# Patient Record
Sex: Male | Born: 1935 | Race: White | Hispanic: No | Marital: Married | Smoking: Former smoker
Health system: Southern US, Community
[De-identification: ages and names within clinical notes are randomized; demographics above are authoritative.]

## PROBLEM LIST (undated history)

## (undated) DIAGNOSIS — H269 Unspecified cataract: Secondary | ICD-10-CM

## (undated) DIAGNOSIS — R233 Spontaneous ecchymoses: Secondary | ICD-10-CM

## (undated) DIAGNOSIS — C801 Malignant (primary) neoplasm, unspecified: Secondary | ICD-10-CM

## (undated) DIAGNOSIS — J189 Pneumonia, unspecified organism: Secondary | ICD-10-CM

## (undated) DIAGNOSIS — N189 Chronic kidney disease, unspecified: Secondary | ICD-10-CM

## (undated) DIAGNOSIS — H9319 Tinnitus, unspecified ear: Secondary | ICD-10-CM

## (undated) DIAGNOSIS — T8859XA Other complications of anesthesia, initial encounter: Secondary | ICD-10-CM

## (undated) DIAGNOSIS — T4145XA Adverse effect of unspecified anesthetic, initial encounter: Secondary | ICD-10-CM

## (undated) DIAGNOSIS — N390 Urinary tract infection, site not specified: Secondary | ICD-10-CM

## (undated) DIAGNOSIS — Z860100 Personal history of colon polyps, unspecified: Secondary | ICD-10-CM

## (undated) DIAGNOSIS — K5900 Constipation, unspecified: Secondary | ICD-10-CM

## (undated) DIAGNOSIS — Z8739 Personal history of other diseases of the musculoskeletal system and connective tissue: Secondary | ICD-10-CM

## (undated) DIAGNOSIS — K5792 Diverticulitis of intestine, part unspecified, without perforation or abscess without bleeding: Secondary | ICD-10-CM

## (undated) DIAGNOSIS — R252 Cramp and spasm: Secondary | ICD-10-CM

## (undated) DIAGNOSIS — M1990 Unspecified osteoarthritis, unspecified site: Secondary | ICD-10-CM

## (undated) DIAGNOSIS — R238 Other skin changes: Secondary | ICD-10-CM

## (undated) DIAGNOSIS — H9190 Unspecified hearing loss, unspecified ear: Secondary | ICD-10-CM

## (undated) DIAGNOSIS — M2540 Effusion, unspecified joint: Secondary | ICD-10-CM

## (undated) DIAGNOSIS — R609 Edema, unspecified: Secondary | ICD-10-CM

## (undated) DIAGNOSIS — R600 Localized edema: Secondary | ICD-10-CM

## (undated) DIAGNOSIS — M2550 Pain in unspecified joint: Secondary | ICD-10-CM

## (undated) HISTORY — PX: PARTIAL COLECTOMY: SHX5273

## (undated) HISTORY — DX: Unspecified osteoarthritis, unspecified site: M19.90

## (undated) HISTORY — DX: Malignant (primary) neoplasm, unspecified: C80.1

## (undated) HISTORY — DX: Chronic kidney disease, unspecified: N18.9

## (undated) HISTORY — PX: HAND SURGERY: SHX662

## (undated) HISTORY — PX: COLONOSCOPY: SHX174

## (undated) HISTORY — PX: OTHER SURGICAL HISTORY: SHX169

---
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
## 1968-01-12 HISTORY — PX: HEMORRHOID SURGERY: SHX153

## 1998-12-16 ENCOUNTER — Encounter (INDEPENDENT_AMBULATORY_CARE_PROVIDER_SITE_OTHER): Payer: Self-pay | Admitting: *Deleted

## 1998-12-16 ENCOUNTER — Ambulatory Visit (HOSPITAL_COMMUNITY): Admission: RE | Admit: 1998-12-16 | Discharge: 1998-12-16 | Payer: Self-pay | Admitting: Gastroenterology

## 1999-04-29 ENCOUNTER — Encounter: Payer: Self-pay | Admitting: Orthopedic Surgery

## 1999-04-29 ENCOUNTER — Encounter: Admission: RE | Admit: 1999-04-29 | Discharge: 1999-04-29 | Payer: Self-pay | Admitting: Orthopedic Surgery

## 1999-07-24 ENCOUNTER — Encounter: Payer: Self-pay | Admitting: Gastroenterology

## 1999-07-24 ENCOUNTER — Ambulatory Visit (HOSPITAL_COMMUNITY): Admission: RE | Admit: 1999-07-24 | Discharge: 1999-07-24 | Payer: Self-pay | Admitting: Gastroenterology

## 1999-09-17 ENCOUNTER — Encounter: Admission: RE | Admit: 1999-09-17 | Discharge: 1999-09-17 | Payer: Self-pay | Admitting: Surgery

## 1999-09-17 ENCOUNTER — Encounter: Payer: Self-pay | Admitting: Surgery

## 1999-09-30 ENCOUNTER — Encounter (INDEPENDENT_AMBULATORY_CARE_PROVIDER_SITE_OTHER): Payer: Self-pay | Admitting: Specialist

## 1999-09-30 ENCOUNTER — Inpatient Hospital Stay (HOSPITAL_COMMUNITY): Admission: RE | Admit: 1999-09-30 | Discharge: 1999-10-10 | Payer: Self-pay | Admitting: Surgery

## 1999-10-06 ENCOUNTER — Encounter: Payer: Self-pay | Admitting: Surgery

## 2000-07-20 ENCOUNTER — Encounter: Admission: RE | Admit: 2000-07-20 | Discharge: 2000-07-20 | Payer: Self-pay | Admitting: Gastroenterology

## 2000-07-20 ENCOUNTER — Encounter: Payer: Self-pay | Admitting: Gastroenterology

## 2000-07-25 ENCOUNTER — Encounter: Admission: RE | Admit: 2000-07-25 | Discharge: 2000-07-25 | Payer: Self-pay | Admitting: Gastroenterology

## 2000-07-25 ENCOUNTER — Encounter: Payer: Self-pay | Admitting: Gastroenterology

## 2001-01-11 DIAGNOSIS — K5792 Diverticulitis of intestine, part unspecified, without perforation or abscess without bleeding: Secondary | ICD-10-CM

## 2001-01-11 HISTORY — DX: Diverticulitis of intestine, part unspecified, without perforation or abscess without bleeding: K57.92

## 2001-07-10 ENCOUNTER — Ambulatory Visit (HOSPITAL_COMMUNITY): Admission: RE | Admit: 2001-07-10 | Discharge: 2001-07-10 | Payer: Self-pay | Admitting: Gastroenterology

## 2001-07-10 ENCOUNTER — Encounter: Payer: Self-pay | Admitting: Gastroenterology

## 2001-08-04 ENCOUNTER — Emergency Department (HOSPITAL_COMMUNITY): Admission: EM | Admit: 2001-08-04 | Discharge: 2001-08-04 | Payer: Self-pay | Admitting: Emergency Medicine

## 2001-08-07 ENCOUNTER — Encounter: Admission: RE | Admit: 2001-08-07 | Discharge: 2001-08-07 | Payer: Self-pay | Admitting: Urology

## 2001-08-07 ENCOUNTER — Encounter: Payer: Self-pay | Admitting: Urology

## 2001-12-12 ENCOUNTER — Ambulatory Visit (HOSPITAL_COMMUNITY): Admission: RE | Admit: 2001-12-12 | Discharge: 2001-12-12 | Payer: Self-pay | Admitting: Gastroenterology

## 2001-12-12 ENCOUNTER — Encounter (INDEPENDENT_AMBULATORY_CARE_PROVIDER_SITE_OTHER): Payer: Self-pay | Admitting: *Deleted

## 2003-01-12 DIAGNOSIS — C801 Malignant (primary) neoplasm, unspecified: Secondary | ICD-10-CM

## 2003-01-12 HISTORY — DX: Malignant (primary) neoplasm, unspecified: C80.1

## 2003-09-25 ENCOUNTER — Ambulatory Visit (HOSPITAL_COMMUNITY): Admission: RE | Admit: 2003-09-25 | Discharge: 2003-09-25 | Payer: Self-pay | Admitting: Urology

## 2003-09-30 ENCOUNTER — Ambulatory Visit: Admission: RE | Admit: 2003-09-30 | Discharge: 2003-12-29 | Payer: Self-pay | Admitting: Radiation Oncology

## 2003-10-07 ENCOUNTER — Ambulatory Visit (HOSPITAL_COMMUNITY): Admission: RE | Admit: 2003-10-07 | Discharge: 2003-10-07 | Payer: Self-pay | Admitting: Urology

## 2003-12-27 ENCOUNTER — Encounter: Admission: RE | Admit: 2003-12-27 | Discharge: 2003-12-27 | Payer: Self-pay | Admitting: Urology

## 2003-12-31 ENCOUNTER — Ambulatory Visit (HOSPITAL_BASED_OUTPATIENT_CLINIC_OR_DEPARTMENT_OTHER): Admission: RE | Admit: 2003-12-31 | Discharge: 2003-12-31 | Payer: Self-pay | Admitting: Urology

## 2003-12-31 ENCOUNTER — Ambulatory Visit (HOSPITAL_COMMUNITY): Admission: RE | Admit: 2003-12-31 | Discharge: 2003-12-31 | Payer: Self-pay | Admitting: Urology

## 2004-01-08 ENCOUNTER — Ambulatory Visit: Admission: RE | Admit: 2004-01-08 | Discharge: 2004-01-09 | Payer: Self-pay | Admitting: Radiation Oncology

## 2007-08-11 ENCOUNTER — Encounter: Admission: RE | Admit: 2007-08-11 | Discharge: 2007-08-11 | Payer: Self-pay | Admitting: Gastroenterology

## 2008-03-20 ENCOUNTER — Encounter (INDEPENDENT_AMBULATORY_CARE_PROVIDER_SITE_OTHER): Payer: Self-pay | Admitting: Surgery

## 2008-03-20 ENCOUNTER — Inpatient Hospital Stay (HOSPITAL_COMMUNITY): Admission: RE | Admit: 2008-03-20 | Discharge: 2008-03-26 | Payer: Self-pay | Admitting: Surgery

## 2010-02-01 ENCOUNTER — Encounter: Payer: Self-pay | Admitting: Urology

## 2010-04-23 LAB — COMPREHENSIVE METABOLIC PANEL
ALT: 25 U/L (ref 0–53)
AST: 20 U/L (ref 0–37)
Albumin: 4 g/dL (ref 3.5–5.2)
Alkaline Phosphatase: 43 U/L (ref 39–117)
BUN: 19 mg/dL (ref 6–23)
CO2: 27 mEq/L (ref 19–32)
Calcium: 9.3 mg/dL (ref 8.4–10.5)
Chloride: 106 mEq/L (ref 96–112)
Creatinine, Ser: 1.44 mg/dL (ref 0.4–1.5)
GFR calc Af Amer: 58 mL/min — ABNORMAL LOW (ref >60)
GFR calc non Af Amer: 48 mL/min — ABNORMAL LOW (ref >60)
Glucose, Bld: 114 mg/dL — ABNORMAL HIGH (ref 70–99)
Potassium: 3.8 mEq/L (ref 3.5–5.1)
Sodium: 137 mEq/L (ref 135–145)
Total Bilirubin: 0.8 mg/dL (ref 0.3–1.2)
Total Protein: 6.9 g/dL (ref 6.0–8.3)

## 2010-04-23 LAB — CBC
HCT: 44.8 % (ref 39.0–52.0)
Hemoglobin: 15.1 g/dL (ref 13.0–17.0)
MCHC: 33.6 g/dL (ref 30.0–36.0)
MCV: 90.5 fL (ref 78.0–100.0)
Platelets: 252 10*3/uL (ref 150–400)
RBC: 4.95 MIL/uL (ref 4.22–5.81)
RDW: 13.2 % (ref 11.5–15.5)
WBC: 6.9 10*3/uL (ref 4.0–10.5)

## 2010-04-23 LAB — DIFFERENTIAL
Basophils Absolute: 0 10*3/uL (ref 0.0–0.1)
Basophils Relative: 0 % (ref 0–1)
Eosinophils Absolute: 0.1 10*3/uL (ref 0.0–0.7)
Eosinophils Relative: 2 % (ref 0–5)
Lymphocytes Relative: 40 % (ref 12–46)
Lymphs Abs: 2.7 10*3/uL (ref 0.7–4.0)
Monocytes Absolute: 0.6 10*3/uL (ref 0.1–1.0)
Monocytes Relative: 8 % (ref 3–12)
Neutro Abs: 3.5 10*3/uL (ref 1.7–7.7)
Neutrophils Relative %: 50 % (ref 43–77)

## 2010-04-23 LAB — URINALYSIS, ROUTINE W REFLEX MICROSCOPIC
Bilirubin Urine: NEGATIVE
Glucose, UA: NEGATIVE mg/dL
Hgb urine dipstick: NEGATIVE
Ketones, ur: NEGATIVE mg/dL
Nitrite: NEGATIVE
Protein, ur: NEGATIVE mg/dL
Specific Gravity, Urine: 1.015 (ref 1.005–1.030)
Urobilinogen, UA: 0.2 mg/dL (ref 0.0–1.0)
pH: 5 (ref 5.0–8.0)

## 2010-05-26 NOTE — Op Note (Signed)
NAMEJOHNPAUL, GILLENTINE                ACCOUNT NO.:  [id number]   MEDICAL RECORD NO.:  [id number]          PATIENT TYPE:  INP   LOCATION:  0009                         FACILITY:  [facility]   PHYSICIAN:  Currie Paris, M.D.DATE OF BIRTH:  1928-11-15   DATE OF PROCEDURE:  03/20/2008  DATE OF DISCHARGE:                               OPERATIVE REPORT   PREOPERATIVE DIAGNOSIS:  Polyp in ascending colon.   POSTOPERATIVE DIAGNOSIS:  Polyp in ascending colon.   OPERATION:  Resection of terminal ileum and ascending colon with primary  anastomosis.   SURGEON:  Currie Paris, M.D.   ASSISTANT:  Dr. Harriette Bouillon.   ANESTHESIA:  General endotracheal.   CLINICAL HISTORY:  Mr. Tosh is a 77 year old gentleman who on  colonoscopy have a large polyp of the cecum that was not able to be  removed colonoscopically.  Biopsy indicated it to be benign.  After  discussion with the patient and with Dr. Sherin Quarry, his physician, we  elected to proceed to a right colectomy.   DESCRIPTION OF PROCEDURE:  The patient was seen in the holding area and  he had no further questions.  We confirmed removal of the cecal area as  the planned procedure.   He was taken to the operating room and after satisfactory general  endotracheal anesthesia had been obtained, Foley catheter was placed and  the abdomen was clipped, prepped and draped.  The time-out was done.   The patient had an old prior midline incision that was basically from  xiphoid to pubis so I elected to make a right midabdomen transverse  incision.  The subcu tissue and muscle were divided with cautery and the  peritoneum picked up and entered.  There no significant adhesions and we  had free entry into the peritoneal cavity.   I extended the incision a little bit laterally to get a little bit more  exposure, since the patient is somewhat large.  I was then able to  identify the cecum and with some retraction, opened up the white line of  Toldt and mobilize the cecum up into the wound.  I continued up to  mobilize little bit of the hepatic flexure but my plan was just to do a  cecal resection in the end of the terminal ileum so that we could do a  primary anastomosis since this appeared to be benign and appeared to be  located near the ileocecal valve.   I used the LigaSure to divide some of the attachments to mobilize the  right colon and then mobilized the cecum and there some adhesions of the  terminal ileum which were divided.  With the colon retracted up into the  wound I had plenty of mobility to do an anastomosis.  I identified the  right ureter made sure it was well posterior to where we were working.   I selected a point to divide the small bowel just proximal to the  ileocecal valve and a point to develop the ascending colon about the mid  ascending colon.   Using the LigaSure, I divided the intervening  mesentery staying fairly  close to the colon and taking small bites.   Once that was done, I was able to line up the antimesenteric border of  the small bowel with the tinea on the colon.  I opened both and put the  GIA in and fired that.  There was no bleeding at the anastomosis.   The TA-60 was used to come across all layers to close the common defect.  The colon was cut off.   I used a few 3-0 Vicryls to invert the TA staple line.  I anchored the  end of the anastomosis crotch stitch with a suture.  I closed the  mesentery some with 3-0 Vicryl.   I opened the specimen off on the back table and confirmed the presence  of about 2.5 to 3 cm flat sessile polyp adjacent to the ileocecal valve  near the area where the appendiceal lumen would be.   Using new instruments, gloves etc., I went back and irrigated, made sure  everything was dry.  The bowel was carefully replaced in the abdominal  cavity.  Incision was closed in layers with #1 PDS running.  We  irrigated between layers.  The skin was closed with  staples and I used  some Telfa wicks as well.   The patient tolerated the procedure well.  Estimated blood loss was less  than 100 mL.  There no operative complications.  All counts were  correct.      Currie Paris, M.D.  Electronically Signed     CJS/MEDQ  D:  03/20/2008  T:  03/21/2008  Job:  [id number]   cc:   Tasia Catchings, M.D.  Fax: [phone]

## 2010-05-29 NOTE — Discharge Summary (Signed)
[facility]  Patient:    Christopher [name], Christopher [name]                         MRN: [id number] Adm. Date:  [id number] Disc. Date: [id number] Attending:  Charlton Haws CC:         Genene Churn. Sherin Quarry, M.D.   Discharge Summary  FINAL DIAGNOSES: 1. Chronic diverticulitis of the sigmoid colon. 2. Superficial thrombophlebitis of upper extremity. 3. Obesity.  CLINICAL HISTORY:  This patient is a 77 year old gentleman whose had multiple episodes of diverticulitis admitted electively for sigmoid colectomy.  HOSPITAL COURSE:  The patient was admitted and taken to the operating room on September 19 where he had resection of most of his descending and sigmoid colon with a primary anastomosis. He tolerated the procedure well. He remained stable except for the low-grade fever thought secondary to pulmonary but was minimal. He was a little slow opening up secondary to the significant amount of resection that was done I think and was begun on Reglan on the fifth postoperative day. His laboratory studies remained unremarkable and he finally began passing some liquid. He developed a superficial phlebitis at an IV site and his antecubital area and was begun on some warm soaks and Ancef but this never proved to be any significant problem. By the seventh postoperative day, he began having bowel movements. NG was out and he was started on liquids on the eighth postoperative day, advanced his diet and was discharged on September 29 feeling well, afebrile, wound healing. He was able to be discharged on Vicodin for pain to return to the office in 10-14 days with limited activities.  Pathology report showed diverticulitis with focal and mid chronic diverticulitis.  Laboratory studies included a preoperative hemoglobin of 14.8 and postoperative 13.4. Electrolytes were unremarkable. Albumin was 3.8 on admission. Liver functions were normal. Postoperative chest x-ray was unremarkable  and abdominal films showed no significant obstructive pattern done when he was a little slow opening up. DD:  10/26/99 TD:  10/26/99 Job: [id number]

## 2010-05-29 NOTE — Discharge Summary (Signed)
Christopher [name], Christopher [name]                ACCOUNT NO.:  [id number]   MEDICAL RECORD NO.:  [id number]          PATIENT TYPE:  INP   LOCATION:  1526                         FACILITY:  [facility]   PHYSICIAN:  Currie Paris, M.D.DATE OF BIRTH:  Aug 29, 1928   DATE OF ADMISSION:  03/20/2008  DATE OF DISCHARGE:  03/26/2008                               DISCHARGE SUMMARY   FINAL DIAGNOSES:  1. Tubovillous adenoma of cecum.  2. Morbid obesity.   CLINICAL HISTORY:  [name] is a  77 year old gentleman who recently  had a colonoscopy demonstrating a large polyp of the cecum thought to be  benign.  He has had a previous left colectomy for diverticular disease.  The polyp was too large to be removed colonoscopically.   HOSPITAL COURSE:  The patient was admitted on March 20, 2008, and taken  to the operating room where a resection of the terminal ileum and  ascending colon with a primary anastomosis was performed.  Postoperatively, he had a relatively benign course.  He began passing  gas and had had 2 bowel movements by the 4th postoperative day.  His  wound was clean and some of the wicks were removed that we had placed.  He continued to have a little bit of nausea later that day but by  postoperative day 6 was feeling better and able to be discharged.  He  was having some loose stools.  His abdomen was soft and nontender.  His  wounds looked okay.  Pathology report was noted and had been discussed  with the patient prior to discharge.   Postoperatively, he will be followed up in our office.  He will also  make long-term followups with Dr. Tasia Catchings.   OTHER LABORATORY STUDIES NOTED THIS ADMISSION:  Included an admission  hemoglobin of 15 with a white count of 6900.  Blood chemistries were  normal.  Nonfasting glucose was 114.  Urinalysis was clear.  Preoperative chest x-ray was likewise negative.      Currie Paris, M.D.  Electronically Signed     CJS/MEDQ  D:  04/10/2008   T:  04/10/2008  Job:  [id number]   cc:   Tasia Catchings, M.D.  Fax: [phone]

## 2010-05-29 NOTE — Op Note (Signed)
[name], [name]                ACCOUNT NO.:  [id number]   MEDICAL RECORD NO.:  [id number]          PATIENT TYPE:  AMB   LOCATION:  NESC                         FACILITY:  [facility]   PHYSICIAN:  Jamison Neighbor, M.D.  DATE OF BIRTH:  August 16, 1928   DATE OF PROCEDURE:  12/31/2003  DATE OF DISCHARGE:                                 OPERATIVE REPORT   SERVICE:  Urology.   PREOPERATIVE DIAGNOSES:  Carcinoma of the prostate.   POSTOPERATIVE DIAGNOSES:  Carcinoma of the prostate.   PROCEDURE:  Radioactive seed implantation.   SURGEON:  Jamison Neighbor, M.D.   RADIATION ONCOLOGIST:  Wynn Banker, M.D.   ANESTHESIA:  General.   COMPLICATIONS:  None.   DRAINS:  16 French Foley catheter.   BRIEF HISTORY:  This 77 year old male is known to have biopsy proven  carcinoma of the prostate. The patient has had a Gleason score of 7  involving the right hand side of the prostate, the left hand side was  normal.  Some focal __________ invasion was noted. This involved 60% of the  biopsy specimen. Because this was relatively extensive disease and a Gleason  score of 7, he was staged further with a CT scan and a bone scan. This  showed no evidence of any local disease or distant metastatic disease and it  was felt that the patient was a candidate for potentially curative therapy.  The patient was given the pros and cons of treatment and discussion included  observation, radiation therapy, radiation seed implantation, combination  therapy, surgery or cryosurgery. The patient had a chance to review all the  options and elected to undergo radiation therapy.  A decision was made to  proceed with combined external beam approach followed now by radiation seed  implantation.  The patient is scheduled to have a total of 64 seeds inserted  with a targeted dose of 8000 cGy.  I125 seeds have been selected. The  patient has been fully prepared for the procedure.  He gave full and  informed  consent.   DESCRIPTION OF PROCEDURE:  After successful induction of general anesthesia,  the patient was placed in the dorsal lithotomy position, prepped with  Betadine and draped in the usual sterile fashion.  The genitalia were taped  up and away from the perineum.  The transrectal ultrasound was inserted and  was attached to the step system. The previously obtained ultrasound images  were then utilized to ensure that the patient was appropriately positioned  into the exact same position.  The template was modified based on the  radiation concentrations obtained on the computerized system, minor  modifications were made by Dr. Dan Humphreys. The patient then underwent  insertion of a total of 16 needles with a total 64 seeds.  These were placed  using both ultrasound and fluoroscopic guidance. The patient subsequently  underwent a cystoscopic evaluation.  His urethra was visualized in its  entirety and found to be normal.  Beyond the verumontanum, there was  prostatic enlargement but no real obstruction of the prostatic urethra.  The  urethra and the  prostate itself were inspected with both 12 degree and 7  degree lenses.  The ureters were identified and were normal.  There was no  evidence of any injury to the bladder and certainly no signs of any seeds or  Vicryl strands anywhere within the bladder. The followup x-ray image showed  good placement of the seeds but no evidence of any seeds in the area of the  bladder. The ultrasound image also showed what appeared to be good placement  throughout the prostate with good sparing of the urethra. The patient's  Foley catheter was placed to straight drainage, he tolerated the procedure  well and was taken to the recovery room in good condition.      RJE/MEDQ  D:  12/31/2003  T:  12/31/2003  Job:  [id number]   cc:   Tasia Catchings, M.D.  [address]  [place]  [place] [address]  Fax: [phone]   Wynn Banker, M.D.  [address] [facility]  [place]  [place] [address]  Fax: [phone]

## 2010-05-29 NOTE — Op Note (Signed)
[facility]  Patient:    Christopher [name], Christopher [name]                         MRN: [id number] Proc. Date: 09/30/99 Adm. Date:  [id number] Attending:  Charlton Haws CC:         Genene Churn. Sherin Quarry, M.D.                           Operative Report  OFFICE MEDICAL NUMBER:  [id number]  PREOPERATIVE DIAGNOSIS:  Diverticular disease of descending and sigmoid colon with prior episodes of diverticulitis.  POSTOPERATIVE DIAGNOSIS:  Diverticular disease of descending and sigmoid colon with prior episodes of diverticulitis.  OPERATION:  Resection of descending and sigmoid colon with primary transverse colon to rectal anastomosis.  SURGEON:  Currie Paris, M.D.  ASSISTANT:  Gita Kudo, M.D.  ANESTHESIA:  General endotracheal.  CLINICAL HISTORY:  This patient has had several episodes of diverticular disease treated with antibiotics.  Barium enema has confirmed disease extending well up into the descending colon.  After discussion with the patient and also with Dr. Sherin Quarry, his gastroenterologist, he elected to proceed to sigmoid and descending colectomy.  DESCRIPTION OF PROCEDURE:  The patient was brought to the operating room and after satisfactory general endotracheal anesthesia had been obtained, the abdomen was prepped and draped.  A long midline incision was made from mid epigastrium to symphysis and the peritoneal cavity entered.  The sigmoid was markedly involved with diverticular disease, and this appeared to extend up into the descending colon confirming the barium enema results.  It was clear I was not going to be able to get this down without taking down the splenic flexure, and so I made plans to mobilize the splenic flexure.  The patient is quite large, and I elected to go ahead and open the incision further, extending it up close to the xiphoid.  A self-retaining Bookwalter retractor was used as well as a Environmental manager.  The descending colon was  mobilized, freeing up along the white line of Toldt and then freeing some of the perineal attachments coming around the splenic flexure.  I got about 2/3 of the way around the splenic flexure and was having difficulty with visualization, so I stopped, turned my attention back towards the left transverse colon.  The omentum was taken off the left transverse colon, and I began then to free the left transverse colon up, heading up towards the splenic flexure so that I could approach the splenic flexure from both the transverse colon and the descending colon.  Using clips, cautery, and ties, I eventually freed up the entire splenic flexure and got it mobilized well down so that it was in the wound easily.  I elected to divide the colon from the proximal point in the distal transverse where I had an area that appeared to be clean and free of diverticular disease and also which would go down towards the rectum easily and without tension.  I divided that initially with the TIA.  At this point, I then divided the colon mesentery, staying fairly close to the colon and using Kelly clamps and ties, working my way down to the proximal rectum.  I stayed well out of the retroperitoneum and did not dissect out the ureters, as I really did not get into that area whatsoever.  I came up on the colon and had a good area to do the  anastomosis here and thought it was fairly dilated, and I would have a relatively easy time doing an end-to-end stapled anastomosis.  I divided the colon here between bowel clamps and sent the specimen off.  I then stopped and made sure that everything appeared to be dry up in the left side of the abdomen and around the splenic flexure, irrigated out, and found a few points that were oozing slowly that were cauterized, but no significant bleeding identified.  I left a couple of packs in the left upper quadrant while I proceeded with the anastomosis.  The two ends of the bowel  overlapped by several inches so there would be no tension on the anastomosis.  I cleaned off about 2-3 more cm of the transverse colon to get a little better area freed up to do the anastomosis and then divided the colon again with a ______ bowel clamp.  The two ends came together nicely.  A stapled anastomosis was done.  I took the clamps off and used some 3-0 silks to tack the back walls together, keeping the mesentery to the mesentery for proper orientation.  The TA 60 stapler and 3.5 mm stapler was fired to complete the back row, and this appeared to be intact.  Two additional firings were used to complete the front rows, dividing the area into two segments.  This completed a nice, widely patent anastomosis that appeared to be dry and intact.  Gloves and instruments were changed here.  I then re-irrigated and checked the whole left side again for bleeders to make sure where we took the mesentery down and up by the splenic flexure, everything appeared to be dry, and there was no sign of any bleeding, and the packs were essentially dry, showing no evidence of any active bleeding during the period of time we were doing the anastomosis.  Packs were removed, and the colon laid along the lateral gutter, and the mesentery tacked down posteriorly so there would no place for an internal hernia to develop.  The NG was positioned in the stomach.  The small bowel had appeared to be normal, as it was run at the beginning of the case, and the liver and gallbladder appeared to be normal as did the spleen.  No other abnormalities being noted, the abdomen was then closed.  I used a #1 PDS to close the peritoneum below the umbilicus and then a running 0 Prolene double loop suture to close the fascia.  The wound was irrigated again and the skin closed with staples.  The patient tolerated the procedure well.  There were no complications.  Operative blood loss was approximately 500 cc. DD:   09/30/99 TD:  10/01/99 Job: [id number]

## 2010-08-10 ENCOUNTER — Other Ambulatory Visit: Payer: Self-pay | Admitting: Gastroenterology

## 2010-08-12 HISTORY — PX: TOTAL KNEE ARTHROPLASTY: SHX125

## 2010-08-13 ENCOUNTER — Ambulatory Visit (HOSPITAL_COMMUNITY)
Admission: RE | Admit: 2010-08-13 | Discharge: 2010-08-13 | Disposition: A | Discharge status: Home / Self Care | Payer: Medicare Other | Source: Ambulatory Visit | Attending: Orthopedic Surgery | Admitting: Orthopedic Surgery

## 2010-08-13 ENCOUNTER — Encounter (HOSPITAL_COMMUNITY): Payer: Medicare Other

## 2010-08-13 ENCOUNTER — Other Ambulatory Visit: Payer: Self-pay | Admitting: Orthopedic Surgery

## 2010-08-13 ENCOUNTER — Other Ambulatory Visit (HOSPITAL_COMMUNITY): Payer: Self-pay | Admitting: Orthopedic Surgery

## 2010-08-13 DIAGNOSIS — Z01818 Encounter for other preprocedural examination: Secondary | ICD-10-CM | POA: Insufficient documentation

## 2010-08-13 DIAGNOSIS — M1710 Unilateral primary osteoarthritis, unspecified knee: Secondary | ICD-10-CM | POA: Insufficient documentation

## 2010-08-13 DIAGNOSIS — Z01811 Encounter for preprocedural respiratory examination: Secondary | ICD-10-CM

## 2010-08-13 DIAGNOSIS — I700 Atherosclerosis of aorta: Secondary | ICD-10-CM | POA: Insufficient documentation

## 2010-08-13 DIAGNOSIS — M47814 Spondylosis without myelopathy or radiculopathy, thoracic region: Secondary | ICD-10-CM | POA: Insufficient documentation

## 2010-08-13 DIAGNOSIS — Z01810 Encounter for preprocedural cardiovascular examination: Secondary | ICD-10-CM | POA: Insufficient documentation

## 2010-08-13 DIAGNOSIS — Z01812 Encounter for preprocedural laboratory examination: Secondary | ICD-10-CM | POA: Insufficient documentation

## 2010-08-13 LAB — COMPREHENSIVE METABOLIC PANEL
ALT: 21 U/L (ref 0–53)
AST: 18 U/L (ref 0–37)
Albumin: 4 g/dL (ref 3.5–5.2)
BUN: 22 mg/dL (ref 6–23)
CO2: 31 mEq/L (ref 19–32)
Calcium: 10.2 mg/dL (ref 8.4–10.5)
Chloride: 102 mEq/L (ref 96–112)
GFR calc Af Amer: 53 mL/min — ABNORMAL LOW (ref >60)
GFR calc non Af Amer: 43 mL/min — ABNORMAL LOW (ref >60)
Glucose, Bld: 95 mg/dL (ref 70–99)
Potassium: 3.4 mEq/L — ABNORMAL LOW (ref 3.5–5.1)
Sodium: 139 mEq/L (ref 135–145)
Total Bilirubin: 0.4 mg/dL (ref 0.3–1.2)
Total Protein: 7.4 g/dL (ref 6.0–8.3)

## 2010-08-13 LAB — DIFFERENTIAL
Basophils Relative: 1 % (ref 0–1)
Eosinophils Absolute: 0.1 10*3/uL (ref 0.0–0.7)
Eosinophils Relative: 2 % (ref 0–5)
Lymphs Abs: 3 10*3/uL (ref 0.7–4.0)
Monocytes Absolute: 0.7 10*3/uL (ref 0.1–1.0)
Monocytes Relative: 9 % (ref 3–12)
Neutro Abs: 3.8 10*3/uL (ref 1.7–7.7)
Neutrophils Relative %: 50 % (ref 43–77)

## 2010-08-13 LAB — CBC
HCT: 43.6 % (ref 39.0–52.0)
Hemoglobin: 14.6 g/dL (ref 13.0–17.0)
MCH: 29.6 pg (ref 26.0–34.0)
MCHC: 33.5 g/dL (ref 30.0–36.0)
MCV: 88.4 fL (ref 78.0–100.0)
Platelets: 235 10*3/uL (ref 150–400)
RBC: 4.93 MIL/uL (ref 4.22–5.81)
RDW: 12.7 % (ref 11.5–15.5)

## 2010-08-13 LAB — PROTIME-INR
INR: 1.03 (ref 0.00–1.49)
Prothrombin Time: 13.7 seconds (ref 11.6–15.2)

## 2010-08-13 LAB — URINALYSIS, ROUTINE W REFLEX MICROSCOPIC
Bilirubin Urine: NEGATIVE
Glucose, UA: NEGATIVE mg/dL
Hgb urine dipstick: NEGATIVE
Ketones, ur: NEGATIVE mg/dL
Leukocytes, UA: NEGATIVE
Protein, ur: NEGATIVE mg/dL
Specific Gravity, Urine: 1.017 (ref 1.005–1.030)
pH: 5.5 (ref 5.0–8.0)

## 2010-08-13 LAB — SURGICAL PCR SCREEN
MRSA, PCR: NEGATIVE
Staphylococcus aureus: NEGATIVE

## 2010-08-13 LAB — ABO/RH: ABO/RH(D): A POS

## 2010-08-13 LAB — APTT: aPTT: 48 seconds — ABNORMAL HIGH (ref 24–37)

## 2010-08-20 ENCOUNTER — Inpatient Hospital Stay (HOSPITAL_COMMUNITY)
Admission: RE | Admit: 2010-08-20 | Discharge: 2010-08-28 | DRG: 470 | Disposition: A | Discharge status: Home Health | Payer: Medicare Other | Source: Ambulatory Visit | Attending: Orthopedic Surgery | Admitting: Orthopedic Surgery

## 2010-08-20 DIAGNOSIS — D62 Acute posthemorrhagic anemia: Secondary | ICD-10-CM | POA: Diagnosis not present

## 2010-08-20 DIAGNOSIS — Z880 Allergy status to penicillin: Secondary | ICD-10-CM

## 2010-08-20 DIAGNOSIS — Z860100 Personal history of colon polyps, unspecified: Secondary | ICD-10-CM

## 2010-08-20 DIAGNOSIS — Z6841 Body Mass Index (BMI) 40.0 and over, adult: Secondary | ICD-10-CM

## 2010-08-20 DIAGNOSIS — M1710 Unilateral primary osteoarthritis, unspecified knee: Principal | ICD-10-CM | POA: Diagnosis present

## 2010-08-20 DIAGNOSIS — Z87891 Personal history of nicotine dependence: Secondary | ICD-10-CM

## 2010-08-20 DIAGNOSIS — Z9049 Acquired absence of other specified parts of digestive tract: Secondary | ICD-10-CM

## 2010-08-20 DIAGNOSIS — Z79899 Other long term (current) drug therapy: Secondary | ICD-10-CM

## 2010-08-20 DIAGNOSIS — I129 Hypertensive chronic kidney disease with stage 1 through stage 4 chronic kidney disease, or unspecified chronic kidney disease: Secondary | ICD-10-CM | POA: Diagnosis present

## 2010-08-20 DIAGNOSIS — K5730 Diverticulosis of large intestine without perforation or abscess without bleeding: Secondary | ICD-10-CM | POA: Diagnosis present

## 2010-08-20 DIAGNOSIS — N183 Chronic kidney disease, stage 3 unspecified: Secondary | ICD-10-CM | POA: Diagnosis present

## 2010-08-20 DIAGNOSIS — Z01812 Encounter for preprocedural laboratory examination: Secondary | ICD-10-CM

## 2010-08-20 DIAGNOSIS — K560 Paralytic ileus: Secondary | ICD-10-CM | POA: Diagnosis not present

## 2010-08-20 DIAGNOSIS — R110 Nausea: Secondary | ICD-10-CM | POA: Diagnosis not present

## 2010-08-20 LAB — TYPE AND SCREEN
ABO/RH(D): A POS
Antibody Screen: NEGATIVE

## 2010-08-21 LAB — CBC
HCT: 38.5 % — ABNORMAL LOW (ref 39.0–52.0)
Hemoglobin: 13.2 g/dL (ref 13.0–17.0)
MCH: 30.3 pg (ref 26.0–34.0)
MCHC: 34.3 g/dL (ref 30.0–36.0)
MCV: 88.5 fL (ref 78.0–100.0)
Platelets: 209 10*3/uL (ref 150–400)
RBC: 4.35 MIL/uL (ref 4.22–5.81)
RDW: 12.8 % (ref 11.5–15.5)

## 2010-08-21 LAB — BASIC METABOLIC PANEL
BUN: 12 mg/dL (ref 6–23)
CO2: 30 mEq/L (ref 19–32)
Calcium: 8.9 mg/dL (ref 8.4–10.5)
Chloride: 102 mEq/L (ref 96–112)
Creatinine, Ser: 1.35 mg/dL (ref 0.50–1.35)
GFR calc Af Amer: >60 mL/min (ref >60)
GFR calc non Af Amer: 51 mL/min — ABNORMAL LOW (ref >60)
Glucose, Bld: 119 mg/dL — ABNORMAL HIGH (ref 70–99)
Potassium: 4 mEq/L (ref 3.5–5.1)
Sodium: 138 mEq/L (ref 135–145)

## 2010-08-22 ENCOUNTER — Inpatient Hospital Stay (HOSPITAL_COMMUNITY): Payer: Medicare Other

## 2010-08-22 LAB — BASIC METABOLIC PANEL
BUN: 11 mg/dL (ref 6–23)
Calcium: 8.8 mg/dL (ref 8.4–10.5)
Chloride: 101 mEq/L (ref 96–112)
Creatinine, Ser: 1.33 mg/dL (ref 0.50–1.35)
GFR calc Af Amer: >60 mL/min (ref >60)
GFR calc non Af Amer: 52 mL/min — ABNORMAL LOW (ref >60)
Glucose, Bld: 101 mg/dL — ABNORMAL HIGH (ref 70–99)
Potassium: 3.6 mEq/L (ref 3.5–5.1)
Sodium: 135 mEq/L (ref 135–145)

## 2010-08-22 LAB — CBC
HCT: 37.4 % — ABNORMAL LOW (ref 39.0–52.0)
Hemoglobin: 12.5 g/dL — ABNORMAL LOW (ref 13.0–17.0)
MCH: 29.8 pg (ref 26.0–34.0)
MCHC: 33.4 g/dL (ref 30.0–36.0)
MCV: 89.3 fL (ref 78.0–100.0)
RBC: 4.19 MIL/uL — ABNORMAL LOW (ref 4.22–5.81)
RDW: 12.7 % (ref 11.5–15.5)
WBC: 10.6 10*3/uL — ABNORMAL HIGH (ref 4.0–10.5)

## 2010-08-23 LAB — CBC
MCH: 29.7 pg (ref 26.0–34.0)
MCHC: 34.1 g/dL (ref 30.0–36.0)
MCV: 87 fL (ref 78.0–100.0)
Platelets: 246 10*3/uL (ref 150–400)
RDW: 12.6 % (ref 11.5–15.5)

## 2010-08-24 ENCOUNTER — Inpatient Hospital Stay (HOSPITAL_COMMUNITY): Payer: Medicare Other

## 2010-08-25 ENCOUNTER — Encounter (HOSPITAL_COMMUNITY): Payer: Self-pay | Admitting: Radiology

## 2010-08-25 ENCOUNTER — Inpatient Hospital Stay (HOSPITAL_COMMUNITY): Payer: Medicare Other

## 2010-08-25 DIAGNOSIS — K56609 Unspecified intestinal obstruction, unspecified as to partial versus complete obstruction: Secondary | ICD-10-CM

## 2010-08-25 LAB — CBC
MCHC: 34.4 g/dL (ref 30.0–36.0)
Platelets: 307 10*3/uL (ref 150–400)
RDW: 12.6 % (ref 11.5–15.5)
WBC: 8.1 10*3/uL (ref 4.0–10.5)

## 2010-08-25 LAB — COMPREHENSIVE METABOLIC PANEL
ALT: 17 U/L (ref 0–53)
AST: 18 U/L (ref 0–37)
Albumin: 3 g/dL — ABNORMAL LOW (ref 3.5–5.2)
Alkaline Phosphatase: 55 U/L (ref 39–117)
BUN: 30 mg/dL — ABNORMAL HIGH (ref 6–23)
Potassium: 3.7 mEq/L (ref 3.5–5.1)
Sodium: 137 mEq/L (ref 135–145)
Total Protein: 6.9 g/dL (ref 6.0–8.3)

## 2010-08-25 MED ORDER — IOHEXOL 300 MG/ML  SOLN
100.0000 mL | Freq: Once | INTRAMUSCULAR | Status: AC | PRN
  Administered 2010-08-25: 100 mL via INTRAVENOUS

## 2010-08-26 LAB — COMPREHENSIVE METABOLIC PANEL
Albumin: 2.6 g/dL — ABNORMAL LOW (ref 3.5–5.2)
BUN: 29 mg/dL — ABNORMAL HIGH (ref 6–23)
Calcium: 8.7 mg/dL (ref 8.4–10.5)
Chloride: 101 mEq/L (ref 96–112)
Creatinine, Ser: 1.36 mg/dL — ABNORMAL HIGH (ref 0.50–1.35)
GFR calc non Af Amer: 51 mL/min — ABNORMAL LOW (ref >60)
Total Bilirubin: 1 mg/dL (ref 0.3–1.2)

## 2010-08-26 LAB — CBC
HCT: 36.6 % — ABNORMAL LOW (ref 39.0–52.0)
MCH: 29.9 pg (ref 26.0–34.0)
MCV: 89.1 fL (ref 78.0–100.0)
RDW: 12.5 % (ref 11.5–15.5)
WBC: 6.3 10*3/uL (ref 4.0–10.5)

## 2010-08-27 NOTE — Op Note (Signed)
Christopher [name], Christopher [name]                ACCOUNT NO.:  [id number]  MEDICAL RECORD NO.:  [id number]  LOCATION:  1607                         FACILITY:  [facility]  PHYSICIAN:  Addisynn Vassell L. Rendall, M.D.  DATE OF BIRTH:  [birth year]  DATE OF PROCEDURE:  08/20/2010 DATE OF DISCHARGE:                              OPERATIVE REPORT   PREOPERATIVE DIAGNOSIS:  Osteoarthritis, right knee.  SURGICAL PROCEDURE:  Right LCS total knee arthroplasty with computer navigation assistance.  POSTOPERATIVE DIAGNOSIS:  Osteoarthritis, right knee.  SURGEON:  Tiyonna Sardinha L. Rendall, M.D.  ASSISTANT:  Legrand Pitts. Duffy, P.A.  ANESTHESIA:  General with femoral nerve block.  PATHOLOGY:  The patient is 57 and has a 10-year history of right knee pain.  He has taken antiinflammatories of and on for this.  He has reached the point where it hurts him every time.  He is active and he is getting on an age and wants to remain active, but other healthy issues are making him think that he does not get it fixed, he may end up living with miserable knee in his old age.  He wants to get it fixed and has x- ray evidence of complete bone against bone medial compartment.  He has never had any success with cortisone injections and the pills no longer work.  PROCEDURE:  Under general anesthesia with a femoral nerve block, the right leg was prepared with DuraPrep, draped as a sterile field in the leg holder.  He was given prophylactic antibiotics before the tourniquet goes up.  After applying a sterile tourniquet and wrapping out the leg with the Esmarch, the tourniquet was used at 350 mm.  Midline incision was made.  The deep incision was medial parapatellar.  The patella was everted.  The femur was sized to a large, a debridement was done in preparation for computer mapping.  Then two Schanz pins were placed in the superomedial tibia and the distal femur.  The rays were set up, the femoral head and the malleoli were identified.  The tibia  and femur were mapped.  All this was done within 0.3 mm of reproducible accuracy.  At this point, the patient was noted to be in 6 degrees of varus with a 4 mm flexion contracture.  At this point, releases were done and the first computer guide was used for proximal tibial resection.  This was done within 1 degree of anatomic alignment.  The tensioner was then used and the ligaments were then balanced to within 1-1/2 degrees of anatomic alignment.  The computer was used for helping guide further releases medially and posteriorly to attain better alignment.  At this point, the first femoral guide was used and the anterior and posterior flare of the distal femur were resected to within 1 degree of anatomic rotation.  The distal femoral cut was then made again within a degree to degree and a half of anatomic alignment.  The lamina spreaders inserted.  Remnants of menisci and cruciates were debrided.  The recessing guide was then used. At this point, the tibial surface was sized to a #4 because of his weight and size.  He is a solidly built big man in  the mid 200s, the decision was made to go with the revision tibial tray as it gives better purchase in the proximal tibia.  So, a size #4 MBT revision tray was used, a 10 bearing was used, and the size for femoral component revealed excellent fit, alignment and stability, alignment is approximately measured at 2-1/2 degrees varus.  This amount of varus was puzzling as we could not account for it after previous measurements.  Clinically, the leg looks straighter than this.  The patella was osteotomized and a patella trial was inserted.  At this point, all trial components were removed.  Bony surfaces prepared with pulse irrigation.  Permanent components were cemented in place.  Once the cement was hardened, the tourniquet was let down at approximately an hour and 10 minutes.  Small vessels were cauterized.  Medium Hemovac drain was inserted.  The  knee was then closed in layers with #1 Tycron, #1 Vicryl, 2-0 Vicryl, and skin clips.  Operative time approximately an hour and 25 minutes.  Blood loss less than 200 cc.  The patient tolerated the procedure well and returned to recovery in good condition.     Galilee Pierron L. Priscille Kluver, M.D.     Renato Gails  D:  08/20/2010  T:  08/20/2010  Job:  [id number]  Electronically Signed by Erasmo Leventhal M.D. on 08/27/2010 10:26:04 AM

## 2010-08-28 LAB — BASIC METABOLIC PANEL
CO2: 27 mEq/L (ref 19–32)
Chloride: 102 mEq/L (ref 96–112)
Sodium: 135 mEq/L (ref 135–145)

## 2010-08-28 LAB — CBC
MCV: 89.9 fL (ref 78.0–100.0)
Platelets: 296 10*3/uL (ref 150–400)
RBC: 4.16 MIL/uL — ABNORMAL LOW (ref 4.22–5.81)
WBC: 8.5 10*3/uL (ref 4.0–10.5)

## 2010-09-10 NOTE — Consult Note (Signed)
NAMEXYLON, CROOM                ACCOUNT NO.:  [id number]  MEDICAL RECORD NO.:  [id number]  LOCATION:  1607                         FACILITY:  [facility]  PHYSICIAN:  Ardeth Sportsman, MD     DATE OF BIRTH:  December 31, 1928  DATE OF CONSULTATION:  08/25/2010 DATE OF DISCHARGE:                                CONSULTATION   CHIEF COMPLAINT:  Postop ileus versus small bowel obstruction.  BRIEF HISTORY:  The patient is a 77 year old gentleman who was admitted on August 20, 2010, and underwent a total knee on the right.  He developed some abdominal distention on August 22, 2010, and had some nausea and vomiting on August 23, 2010.  He has subsequently been made n.p.o.  His pain medications have been withheld and he refused any more medicines.  X-rays from today are still pending.  He had a bowel movement yesterday with a Fleet Enema and another bowel movement this morning.  X-rays obtained on August 22, 2010, shows diffuse gaseous distention of a large and a small bowel with surgical clips on the left and radiation therapy seed from his prostate cancer identified.  It was compatible with an ileus.  Repeat study on August 24, 2010, showed persistent distention of small bowel.  There is air in the colon, has almost completely resolved and upright.  The finding was consistent with partial small bowel obstruction.  X-rays had been obtained today which shows ongoing small bowel distention.  The final report is not yet available.  PAST MEDICAL HISTORY:  Polyps with resection in March 2010 and prostate cancer with seed implants in December 2005.  PAST SURGICAL HISTORY: 1. Resection of the terminal ileum and ascending colon with primary     anastomosis, March 20, 2008. 2. Resection of sigmoid colon secondary to diverticulitis September     2001. 3. Appendectomy at age 7.  FAMILY HISTORY:  Father died in the 12s in [place], World War II. Mother died on hemodialysis.  Brothers deceased on  hemodialysis.  One sister in good health.  Tobacco, he smoked about 20 years, none since age 41.  Alcohol none.  Drugs none.  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  REVIEW OF SYSTEMS:  Fever:  None.  CEREBROVASCULAR:  No headaches, dizziness, or syncope.  No history of stroke or seizure.  PULMONARY:  No orthopnea, no PND, no DOE.  Weight, no change.  CARDIAC:  No history of chest pain or MI.  GI:  No nausea, vomiting, diarrhea, constipation, or blood prior to admission.  His bowels were regular prior to admission. EYES:  Never had problems with obstruction in the past.  GU:  No trouble voiding prior to admission or now.  EXTREMITIES:  Lower extremities, mild swelling in his lower extremities.  He was treated temporarily with what sounds like Maxzide and was discontinued.  MUSCULOSKELETAL: Positive for osteoarthritis.  His mobility is limited secondary to this. ENDOCRINE:  Negative.  PSYCH:  Negative.  CURRENT MEDICATIONS: 1. Arixtra 2.5 mg daily. 2. Reglan 10 mg b.i.d. 3. Multivitamin one daily. 4. Protonix 40 mg daily. 5. Fleet Enema. Preadmission medications:  None.  ALLERGIES: 1. PENICILLIN. 2. CHICKEN also  makes him nauseated.  PHYSICAL EXAMINATION:  GENERAL:  Well-nourished, obese white male in no acute distress.  His weight is 126 kg, height is 68 inches. VITAL SIGNS:  Temperature is 98.2, heart rate is 88, blood pressure is 160/97, respiratory rate is 18, and sats are 91% on room air. HEAD:  Normocephalic.  Eyes, ears, nose and throat within normal limits. NECK:  Trachea is in midline.  No bruits.  No JVD.  No thyromegaly. CHEST:  Clear to auscultation and percussion. CARDIAC:  No murmurs or rub.  Normal S1 and S2.  Pulses are +2 in the upper and lower extremities. ABDOMEN:  He is in chair.  He is slightly distended.  He has bowel sounds.  He is not really tender now.  He did complain of some left upper quadrant tenderness prior.  No hernia or masses.   Well-healed surgical scars. GU/RECTAL:  Deferred.  Lymphadenopathy none palpated. MUSCULOSKELETAL:  Status post total right knee with dressing over his knee. SKIN:  No changes. NEUROLOGIC:  Cranial nerves grossly intact. PSYCH:  Normal affect.  LABORATORY DATA:  Sodium is 137, potassium is 3.7, chloride 98, CO2 is 28, BUN is 30, creatinine is 1.48, and glucose is 115.  Total bilirubin 0.9, alk phos 55, SGOT 18, and SGPT 17.  White count is 8.1, hemoglobin is 14, hematocrit 41, and platelets are 307,000.  Creatinine on admission was 1.55.  X-RAYS:  As noted above.  IMPRESSION: 1. Ileus versus small bowel obstruction, currently no nausea.     Positive bowel movement this morning. 2. History of sigmoid colon resection in 2001, terminal ileum and     ascending colon in March 2010. 3. Severe osteoarthritis, status post total knee replacement on August 20, 2010. 4. History of cancer of prostate with seed implants in December 2005. 5. Mild renal insufficiency.  PLAN:  He has not had an NG.  He is to keep himself n.p.o. except for few ice chips.  He is on bowel rest and hydration.  We would agree with that.  If he has any nausea or vomiting, we would recommend inserting an NG.  We will continue to follow.    Eber Hong, P.A.   ______________________________ Ardeth Sportsman, MD   WDJ/MEDQ  D:  08/25/2010  T:  08/25/2010  Job:  [id number]  Electronically Signed by Sherrie George P.A. on 09/03/2010 09:14:51 PM Electronically Signed by Karie Soda MD on 09/10/2010 08:17:43 AM

## 2010-09-20 NOTE — Discharge Summary (Signed)
Christopher [name], Christopher [name]                ACCOUNT NO.:  [id number]  MEDICAL RECORD NO.:  [id number]  LOCATION:  1607                         FACILITY:  [facility]  PHYSICIAN:  Zykira Matlack L. Rendall, M.D.  DATE OF BIRTH:  07-07-1928  DATE OF ADMISSION:  08/20/2010 DATE OF DISCHARGE:  08/28/2010                              DISCHARGE SUMMARY   ADMISSION DIAGNOSES: 1. End-stage osteoarthritis, right knee. 2. BMI of 40.6/obesity. 3. Hypertension. 4. History of prostate cancer. 5. Diverticulosis. 6. History of colon polyps.  DISCHARGE DIAGNOSES: 1. End-stage osteoarthritis, right knee, status post right total knee     arthroplasty. 2. Postoperative ileus and partial small bowel obstruction, now     resolved. 3. Acute blood loss anemia secondary to surgery. 4. Obesity with BMI of 40.6. 5. Hypertension. 6. History of prostate cancer. 7. Diverticulosis. 8. History of colon polyps.  SURGICAL PROCEDURES:  On August 20, 2010, Christopher [name] underwent a right total knee arthroplasty with computer navigation by Dr. Jonny Ruiz L. Rendall, assisted by Arnoldo Morale, PA-C.  He had a DePuy primary femoral component cemented, size large, right, placed with a LCS complete metal back patella cemented size large.  An MBT revision tibial tray size 4 cemented with a LCS rotating platform 10 mm thickness polyethylene.  COMPLICATIONS:  None.  CONSULTS: 1. Physical therapy, case management, social work consult on August 21, 2010. 2. Occupational therapy consult on August 10th. 3. General surgery consult on August 25, 2010.  HISTORY OF PRESENT ILLNESS:  This 77 year old white male patient presented to Dr. Priscille [name] with 10 year history of gradual onset of progressive right knee pain without injury or surgery.  At this point, the right knee pain is intermittent, ache, diffuse about the knee without radiation.  Nothing makes it worse or better, he has not really taken any meds, but the knee gives way, he has not had  cortisone or viscosupplementation.  His knee bothersome quite a bit and x-rays show end-stage arthritic changes.  Because of this he is presenting for right knee replacement.  HOSPITAL COURSE:  Christopher [name] tolerated his surgical procedure well without immediate postoperative complications.  He was transferred to the orthopedic floor.  On postop day #1, he was afebrile, vitals were stable, hemoglobin was 13.2, hematocrit 38.5.  He was tolerating the CPM, weaned off his oxygen, and started on therapy per protocol.  Plans were made for probable [facility] discharge on Monday.  On postop day #2, he complained of some complaints of nausea, T-max was 99.2, vitals were still stable, his knee was doing well with therapy, and he was continued to be monitored.  On the 12th, he complained of constant nausea, he would not tolerate POs well.  He was having bowel movements and passing gas, but continued to have nausea.  Hemoglobin/hematocrit were stable at 14 and 41, white count was elevated at 12.4, he remained afebrile, knee looked good, and he was continued on therapy.  On August 13th, he had had multiple bowel movements, but still with constant complaints of nausea.  Low bowel sounds in his abdomen and a KUB that day showed a possible small bowel obstruction.  He was  placed on sips of clear liquids, Protonix, and Reglan and general surgery consult was obtained.  They recommended bowel rest and with any repetitive nausea placement of NG tube.  That did not have to be done, his vitals did remain stable, his white count went down to 8.1 on the 14th, he continued to make progress from an orthopedic standpoint, his maintenance fluids were restarted due to no n.p.o. intake and that was continued for the next several days.  He continued to make slow progress, gradually his bowels did continue to move, he was able to start taking POs on August 16th, he was started back on Xarelto at that time  because he had been switch while he was n.p.o. to Arixtra.  On August 17th, he was doing well with therapy, he was walking long distances, he was tolerating his diet, he was having BMs, and because of that he was discharged home.  DISCHARGE INSTRUCTIONS:  DIET:  He can resume his regular prehospitalization diet.  MEDICATIONS:  Please see the patient's home med rec sheet for complete documentation of his medications.  New meds, we placed him Robaxin, Percocet, Protonix, and Xarelto.  He was to hold off on his [place] bitters at this time.  ACTIVITY:  He can be out of bed, weightbearing as tolerated on the right leg with use of walker.  No lifting or driving for 6 weeks.  Home CPM 0- 90 degrees and home health PT per [facility].  Please see the white total joint discharge sheet for further activity instructions.  WOUND CARE:  Please see the white total joint discharge sheet for further wound care instructions.  FOLLOWUP:  He is to follow up with Dr. Priscille [name] in our office on Tuesday, August 21st, and he is to call [phone] for that appointment.  LABORATORY DATA:  Hemoglobin/hematocrit ranged from 13.2 and 38.5 on the 10th to 14.2 and 41.3 on the 14th to 12.4 and 37.4 on the 17th.  White count went from 9.5 on the 10th to 12.4 on the 12th to 8.5 on the 17th. Platelets were all within normal limits.  Potassium dropped to a low of 3.1 on the 15th, it went to 4.6 on the 17th.  BUN and creatinine were 30 and 1.48 on the 14th then 19 and 1.24 on the 17th.  Estimated GFR on the 10th was 51 went to a low of 46 on the 14th and back to 51 on the 15th and 56 on the 17th.  All other laboratory studies were within normal limits.     Christopher [name], P.A.   ______________________________ Christopher [name], M.D.    KED/MEDQ  D:  09/10/2010  T:  09/11/2010  Job:  [id number]  Electronically Signed by Otilio Jefferson. on 09/17/2010 09:58:44 AM Electronically Signed by Erasmo Leventhal M.D. on 09/20/2010 10:11:59 AM

## 2010-09-22 ENCOUNTER — Other Ambulatory Visit: Payer: Self-pay | Admitting: Dermatology

## 2010-10-08 ENCOUNTER — Other Ambulatory Visit: Payer: Self-pay | Admitting: Geriatric Medicine

## 2010-10-08 ENCOUNTER — Ambulatory Visit
Admission: RE | Admit: 2010-10-08 | Discharge: 2010-10-08 | Disposition: A | Discharge status: Home / Self Care | Payer: Medicare Other | Source: Ambulatory Visit | Attending: Geriatric Medicine | Admitting: Geriatric Medicine

## 2010-10-08 DIAGNOSIS — M7989 Other specified soft tissue disorders: Secondary | ICD-10-CM

## 2010-11-22 DIAGNOSIS — C61 Malignant neoplasm of prostate: Secondary | ICD-10-CM | POA: Insufficient documentation

## 2011-01-04 DIAGNOSIS — R972 Elevated prostate specific antigen [PSA]: Secondary | ICD-10-CM | POA: Insufficient documentation

## 2011-02-10 DIAGNOSIS — C61 Malignant neoplasm of prostate: Secondary | ICD-10-CM | POA: Diagnosis not present

## 2011-04-02 DIAGNOSIS — H2510 Age-related nuclear cataract, unspecified eye: Secondary | ICD-10-CM | POA: Diagnosis not present

## 2011-05-25 DIAGNOSIS — Z85828 Personal history of other malignant neoplasm of skin: Secondary | ICD-10-CM | POA: Diagnosis not present

## 2011-05-25 DIAGNOSIS — L821 Other seborrheic keratosis: Secondary | ICD-10-CM | POA: Diagnosis not present

## 2011-05-25 DIAGNOSIS — L570 Actinic keratosis: Secondary | ICD-10-CM | POA: Diagnosis not present

## 2011-06-02 DIAGNOSIS — I872 Venous insufficiency (chronic) (peripheral): Secondary | ICD-10-CM | POA: Diagnosis not present

## 2011-06-02 DIAGNOSIS — Z79899 Other long term (current) drug therapy: Secondary | ICD-10-CM | POA: Diagnosis not present

## 2011-06-02 DIAGNOSIS — R609 Edema, unspecified: Secondary | ICD-10-CM | POA: Diagnosis not present

## 2011-07-28 ENCOUNTER — Encounter: Payer: Self-pay | Admitting: Vascular Surgery

## 2011-07-29 ENCOUNTER — Ambulatory Visit (INDEPENDENT_AMBULATORY_CARE_PROVIDER_SITE_OTHER): Payer: Medicare Other | Admitting: *Deleted

## 2011-07-29 ENCOUNTER — Ambulatory Visit (INDEPENDENT_AMBULATORY_CARE_PROVIDER_SITE_OTHER): Payer: Medicare Other | Admitting: Vascular Surgery

## 2011-07-29 ENCOUNTER — Encounter: Payer: Self-pay | Admitting: Vascular Surgery

## 2011-07-29 VITALS — BP 149/92 | HR 82 | Resp 20 | Ht 68.0 in | Wt 270.0 lb

## 2011-07-29 DIAGNOSIS — I83893 Varicose veins of bilateral lower extremities with other complications: Secondary | ICD-10-CM

## 2011-07-29 DIAGNOSIS — R609 Edema, unspecified: Secondary | ICD-10-CM

## 2011-07-29 NOTE — Progress Notes (Signed)
The patient presents today for evaluation of lower extremity swelling. He does have a history of progressive bilateral lower extremity swelling. He noted his first began occurring in his left leg after prostate radioactive seed implants and external beam radiation in 2005. He had a right total knee replacement 2012 and has noted some swelling in both legs since that time. He does not have any severe pain associated with this but does have discomfort with chronic swelling particularly at the end of the day. He has worn a light grade support hose with some relief. He does require diuretic therapy for control of the swelling as well. He does have some scattered mild diffuse varicosities but no large varicosities. He does not have any history of DVT in does not have any history of bleeding from his varicosities.  Past Medical History  Diagnosis Date  . Varicose veins   . Chronic kidney disease   . Leg edema   . Hyperlipidemia   . Cancer 2005    prostate cancer treated with external beam radiation and radioactive seeds  . Chronic low back pain   . Diminished hearing     right ,  injury WW 2  . Diverticulosis   . DJD (degenerative joint disease)     thumbs and knees  . Obesity   . Presbycusis     History  Substance Use Topics  . Smoking status: Former Smoker -- 1.0 packs/day for 12 years    Types: Cigarettes    Quit date: 07/28/1976  . Smokeless tobacco: Never Used  . Alcohol Use: No    Family History  Problem Relation Age of Onset  . Kidney disease Mother     Allergies  Allergen Reactions  . Chicken Allergy   . Penicillins     Current outpatient prescriptions:docusate sodium (COLACE) 50 MG capsule, Take by mouth., Disp: , Rfl: ;  Calcium Magnesium 600-100-300 LIQD, Take by mouth daily., Disp: , Rfl: ;  clotrimazole-betamethasone (LOTRISONE) cream, Apply topically 2 (two) times daily., Disp: , Rfl: ;  naproxen sodium (ANAPROX) 220 MG tablet, Take 220 mg by mouth daily., Disp: , Rfl:   triamterene-hydrochlorothiazide (MAXZIDE) 75-50 MG per tablet, Take 1 tablet by mouth daily., Disp: , Rfl: ;  vitamin C (ASCORBIC ACID) 500 MG tablet, Take 500 mg by mouth daily., Disp: , Rfl:   BP 149/92  Pulse 82  Resp 20  Ht 5\' 8"  (1.727 m)  Wt 270 lb (122.471 kg)  BMI 41.05 kg/m2  Body mass index is 41.05 kg/(m^2).       Review of systems is positive for pain in his leg with walking status post knee replacement and also with the swelling and varicosities. Review systems otherwise negative except for past medical history  Physical exam: Well-developed well-nourished white male appearing stated age in no acute distress Pulse status 2+ radial and dorsalis pedis pulses bilaterally Respirations are nonlabored. Neurologically he is grossly intact Skin as noted to have hemosiderin deposits circumferentially at the level of his left ankle. Minimal and his right Extremities with no major deformities does have a prior scar from her right total knee replacement  Noninvasive vascular laboratories in our office today were reviewed with the patient. This does show reflux in her saphenous veins bilaterally. He has mild reflux in the deep veins bilaterally.  Impression and plan: Progressive swelling bilateral lower extremities. Had a long discussion with the patient regarding the potential etiologies of this. He certainly does have superficial venous reflux that could be  a major contributor to this. The swelling also could be related to the prior right total knee and potentially from his external beam radiation therapy for prostate cancer. He has not worn thigh high graduated compression garments and we fitted him for these today. We will see him again in 3 months to determine if he is able to tolerate this therapy. I did discuss the possibility of laser ablation of his great saphenous vein for treatment of his venous hypertension. I explained this is an outpatient procedure under local anesthetic.  We will discuss this further in 3 months. I did explain that his current swelling is not dangerous but certainly it is limiting his mobility and he is concerned regarding the chronic nature of this area he does have changes of venous hypertension with hemosiderin deposit in the left leg but does not have any evidence of the retina ulceration currently

## 2011-08-06 NOTE — Procedures (Unsigned)
LOWER EXTREMITY VENOUS REFLUX EXAM  INDICATION:  Varicose veins.  EXAM:  Using color-flow imaging and pulse Doppler spectral analysis, the bilateral common femoral, femoral, popliteal, posterior tibial, greater and lesser saphenous veins are evaluated.  There is evidence suggesting deep venous insufficiency in the bilateral lower extremity.  The bilateral saphenofemoral junction is not competent with Reflux of >561milliseconds. The bilateral GSV is not competent with Reflux of >579milliseconds, right greater than left.   The left proximal short saphenous vein demonstrates mild incompetency.  GSV Diameter (used if found to be incompetent only)                                                       Right      Left Proximal Greater Saphenous Vein                       0.73 cm    0.34 cm Proximal-to-mid-thigh                                 0.63 cm    0.31 cm Mid thigh                                             0.55 cm    0.37 cm Mid-distal thigh                                      0.63 cm    cm Distal thigh                                          0.57 cm    cm Knee                                                  0.46 cm    cm  IMPRESSION: 1. Bilaterally greater saphenous vein is not competent with reflux     >500 milliseconds. 2. The bilateral greater saphenous vein is not tortuous. 3. The deep venous system is mildly incompetent with Reflux of  >500     milliseconds bilaterally. 4. The left small saphenous vein is not competent with Reflux of  >500     milliseconds.) 5. Varicosities visualized on the left as well as perforators. 6. No evidence of DVT bilaterally.    ___________________________________________ Larina Earthly, M.D.  SS/MEDQ  D:  07/29/2011  T:  07/29/2011  Job:  [id number]

## 2011-08-25 DIAGNOSIS — C61 Malignant neoplasm of prostate: Secondary | ICD-10-CM | POA: Diagnosis not present

## 2011-09-07 ENCOUNTER — Encounter: Payer: Medicare Other | Admitting: Vascular Surgery

## 2011-11-01 ENCOUNTER — Encounter: Payer: Self-pay | Admitting: Vascular Surgery

## 2011-11-02 ENCOUNTER — Encounter: Payer: Self-pay | Admitting: Vascular Surgery

## 2011-11-02 ENCOUNTER — Ambulatory Visit (INDEPENDENT_AMBULATORY_CARE_PROVIDER_SITE_OTHER): Payer: Medicare Other | Admitting: Vascular Surgery

## 2011-11-02 VITALS — BP 108/76 | HR 80 | Ht 68.0 in | Wt 282.0 lb

## 2011-11-02 DIAGNOSIS — I83893 Varicose veins of bilateral lower extremities with other complications: Secondary | ICD-10-CM | POA: Diagnosis not present

## 2011-11-02 NOTE — Progress Notes (Signed)
Problems with Activities of Daily Living Secondary to Leg Pain  1. Christopher [name] has had to stop exercising due to leg pain.   2. Christopher [name] has great difficulty sleeping due to leg pain.  3. Christopher [name] states any activities that require prolonged standing (house hold chores, cooking, yard work) due to leg pain.   Failure of  Conservative Therapy:  1. Worn 20-30 mm Hg thigh high compression hose >3 months with no relief of symptoms.  2. Frequently elevates legs-no relief of symptoms  3. Taken Ibuprofen 600 Mg TID with no relief of symptoms.  The patient presents today for continued discussion regarding bilateral venous hypertension. He has not been able to tolerate the thigh-high compression with great difficulty in applying these and also pain with with wearing them as well. He continues to have swelling and changes of chronic venous hypertension with marked hemosiderin deposit bilaterally. He does have a venous hypertension related to saphenous incompetence. I recommended staged bilateral laser ablation for relief of this. He wishes to proceed with his left leg first Mrs. his most symptomatic and then assuming a good outcome would then do a staged right leg laser ablation at his convenience

## 2011-11-03 ENCOUNTER — Other Ambulatory Visit: Payer: Self-pay | Admitting: *Deleted

## 2011-11-03 DIAGNOSIS — I83893 Varicose veins of bilateral lower extremities with other complications: Secondary | ICD-10-CM

## 2011-11-22 DIAGNOSIS — I872 Venous insufficiency (chronic) (peripheral): Secondary | ICD-10-CM | POA: Diagnosis not present

## 2011-11-22 DIAGNOSIS — Z23 Encounter for immunization: Secondary | ICD-10-CM | POA: Diagnosis not present

## 2011-11-22 DIAGNOSIS — Z79899 Other long term (current) drug therapy: Secondary | ICD-10-CM | POA: Diagnosis not present

## 2011-11-22 DIAGNOSIS — Z1331 Encounter for screening for depression: Secondary | ICD-10-CM | POA: Diagnosis not present

## 2011-11-22 DIAGNOSIS — Z0000 Encounter for general adult medical examination without abnormal findings: Secondary | ICD-10-CM | POA: Diagnosis not present

## 2011-11-22 DIAGNOSIS — E7800 Pure hypercholesterolemia, unspecified: Secondary | ICD-10-CM | POA: Diagnosis not present

## 2011-11-29 DIAGNOSIS — L821 Other seborrheic keratosis: Secondary | ICD-10-CM | POA: Diagnosis not present

## 2011-11-29 DIAGNOSIS — L570 Actinic keratosis: Secondary | ICD-10-CM | POA: Diagnosis not present

## 2011-11-29 DIAGNOSIS — D2330 Other benign neoplasm of skin of unspecified part of face: Secondary | ICD-10-CM | POA: Diagnosis not present

## 2011-11-29 DIAGNOSIS — D485 Neoplasm of uncertain behavior of skin: Secondary | ICD-10-CM | POA: Diagnosis not present

## 2011-11-29 DIAGNOSIS — Z85828 Personal history of other malignant neoplasm of skin: Secondary | ICD-10-CM | POA: Diagnosis not present

## 2011-11-29 DIAGNOSIS — D1801 Hemangioma of skin and subcutaneous tissue: Secondary | ICD-10-CM | POA: Diagnosis not present

## 2011-11-30 ENCOUNTER — Encounter: Payer: Self-pay | Admitting: Vascular Surgery

## 2011-12-01 ENCOUNTER — Ambulatory Visit (INDEPENDENT_AMBULATORY_CARE_PROVIDER_SITE_OTHER): Payer: Medicare Other | Admitting: Vascular Surgery

## 2011-12-01 ENCOUNTER — Encounter: Payer: Self-pay | Admitting: Vascular Surgery

## 2011-12-01 VITALS — BP 145/79 | HR 79 | Resp 20 | Ht 68.0 in | Wt 274.0 lb

## 2011-12-01 DIAGNOSIS — I83893 Varicose veins of bilateral lower extremities with other complications: Secondary | ICD-10-CM | POA: Diagnosis not present

## 2011-12-01 HISTORY — PX: ENDOVENOUS ABLATION SAPHENOUS VEIN W/ LASER: SUR449

## 2011-12-01 NOTE — Progress Notes (Signed)
Laser Ablation Procedure      Date: 12/01/2011    Christopher [name] DOB:01-Dec-1928  Consent signed: Yes  Surgeon:T.F. Layla Kesling  Procedure: Laser Ablation: left Greater Saphenous Vein  BP 145/79  Pulse 79  Resp 20  Ht 5\' 8"  (1.727 m)  Wt 274 lb (124.286 kg)  BMI 41.66 kg/m2  Start time: 11:15AM   End time: 12:05PM  Tumescent Anesthesia: 400 cc 0.9% NaCl with 50 cc Lidocaine HCL with 1% Epi and 15 cc 8.4% NaHCO3  Local Anesthesia: 3 cc Lidocaine HCL and NaHCO3 (ratio 2:1)  Continuous Mode: 15 Watts Total Energy 2377 Joules Total Time2:38       Patient tolerated procedure well: Yes    Description of Procedure:  After marking the course of the saphenous vein and the secondary varicosities in the standing position, the patient was placed on the operating table in the supine position, and the left leg was prepped and draped in sterile fashion. Local anesthetic was administered, and under ultrasound guidance the saphenous vein was accessed with a micro needle and guide wire; then the micro puncture sheath was placed. A guide wire was inserted to the saphenofemoral junction, followed by a 5 french sheath.  The position of the sheath and then the laser fiber below the junction was confirmed using the ultrasound and visualization of the aiming beam.  Tumescent anesthesia was administered along the course of the saphenous vein using ultrasound guidance. Protective laser glasses were placed on the patient, and the laser was fired at at 15 watt continuous mode.  For a total of 2377 joules.  A steri strip was applied to the puncture site.       ABD pads and thigh high compression stockings were applied.  Ace wrap bandages were applied at the top of the saphenofemoral junction.  Blood loss was less than 15 cc.  The patient ambulated out of the operating room having tolerated the procedure well.  Patient had an uneventful laser ablation from the area of his left knee to the saphenofemoral  junction. He will be seen again in one week

## 2011-12-02 ENCOUNTER — Encounter: Payer: Self-pay | Admitting: Vascular Surgery

## 2011-12-02 ENCOUNTER — Telehealth: Payer: Self-pay | Admitting: *Deleted

## 2011-12-02 NOTE — Telephone Encounter (Signed)
12/02/2011  Time: 12:57 PM   Patient Name: Christopher [name]  Patient of: T.F. Early  Procedure:Laser Ablation left  12-01-2011  Reached patient at home and checked  His status  Yes    Comments/Actions Taken: Mr. Mcelwain states he is not experiencing pain or swelling.  Reviewed post procedural instructions with Mr. Kaufhold and reminded him of post laser ablation duplex and follow up appointment with Dr. Arbie Cookey on 12-07-2011.       @SIGNATURE @

## 2011-12-06 ENCOUNTER — Encounter: Payer: Self-pay | Admitting: Vascular Surgery

## 2011-12-07 ENCOUNTER — Ambulatory Visit (INDEPENDENT_AMBULATORY_CARE_PROVIDER_SITE_OTHER): Payer: Medicare Other | Admitting: Vascular Surgery

## 2011-12-07 ENCOUNTER — Encounter (INDEPENDENT_AMBULATORY_CARE_PROVIDER_SITE_OTHER): Payer: Medicare Other | Admitting: *Deleted

## 2011-12-07 ENCOUNTER — Encounter: Payer: Self-pay | Admitting: Vascular Surgery

## 2011-12-07 VITALS — BP 109/79 | HR 97 | Resp 20 | Ht 68.0 in | Wt 268.0 lb

## 2011-12-07 DIAGNOSIS — I83893 Varicose veins of bilateral lower extremities with other complications: Secondary | ICD-10-CM

## 2011-12-07 NOTE — Progress Notes (Signed)
The patient presents today one week status post laser ablation of left great saphenous vein. He was recovered well throughout the procedure report she's had minimal discomfort following this. He does have some difficulty in applying compression and therefore is been wrapping his leg with an Ace wrap due to his inability to replace the stocking.  On physical exam he does have some mild bruising and does have thickening of the saphenous vein ablation site.  Venous duplex of his left leg reveals closure of the branch of the saphenous vein that was treated in the distal thigh and complete occlusion of the saphenous vein to just below the saphenofemoral junction with no evidence of DVT  Impression and plan: Successful ablation of left great saphenous vein. The patient will continue his compression for one additional week. He is scheduled for similar treatment to his right leg on December 11

## 2011-12-21 ENCOUNTER — Encounter: Payer: Self-pay | Admitting: Vascular Surgery

## 2011-12-22 ENCOUNTER — Ambulatory Visit (INDEPENDENT_AMBULATORY_CARE_PROVIDER_SITE_OTHER): Payer: Medicare Other | Admitting: Vascular Surgery

## 2011-12-22 ENCOUNTER — Encounter: Payer: Self-pay | Admitting: Vascular Surgery

## 2011-12-22 VITALS — BP 133/80 | HR 91 | Resp 18 | Ht 68.0 in | Wt 268.0 lb

## 2011-12-22 DIAGNOSIS — I83893 Varicose veins of bilateral lower extremities with other complications: Secondary | ICD-10-CM | POA: Diagnosis not present

## 2011-12-22 HISTORY — PX: ENDOVENOUS ABLATION SAPHENOUS VEIN W/ LASER: SUR449

## 2011-12-22 NOTE — Progress Notes (Signed)
Laser Ablation Procedure      Date: 12/22/2011    Christopher [name] DOB:23-Oct-1928  Consent signed: Yes  Surgeon:T.F. Fredia Chittenden  Procedure: Laser Ablation: right Greater Saphenous Vein  BP 133/80  Pulse 91  Resp 18  Ht 5\' 8"  (1.727 m)  Wt 268 lb (121.564 kg)  BMI 40.75 kg/m2  Start time: 8:45am   End time: 9:40am  Tumescent Anesthesia: 425 cc 0.9% NaCl with 50 cc Lidocaine HCL with 1% Epi and 15 cc 8.4% NaHCO3  Local Anesthesia: 4.5 cc Lidocaine HCL and NaHCO3 (ratio 2:1)  Continuous Mode: 15 Watts Total Energy 2746 Joules Total Time3:03       Patient tolerated procedure well: Yes    Description of Procedure:  After marking the course of the saphenous vein and the secondary varicosities in the standing position, the patient was placed on the operating table in the supine position, and the right leg was prepped and draped in sterile fashion. Local anesthetic was administered, and under ultrasound guidance the saphenous vein was accessed with a micro needle and guide wire; then the micro puncture sheath was placed. A guide wire was inserted to the saphenofemoral junction, followed by a 5 french sheath.  The position of the sheath and then the laser fiber below the junction was confirmed using the ultrasound and visualization of the aiming beam.  Tumescent anesthesia was administered along the course of the saphenous vein using ultrasound guidance. Protective laser glasses were placed on the patient, and the laser was fired at at 15 watt continuous mode.  For a total of 2746 joules.  A steri strip was applied to the puncture site.      ABD pads and thigh high compression stockings were applied.  Ace wrap bandages were applied  at the top of the saphenofemoral junction.  Blood loss was less than 15 cc.  The patient ambulated out of the operating room having tolerated the procedure well.

## 2011-12-23 ENCOUNTER — Telehealth: Payer: Self-pay | Admitting: *Deleted

## 2011-12-23 NOTE — Telephone Encounter (Signed)
12/23/2011  Time: 12:43 PM   Patient Name: Christopher [name]  Patient of: T.F. Early  Procedure:Laser Ablation right greater saphenous vein 12-22-2011  Reached patient at home and checked  His status  Yes    Comments/Actions Taken: Mr. Arlester states that he has no problems with bleeding, swelling, or pain.  Reviewed post procedural instructions with him and reminded him of post laser ablation duplex and follow up with Dr. Arbie Cookey on 12-28-2011.    @SIGNATURE @

## 2011-12-27 ENCOUNTER — Encounter: Payer: Self-pay | Admitting: Vascular Surgery

## 2011-12-28 ENCOUNTER — Ambulatory Visit (INDEPENDENT_AMBULATORY_CARE_PROVIDER_SITE_OTHER): Payer: Medicare Other | Admitting: Vascular Surgery

## 2011-12-28 ENCOUNTER — Encounter: Payer: Self-pay | Admitting: Vascular Surgery

## 2011-12-28 ENCOUNTER — Encounter (INDEPENDENT_AMBULATORY_CARE_PROVIDER_SITE_OTHER): Payer: Medicare Other | Admitting: *Deleted

## 2011-12-28 VITALS — BP 130/80 | HR 80 | Resp 16 | Ht 68.0 in | Wt 285.0 lb

## 2011-12-28 DIAGNOSIS — I83893 Varicose veins of bilateral lower extremities with other complications: Secondary | ICD-10-CM

## 2011-12-28 NOTE — Progress Notes (Signed)
The patient presents today for followup of his staged bilateral great saphenous vein ablation. Left side was on 12/02/2011 and right side on 12/22/2011. He has the usual amount of soreness in his thighs from the ablation site but has had no other difficulties. He does have difficulty in placing his compression and has been coming to our office for fitting of this.  Physical exam reveals a typical thickening and erythema out erythema and bruising at the area of the medial thigh with no evidence of ulceration.  Venous duplex today shows closure of the right great saphenous vein with no evidence of DVT  Impression and plan: Successful staged bilateral great saphenous vein laser ablation. He will continue the compression for an additional week and then on an as-needed basis

## 2012-02-16 DIAGNOSIS — R972 Elevated prostate specific antigen [PSA]: Secondary | ICD-10-CM | POA: Diagnosis not present

## 2012-02-16 DIAGNOSIS — C61 Malignant neoplasm of prostate: Secondary | ICD-10-CM | POA: Diagnosis not present

## 2012-04-07 DIAGNOSIS — H2510 Age-related nuclear cataract, unspecified eye: Secondary | ICD-10-CM | POA: Diagnosis not present

## 2012-05-30 DIAGNOSIS — D1801 Hemangioma of skin and subcutaneous tissue: Secondary | ICD-10-CM | POA: Diagnosis not present

## 2012-05-30 DIAGNOSIS — L821 Other seborrheic keratosis: Secondary | ICD-10-CM | POA: Diagnosis not present

## 2012-05-30 DIAGNOSIS — Z85828 Personal history of other malignant neoplasm of skin: Secondary | ICD-10-CM | POA: Diagnosis not present

## 2012-05-30 DIAGNOSIS — D692 Other nonthrombocytopenic purpura: Secondary | ICD-10-CM | POA: Diagnosis not present

## 2012-05-30 DIAGNOSIS — L570 Actinic keratosis: Secondary | ICD-10-CM | POA: Diagnosis not present

## 2012-06-19 DIAGNOSIS — R0989 Other specified symptoms and signs involving the circulatory and respiratory systems: Secondary | ICD-10-CM | POA: Diagnosis not present

## 2012-06-19 DIAGNOSIS — R0789 Other chest pain: Secondary | ICD-10-CM | POA: Diagnosis not present

## 2012-06-19 DIAGNOSIS — R0609 Other forms of dyspnea: Secondary | ICD-10-CM | POA: Diagnosis not present

## 2012-06-19 DIAGNOSIS — I498 Other specified cardiac arrhythmias: Secondary | ICD-10-CM | POA: Diagnosis not present

## 2012-06-23 DIAGNOSIS — N390 Urinary tract infection, site not specified: Secondary | ICD-10-CM | POA: Diagnosis not present

## 2012-08-30 DIAGNOSIS — C61 Malignant neoplasm of prostate: Secondary | ICD-10-CM | POA: Diagnosis not present

## 2012-10-06 DIAGNOSIS — H2510 Age-related nuclear cataract, unspecified eye: Secondary | ICD-10-CM | POA: Diagnosis not present

## 2012-11-22 DIAGNOSIS — Z0000 Encounter for general adult medical examination without abnormal findings: Secondary | ICD-10-CM | POA: Diagnosis not present

## 2012-11-22 DIAGNOSIS — Z23 Encounter for immunization: Secondary | ICD-10-CM | POA: Diagnosis not present

## 2012-11-22 DIAGNOSIS — I872 Venous insufficiency (chronic) (peripheral): Secondary | ICD-10-CM | POA: Diagnosis not present

## 2012-11-22 DIAGNOSIS — Z79899 Other long term (current) drug therapy: Secondary | ICD-10-CM | POA: Diagnosis not present

## 2012-11-22 DIAGNOSIS — Z1331 Encounter for screening for depression: Secondary | ICD-10-CM | POA: Diagnosis not present

## 2012-11-22 DIAGNOSIS — M25569 Pain in unspecified knee: Secondary | ICD-10-CM | POA: Diagnosis not present

## 2012-11-30 DIAGNOSIS — L570 Actinic keratosis: Secondary | ICD-10-CM | POA: Diagnosis not present

## 2012-11-30 DIAGNOSIS — L821 Other seborrheic keratosis: Secondary | ICD-10-CM | POA: Diagnosis not present

## 2012-11-30 DIAGNOSIS — Z85828 Personal history of other malignant neoplasm of skin: Secondary | ICD-10-CM | POA: Diagnosis not present

## 2013-05-30 DIAGNOSIS — N183 Chronic kidney disease, stage 3 unspecified: Secondary | ICD-10-CM | POA: Diagnosis not present

## 2013-05-30 DIAGNOSIS — M25569 Pain in unspecified knee: Secondary | ICD-10-CM | POA: Diagnosis not present

## 2013-05-30 DIAGNOSIS — R233 Spontaneous ecchymoses: Secondary | ICD-10-CM | POA: Diagnosis not present

## 2013-05-30 DIAGNOSIS — I872 Venous insufficiency (chronic) (peripheral): Secondary | ICD-10-CM | POA: Diagnosis not present

## 2013-07-19 DIAGNOSIS — D239 Other benign neoplasm of skin, unspecified: Secondary | ICD-10-CM | POA: Diagnosis not present

## 2013-07-19 DIAGNOSIS — Z85828 Personal history of other malignant neoplasm of skin: Secondary | ICD-10-CM | POA: Diagnosis not present

## 2013-07-19 DIAGNOSIS — L570 Actinic keratosis: Secondary | ICD-10-CM | POA: Diagnosis not present

## 2013-08-29 DIAGNOSIS — Z8042 Family history of malignant neoplasm of prostate: Secondary | ICD-10-CM | POA: Diagnosis not present

## 2013-08-29 DIAGNOSIS — C61 Malignant neoplasm of prostate: Secondary | ICD-10-CM | POA: Diagnosis not present

## 2013-08-29 DIAGNOSIS — R972 Elevated prostate specific antigen [PSA]: Secondary | ICD-10-CM | POA: Diagnosis not present

## 2013-12-04 DIAGNOSIS — H2513 Age-related nuclear cataract, bilateral: Secondary | ICD-10-CM | POA: Diagnosis not present

## 2013-12-12 DIAGNOSIS — C61 Malignant neoplasm of prostate: Secondary | ICD-10-CM | POA: Diagnosis not present

## 2013-12-12 DIAGNOSIS — Z1389 Encounter for screening for other disorder: Secondary | ICD-10-CM | POA: Diagnosis not present

## 2013-12-12 DIAGNOSIS — E780 Pure hypercholesterolemia: Secondary | ICD-10-CM | POA: Diagnosis not present

## 2013-12-12 DIAGNOSIS — I872 Venous insufficiency (chronic) (peripheral): Secondary | ICD-10-CM | POA: Diagnosis not present

## 2013-12-12 DIAGNOSIS — M25562 Pain in left knee: Secondary | ICD-10-CM | POA: Diagnosis not present

## 2013-12-12 DIAGNOSIS — N183 Chronic kidney disease, stage 3 (moderate): Secondary | ICD-10-CM | POA: Diagnosis not present

## 2013-12-12 DIAGNOSIS — Z0000 Encounter for general adult medical examination without abnormal findings: Secondary | ICD-10-CM | POA: Diagnosis not present

## 2013-12-12 DIAGNOSIS — Z79899 Other long term (current) drug therapy: Secondary | ICD-10-CM | POA: Diagnosis not present

## 2013-12-12 DIAGNOSIS — Z23 Encounter for immunization: Secondary | ICD-10-CM | POA: Diagnosis not present

## 2014-01-07 DIAGNOSIS — L738 Other specified follicular disorders: Secondary | ICD-10-CM | POA: Diagnosis not present

## 2014-01-07 DIAGNOSIS — L821 Other seborrheic keratosis: Secondary | ICD-10-CM | POA: Diagnosis not present

## 2014-01-07 DIAGNOSIS — D692 Other nonthrombocytopenic purpura: Secondary | ICD-10-CM | POA: Diagnosis not present

## 2014-01-07 DIAGNOSIS — Z85828 Personal history of other malignant neoplasm of skin: Secondary | ICD-10-CM | POA: Diagnosis not present

## 2014-01-07 DIAGNOSIS — L570 Actinic keratosis: Secondary | ICD-10-CM | POA: Diagnosis not present

## 2014-01-07 DIAGNOSIS — D1801 Hemangioma of skin and subcutaneous tissue: Secondary | ICD-10-CM | POA: Diagnosis not present

## 2014-01-16 DIAGNOSIS — B029 Zoster without complications: Secondary | ICD-10-CM | POA: Diagnosis not present

## 2014-03-05 DIAGNOSIS — B0221 Postherpetic geniculate ganglionitis: Secondary | ICD-10-CM | POA: Diagnosis not present

## 2014-03-05 DIAGNOSIS — M1611 Unilateral primary osteoarthritis, right hip: Secondary | ICD-10-CM | POA: Diagnosis not present

## 2014-04-03 DIAGNOSIS — B0229 Other postherpetic nervous system involvement: Secondary | ICD-10-CM | POA: Diagnosis not present

## 2014-05-27 DIAGNOSIS — B0229 Other postherpetic nervous system involvement: Secondary | ICD-10-CM | POA: Diagnosis not present

## 2014-06-04 DIAGNOSIS — H532 Diplopia: Secondary | ICD-10-CM | POA: Diagnosis not present

## 2014-06-25 ENCOUNTER — Other Ambulatory Visit: Payer: Self-pay | Admitting: Geriatric Medicine

## 2014-06-25 DIAGNOSIS — Z6841 Body Mass Index (BMI) 40.0 and over, adult: Secondary | ICD-10-CM | POA: Diagnosis not present

## 2014-06-25 DIAGNOSIS — R21 Rash and other nonspecific skin eruption: Secondary | ICD-10-CM | POA: Diagnosis not present

## 2014-06-25 DIAGNOSIS — M5431 Sciatica, right side: Secondary | ICD-10-CM

## 2014-06-26 ENCOUNTER — Ambulatory Visit
Admission: RE | Admit: 2014-06-26 | Discharge: 2014-06-26 | Disposition: A | Discharge status: Home / Self Care | Payer: Medicare Other | Source: Ambulatory Visit | Attending: Geriatric Medicine | Admitting: Geriatric Medicine

## 2014-06-26 DIAGNOSIS — M5431 Sciatica, right side: Secondary | ICD-10-CM

## 2014-06-26 DIAGNOSIS — Z8546 Personal history of malignant neoplasm of prostate: Secondary | ICD-10-CM | POA: Diagnosis not present

## 2014-06-26 DIAGNOSIS — M47817 Spondylosis without myelopathy or radiculopathy, lumbosacral region: Secondary | ICD-10-CM | POA: Diagnosis not present

## 2014-06-26 DIAGNOSIS — M5126 Other intervertebral disc displacement, lumbar region: Secondary | ICD-10-CM | POA: Diagnosis not present

## 2014-07-08 DIAGNOSIS — D692 Other nonthrombocytopenic purpura: Secondary | ICD-10-CM | POA: Diagnosis not present

## 2014-07-08 DIAGNOSIS — Z85828 Personal history of other malignant neoplasm of skin: Secondary | ICD-10-CM | POA: Diagnosis not present

## 2014-07-08 DIAGNOSIS — L821 Other seborrheic keratosis: Secondary | ICD-10-CM | POA: Diagnosis not present

## 2014-07-08 DIAGNOSIS — L570 Actinic keratosis: Secondary | ICD-10-CM | POA: Diagnosis not present

## 2014-07-08 DIAGNOSIS — D1801 Hemangioma of skin and subcutaneous tissue: Secondary | ICD-10-CM | POA: Diagnosis not present

## 2014-08-08 DIAGNOSIS — M1611 Unilateral primary osteoarthritis, right hip: Secondary | ICD-10-CM | POA: Diagnosis not present

## 2014-08-08 DIAGNOSIS — M25551 Pain in right hip: Secondary | ICD-10-CM | POA: Diagnosis not present

## 2014-08-14 DIAGNOSIS — M1611 Unilateral primary osteoarthritis, right hip: Secondary | ICD-10-CM | POA: Diagnosis not present

## 2014-08-14 DIAGNOSIS — M25551 Pain in right hip: Secondary | ICD-10-CM | POA: Diagnosis not present

## 2014-08-21 DIAGNOSIS — M791 Myalgia: Secondary | ICD-10-CM | POA: Diagnosis not present

## 2014-09-06 DIAGNOSIS — M25551 Pain in right hip: Secondary | ICD-10-CM | POA: Diagnosis not present

## 2014-09-06 DIAGNOSIS — M1611 Unilateral primary osteoarthritis, right hip: Secondary | ICD-10-CM | POA: Diagnosis not present

## 2014-09-19 DIAGNOSIS — C61 Malignant neoplasm of prostate: Secondary | ICD-10-CM | POA: Diagnosis not present

## 2014-09-25 DIAGNOSIS — M79604 Pain in right leg: Secondary | ICD-10-CM | POA: Diagnosis not present

## 2014-09-25 DIAGNOSIS — M25551 Pain in right hip: Secondary | ICD-10-CM | POA: Diagnosis not present

## 2014-09-30 DIAGNOSIS — M1611 Unilateral primary osteoarthritis, right hip: Secondary | ICD-10-CM | POA: Diagnosis not present

## 2014-09-30 DIAGNOSIS — M25551 Pain in right hip: Secondary | ICD-10-CM | POA: Diagnosis not present

## 2014-09-30 DIAGNOSIS — Z6841 Body Mass Index (BMI) 40.0 and over, adult: Secondary | ICD-10-CM | POA: Diagnosis not present

## 2014-09-30 DIAGNOSIS — Z880 Allergy status to penicillin: Secondary | ICD-10-CM | POA: Diagnosis not present

## 2014-09-30 DIAGNOSIS — Z923 Personal history of irradiation: Secondary | ICD-10-CM | POA: Diagnosis not present

## 2014-09-30 DIAGNOSIS — Z8546 Personal history of malignant neoplasm of prostate: Secondary | ICD-10-CM | POA: Diagnosis not present

## 2014-10-10 DIAGNOSIS — M1611 Unilateral primary osteoarthritis, right hip: Secondary | ICD-10-CM | POA: Diagnosis not present

## 2014-10-30 DIAGNOSIS — N390 Urinary tract infection, site not specified: Secondary | ICD-10-CM | POA: Diagnosis not present

## 2014-10-31 DIAGNOSIS — N3000 Acute cystitis without hematuria: Secondary | ICD-10-CM | POA: Diagnosis not present

## 2014-10-31 DIAGNOSIS — C61 Malignant neoplasm of prostate: Secondary | ICD-10-CM | POA: Diagnosis not present

## 2014-11-03 DIAGNOSIS — N3000 Acute cystitis without hematuria: Secondary | ICD-10-CM | POA: Insufficient documentation

## 2014-11-13 DIAGNOSIS — M1611 Unilateral primary osteoarthritis, right hip: Secondary | ICD-10-CM | POA: Diagnosis not present

## 2014-11-14 NOTE — H&P (Addendum)
CHIEF COMPLAINT:  Painful right hip.   HISTORY OF PRESENT ILLNESS:  [name] is a very pleasant, 81 year old, white male who is seen today for evaluation of his right hip and groin pain.  He was initially seen back in February 2016, and his x-rays at that time revealed bone-on-bone osteoarthritis of the right hip; however, his medical doctor felt as if he developed shingles, and that was the reason he was having pain in his buttock.  He never really had a rash.  He was placed on medications for the shingles.  He was seen then on March 05, 2014, that of about 4 weeks from apparent shingles and was noted then to have the x-rays revealing the osteoarthritis.  He began to continue to have problems with his right hip and groin, and it was felt that a possible corticosteroid injection would be helpful, and that occurred on August 14, 2014.  He returned and states that the injection was helpful for a day or two, but then the pain returned.  He did have skin eruptions and blisters in the right paralumbar region, and those had all resolved.  He has continued to have pain and discomfort.  He is quite a risk, and finally though he got to the point that despite all conservative treatment, his pain was worsening.  He would like to proceed with a right total hip arthroplasty.  He states that he has pain with every step as well as night time pain.  He has difficulty with his activities of daily living.  For ambulation, he has difficulties.  He has had corticosteroid injections and rest but still continues to have significant pain and discomfort.  He is seen today for evaluation.   CURRENT MEDICATIONS:  1.  Triamterene/hydrochlorothiazide 75/50 mg 1 daily. 2.  Cipro 500 mg every 12 hours, but he has backed it down to once a day for a urinary tract infection previously noted. 3.  Tramadol HCL 50 mg as needed 3 times a day. 4.  Ketoconazole cream as needed. 5.  Swedish Bitters 1 daily in the morning, about 3  teaspoons.   ALLERGIES:  PENICILLIN OF WHICH HE HAD BROKEN OUT WITH A RASH AND HAD DIFFICULTY WITH BREATHING.  HE ALSO IS ALLERGIC TO CHICKEN, [place], GOOSE, DUCK, ETC.   PAST MEDICAL HISTORY:  His past medical history and general health is fair.   PAST SURGICAL HISTORY AND HOSPITALIZATIONS: 1.  In 1941, appendectomy. 2.  In 1970, hemorrhoidectomy. 3.  In 1990, colonoscopy. 4.  In 2003, diverticulitis and surgical repair. 5.  In 2006, colonoscopy. 6.  Prostate cancer and surgery with seeds.   SOCIAL HISTORY:  Reveals an 81 year old, white, married male who is a retired Research scientist (medical) for [employer]  He quit smoking cigarettes 51 years ago.  Prior to that, he smoked a 1/2 pack of cigarettes for 15 years.  He does not drink.   FAMILY HISTORY:  Positive for the mother who died at the age of 66.  She was on hemodialysis for 8 years.  She also had gout.  The father was murdered.  The brother at the age of 11 had died, and he was also on hemodialysis.  He did have prostate cancer.  He has a sister who is healthy at age 20.   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  He has had pneumonia x2.  He does have  a history of gout.  He has had a recent UTI, which he states that the symptoms have improved.   PHYSICAL EXAMINATION:  Reveals an 81 year old, white male who is well developed, well nourished, obese, alert, pleasant, cooperative, and in moderate distress secondary to right hip pain.  He is 67.5 inches.  His weight is 275 pounds.  BMI is 42.4.  Vital signs:  Reveal temperature 98.2, pulse 97, respirations 16, and blood pressure 148/88.  Eyes:  Pupils are equal, round, and reactive to light and accommodation with extraocular movements intact. Ears, nose, and throat:  Benign. Neck:  Supple, and no bruits were noted. Chest:  Good expansion. Lungs:  Essentially clear. Cardiac:  Regular  rhythm and rate.  Normal S1 and S1 and distant heart sounds.  Positive grade 1/6 systolic murmur. Pulses:  They are 1+ in the dorsalis pedis bilaterally. Abdomen:  Obese, soft, and nontender with no mass palpable.  Normal bowel sounds present. GU:  Not indicated for the orthopedic evaluation. Rectal:  Not indicated for the orthopedic evaluation. Breast:  Not indicated for the orthopedic evaluation. CNS:  He is oriented x3, and cranial nerves II-XII are grossly intact. Musculoskeletal:  Today, he has marked decrease of range of motion to about 5 degrees of internal and external rotation from a neutral position.  He does walk with an antalgic limp.  He appears to be a little bit shorter on the right in comparison to the left.  Calf is supple and nontender.  He is neurovascularly intact distally.   RADIOGRAPHS:  Radiographic studies reveal bone on bone joint space narrowing of the right hip.  He does have periarticular spurring both off of the superior acetabulum as well as the superior femoral head.  There are some cystic changes noted in the femoral head, and there is also marked sclerosing of the acetabulum and femoral head.  There are prosthetic seeds noted.   CLINICAL IMPRESSION:   1.  End stage osteoarthritis of the right hip. 2.  History of prostate cancer with seeds. 3.  History of diverticulitis. 4.  Recent UTI.   RECOMMENDATIONS:   1.  At this time, I have reviewed a note from Dr. Felipa Eth who felt that he would be a candidate for a total hip replacement from a medical and cardiac standpoint.  Therefore at this time, we feel that proceeding with a total hip replacement on the right would be indicated.   Spoke with Dr Felipa Eth about the BUN, Cret, and GFR.  He states that this is his baseline and can proceed with surgery Mike Craze. [place], [place] [phone]  11/14/2014 12:20 PM

## 2014-11-15 ENCOUNTER — Encounter (HOSPITAL_COMMUNITY): Payer: Self-pay

## 2014-11-15 ENCOUNTER — Encounter (HOSPITAL_COMMUNITY)
Admission: RE | Admit: 2014-11-15 | Discharge: 2014-11-15 | Disposition: A | Discharge status: Home / Self Care | Payer: Medicare Other | Source: Ambulatory Visit | Attending: Orthopedic Surgery | Admitting: Orthopedic Surgery

## 2014-11-15 ENCOUNTER — Encounter (HOSPITAL_COMMUNITY)
Admission: RE | Admit: 2014-11-15 | Discharge: 2014-11-15 | Disposition: A | Discharge status: Home / Self Care | Payer: Medicare Other | Source: Ambulatory Visit | Attending: Orthopaedic Surgery | Admitting: Orthopaedic Surgery

## 2014-11-15 DIAGNOSIS — M1611 Unilateral primary osteoarthritis, right hip: Secondary | ICD-10-CM | POA: Diagnosis not present

## 2014-11-15 DIAGNOSIS — Z01818 Encounter for other preprocedural examination: Secondary | ICD-10-CM | POA: Insufficient documentation

## 2014-11-15 DIAGNOSIS — Z0183 Encounter for blood typing: Secondary | ICD-10-CM | POA: Insufficient documentation

## 2014-11-15 DIAGNOSIS — Z01812 Encounter for preprocedural laboratory examination: Secondary | ICD-10-CM | POA: Insufficient documentation

## 2014-11-15 DIAGNOSIS — J40 Bronchitis, not specified as acute or chronic: Secondary | ICD-10-CM | POA: Diagnosis not present

## 2014-11-15 HISTORY — DX: Personal history of other diseases of the musculoskeletal system and connective tissue: Z87.39

## 2014-11-15 HISTORY — DX: Pneumonia, unspecified organism: J18.9

## 2014-11-15 HISTORY — DX: Localized edema: R60.0

## 2014-11-15 HISTORY — DX: Tinnitus, unspecified ear: H93.19

## 2014-11-15 HISTORY — DX: Unspecified cataract: H26.9

## 2014-11-15 HISTORY — DX: Effusion, unspecified joint: M25.40

## 2014-11-15 HISTORY — DX: Unspecified hearing loss, unspecified ear: H91.90

## 2014-11-15 HISTORY — DX: Cramp and spasm: R25.2

## 2014-11-15 HISTORY — DX: Spontaneous ecchymoses: R23.3

## 2014-11-15 HISTORY — DX: Urinary tract infection, site not specified: N39.0

## 2014-11-15 HISTORY — DX: Constipation, unspecified: K59.00

## 2014-11-15 HISTORY — DX: Personal history of colonic polyps: Z86.010

## 2014-11-15 HISTORY — DX: Edema, unspecified: R60.9

## 2014-11-15 HISTORY — DX: Pain in unspecified joint: M25.50

## 2014-11-15 HISTORY — DX: Personal history of colon polyps, unspecified: Z86.0100

## 2014-11-15 HISTORY — DX: Diverticulitis of intestine, part unspecified, without perforation or abscess without bleeding: K57.92

## 2014-11-15 HISTORY — DX: Other skin changes: R23.8

## 2014-11-15 LAB — URINALYSIS, ROUTINE W REFLEX MICROSCOPIC
Bilirubin Urine: NEGATIVE
Glucose, UA: NEGATIVE mg/dL
HGB URINE DIPSTICK: NEGATIVE
Ketones, ur: NEGATIVE mg/dL
LEUKOCYTES UA: NEGATIVE
Nitrite: NEGATIVE
PROTEIN: NEGATIVE mg/dL
Specific Gravity, Urine: 1.016 (ref 1.005–1.030)
UROBILINOGEN UA: 0.2 mg/dL (ref 0.0–1.0)
pH: 5 (ref 5.0–8.0)

## 2014-11-15 LAB — COMPREHENSIVE METABOLIC PANEL
ALBUMIN: 3.6 g/dL (ref 3.5–5.0)
ALK PHOS: 48 U/L (ref 38–126)
ALT: 17 U/L (ref 17–63)
AST: 22 U/L (ref 15–41)
Anion gap: 8 (ref 5–15)
BILIRUBIN TOTAL: 0.8 mg/dL (ref 0.3–1.2)
BUN: 16 mg/dL (ref 6–20)
CALCIUM: 9.8 mg/dL (ref 8.9–10.3)
CO2: 26 mmol/L (ref 22–32)
CREATININE: 1.61 mg/dL — AB (ref 0.61–1.24)
Chloride: 108 mmol/L (ref 101–111)
GFR calc Af Amer: 44 mL/min — ABNORMAL LOW (ref >60)
GFR calc non Af Amer: 38 mL/min — ABNORMAL LOW (ref >60)
GLUCOSE: 104 mg/dL — AB (ref 65–99)
Potassium: 3.8 mmol/L (ref 3.5–5.1)
Sodium: 142 mmol/L (ref 135–145)
TOTAL PROTEIN: 6.7 g/dL (ref 6.5–8.1)

## 2014-11-15 LAB — SURGICAL PCR SCREEN
MRSA, PCR: NEGATIVE
STAPHYLOCOCCUS AUREUS: NEGATIVE

## 2014-11-15 LAB — CBC WITH DIFFERENTIAL/PLATELET
BASOS ABS: 0 10*3/uL (ref 0.0–0.1)
BASOS PCT: 1 %
Eosinophils Absolute: 0.1 10*3/uL (ref 0.0–0.7)
Eosinophils Relative: 2 %
HEMATOCRIT: 43.5 % (ref 39.0–52.0)
HEMOGLOBIN: 14.4 g/dL (ref 13.0–17.0)
Lymphocytes Relative: 25 %
Lymphs Abs: 1.8 10*3/uL (ref 0.7–4.0)
MCH: 29.3 pg (ref 26.0–34.0)
MCHC: 33.1 g/dL (ref 30.0–36.0)
MCV: 88.6 fL (ref 78.0–100.0)
MONOS PCT: 9 %
Monocytes Absolute: 0.7 10*3/uL (ref 0.1–1.0)
NEUTROS ABS: 4.5 10*3/uL (ref 1.7–7.7)
NEUTROS PCT: 63 %
Platelets: 285 10*3/uL (ref 150–400)
RBC: 4.91 MIL/uL (ref 4.22–5.81)
RDW: 12.8 % (ref 11.5–15.5)
WBC: 7.1 10*3/uL (ref 4.0–10.5)

## 2014-11-15 LAB — ABO/RH: ABO/RH(D): A POS

## 2014-11-15 LAB — APTT: aPTT: 38 seconds — ABNORMAL HIGH (ref 24–37)

## 2014-11-15 LAB — TYPE AND SCREEN
ABO/RH(D): A POS
Antibody Screen: NEGATIVE

## 2014-11-15 LAB — PROTIME-INR
INR: 1.13 (ref 0.00–1.49)
Prothrombin Time: 14.7 seconds (ref 11.6–15.2)

## 2014-11-15 MED ORDER — CHLORHEXIDINE GLUCONATE 4 % EX LIQD: 60.0000 mL | Freq: Once | CUTANEOUS | Status: DC

## 2014-11-15 NOTE — Progress Notes (Addendum)
Medical Md is Dr.Hal Mocanaqua  Cardiologist  Denies having one  Echo > 10 yrs ago  Stress test > 10 yrs ago  Heart cath denies ever having one  EKG to be requested from [place]  CXR denies having one in the past yr

## 2014-11-15 NOTE — Pre-Procedure Instructions (Signed)
Christopher [name]  11/15/2014      [facility] [place], [place] - [address] [place] [place] [address] Phone: [phone] Fax: [phone]    Your procedure is scheduled on Tues, Nov 15 @ 7:15 AM  Report to [facility] Admitting at 5:30 AM  Call this number if you have problems the morning of surgery:  [phone]   Remember:  Do not eat food or drink liquids after midnight.  Take these medicines the morning of surgery with A SIP OF WATER Tramadol(Ultram-if needed)             Stop taking your Naproxen. No Goody's,BC's,Aspirin,Ibuprofen,Motrin,Advil,Fish Oil,or any Herbal Medications.    Do not wear jewelry.  Do not wear lotions, powders, or colognes.  You may wear deodorant.  Men may shave face and neck.  Do not bring valuables to the hospital.  [facility] is not responsible for any belongings or valuables.  Contacts, dentures or bridgework may not be worn into surgery.  Leave your suitcase in the car.  After surgery it may be brought to your room.  For patients admitted to the hospital, discharge time will be determined by your treatment team.  Patients discharged the day of surgery will not be allowed to drive home.    Special instructions:  [place] - Preparing for Surgery  Before surgery, you can play an important role.  Because skin is not sterile, your skin needs to be as free of germs as possible.  You can reduce the number of germs on you skin by washing with CHG (chlorahexidine gluconate) soap before surgery.  CHG is an antiseptic cleaner which kills germs and bonds with the skin to continue killing germs even after washing.  Please DO NOT use if you have an allergy to CHG or antibacterial soaps.  If your skin becomes reddened/irritated stop using the CHG and inform your nurse when you arrive at Short Stay.  Do not shave (including legs and underarms) for at least 48 hours prior to the first CHG shower.   You may shave your face.  Please follow these instructions carefully:   1.  Shower with CHG Soap the night before surgery and the                                morning of Surgery.  2.  If you choose to wash your hair, wash your hair first as usual with your       normal shampoo.  3.  After you shampoo, rinse your hair and body thoroughly to remove the                      Shampoo.  4.  Use CHG as you would any other liquid soap.  You can apply chg directly       to the skin and wash gently with scrungie or a clean washcloth.  5.  Apply the CHG Soap to your body ONLY FROM THE NECK DOWN.        Do not use on open wounds or open sores.  Avoid contact with your eyes,       ears, mouth and genitals (private parts).  Wash genitals (private parts)       with your normal soap.  6.  Wash thoroughly, paying special attention to the area where your surgery  will be performed.  7.  Thoroughly rinse your body with warm water from the neck down.  8.  DO NOT shower/wash with your normal soap after using and rinsing off       the CHG Soap.  9.  Pat yourself dry with a clean towel.            10.  Wear clean pajamas.            11.  Place clean sheets on your bed the night of your first shower and do not        sleep with pets.  Day of Surgery  Do not apply any lotions/deoderants the morning of surgery.  Please wear clean clothes to the hospital/surgery center.    Please read over the following fact sheets that you were given. Pain Booklet, Coughing and Deep Breathing, Blood Transfusion Information, MRSA Information and Surgical Site Infection Prevention

## 2014-11-15 NOTE — Progress Notes (Signed)
   11/15/14 0928  OBSTRUCTIVE SLEEP APNEA  Have you ever been diagnosed with sleep apnea through a sleep study? No  Do you snore loudly (loud enough to be heard through closed doors)?  0  Do you often feel tired, fatigued, or sleepy during the daytime (such as falling asleep during driving or talking to someone)? 0  Has anyone observed you stop breathing during your sleep? 0  Do you have, or are you being treated for high blood pressure? 0  BMI more than 35 kg/m2? 1  Age > 50 (1-yes) 1  Neck circumference greater than:Male 16 inches or larger, Male 17inches or larger? 1 (71)  Male Gender (Yes=1) 1  Obstructive Sleep Apnea Score 4  Score 5 or greater  Results sent to PCP

## 2014-11-16 LAB — URINE CULTURE: Culture: 2000

## 2014-11-20 DIAGNOSIS — H6120 Impacted cerumen, unspecified ear: Secondary | ICD-10-CM | POA: Diagnosis not present

## 2014-11-21 DIAGNOSIS — H9191 Unspecified hearing loss, right ear: Secondary | ICD-10-CM | POA: Diagnosis not present

## 2014-11-21 DIAGNOSIS — H6121 Impacted cerumen, right ear: Secondary | ICD-10-CM | POA: Diagnosis not present

## 2014-11-25 MED ORDER — VANCOMYCIN HCL 10 G IV SOLR
1500.0000 mg | INTRAVENOUS | Status: AC
  Administered 2014-11-26: 1500 mg via INTRAVENOUS
  Filled 2014-11-25: qty 1500

## 2014-11-26 ENCOUNTER — Inpatient Hospital Stay (HOSPITAL_COMMUNITY): Payer: Medicare Other | Admitting: Certified Registered Nurse Anesthetist

## 2014-11-26 ENCOUNTER — Inpatient Hospital Stay (HOSPITAL_COMMUNITY): Payer: Medicare Other

## 2014-11-26 ENCOUNTER — Inpatient Hospital Stay (HOSPITAL_COMMUNITY)
Admission: RE | Admit: 2014-11-26 | Discharge: 2014-11-29 | DRG: 470 | Disposition: A | Discharge status: Skilled Nursing Facility | Payer: Medicare Other | Source: Ambulatory Visit | Attending: Orthopaedic Surgery | Admitting: Orthopaedic Surgery

## 2014-11-26 ENCOUNTER — Encounter (HOSPITAL_COMMUNITY)
Admission: RE | Disposition: A | Discharge status: Skilled Nursing Facility | Payer: Self-pay | Source: Ambulatory Visit | Attending: Orthopaedic Surgery

## 2014-11-26 ENCOUNTER — Encounter (HOSPITAL_COMMUNITY): Payer: Self-pay | Admitting: Certified Registered Nurse Anesthetist

## 2014-11-26 DIAGNOSIS — M109 Gout, unspecified: Secondary | ICD-10-CM | POA: Diagnosis present

## 2014-11-26 DIAGNOSIS — N189 Chronic kidney disease, unspecified: Secondary | ICD-10-CM | POA: Diagnosis present

## 2014-11-26 DIAGNOSIS — R609 Edema, unspecified: Secondary | ICD-10-CM | POA: Diagnosis not present

## 2014-11-26 DIAGNOSIS — M79661 Pain in right lower leg: Secondary | ICD-10-CM

## 2014-11-26 DIAGNOSIS — K5900 Constipation, unspecified: Secondary | ICD-10-CM | POA: Diagnosis present

## 2014-11-26 DIAGNOSIS — M1611 Unilateral primary osteoarthritis, right hip: Principal | ICD-10-CM | POA: Diagnosis present

## 2014-11-26 DIAGNOSIS — Z880 Allergy status to penicillin: Secondary | ICD-10-CM | POA: Diagnosis not present

## 2014-11-26 DIAGNOSIS — Z923 Personal history of irradiation: Secondary | ICD-10-CM | POA: Diagnosis not present

## 2014-11-26 DIAGNOSIS — Z9889 Other specified postprocedural states: Secondary | ICD-10-CM

## 2014-11-26 DIAGNOSIS — Z87891 Personal history of nicotine dependence: Secondary | ICD-10-CM | POA: Diagnosis not present

## 2014-11-26 DIAGNOSIS — M25551 Pain in right hip: Secondary | ICD-10-CM | POA: Diagnosis not present

## 2014-11-26 DIAGNOSIS — Z96641 Presence of right artificial hip joint: Secondary | ICD-10-CM | POA: Diagnosis not present

## 2014-11-26 DIAGNOSIS — Z87440 Personal history of urinary (tract) infections: Secondary | ICD-10-CM | POA: Diagnosis not present

## 2014-11-26 DIAGNOSIS — Z96649 Presence of unspecified artificial hip joint: Secondary | ICD-10-CM

## 2014-11-26 DIAGNOSIS — Z8546 Personal history of malignant neoplasm of prostate: Secondary | ICD-10-CM

## 2014-11-26 DIAGNOSIS — I739 Peripheral vascular disease, unspecified: Secondary | ICD-10-CM | POA: Diagnosis present

## 2014-11-26 DIAGNOSIS — M659 Synovitis and tenosynovitis, unspecified: Secondary | ICD-10-CM | POA: Diagnosis present

## 2014-11-26 DIAGNOSIS — N390 Urinary tract infection, site not specified: Secondary | ICD-10-CM | POA: Diagnosis not present

## 2014-11-26 DIAGNOSIS — Z6841 Body Mass Index (BMI) 40.0 and over, adult: Secondary | ICD-10-CM | POA: Diagnosis not present

## 2014-11-26 DIAGNOSIS — M169 Osteoarthritis of hip, unspecified: Secondary | ICD-10-CM | POA: Diagnosis not present

## 2014-11-26 DIAGNOSIS — R2681 Unsteadiness on feet: Secondary | ICD-10-CM | POA: Diagnosis not present

## 2014-11-26 DIAGNOSIS — M6281 Muscle weakness (generalized): Secondary | ICD-10-CM | POA: Diagnosis not present

## 2014-11-26 DIAGNOSIS — Z91018 Allergy to other foods: Secondary | ICD-10-CM

## 2014-11-26 DIAGNOSIS — Z471 Aftercare following joint replacement surgery: Secondary | ICD-10-CM | POA: Diagnosis not present

## 2014-11-26 DIAGNOSIS — H268 Other specified cataract: Secondary | ICD-10-CM | POA: Diagnosis present

## 2014-11-26 HISTORY — PX: TOTAL HIP ARTHROPLASTY: SHX124

## 2014-11-26 SURGERY — ARTHROPLASTY, HIP, TOTAL,POSTERIOR APPROACH
Anesthesia: Monitor Anesthesia Care | Site: Hip | Laterality: Right

## 2014-11-26 MED ORDER — POLYETHYLENE GLYCOL 3350 17 G PO PACK: 17.0000 g | PACK | Freq: Every day | ORAL | Status: DC | PRN

## 2014-11-26 MED ORDER — ACETAMINOPHEN 10 MG/ML IV SOLN
1000.0000 mg | Freq: Once | INTRAVENOUS | Status: AC
  Administered 2014-11-26: 1000 mg via INTRAVENOUS

## 2014-11-26 MED ORDER — ROCURONIUM BROMIDE 50 MG/5ML IV SOLN
INTRAVENOUS | Status: AC
Start: 2014-11-26 — End: 2014-11-26
  Filled 2014-11-26: qty 1

## 2014-11-26 MED ORDER — MENTHOL 3 MG MT LOZG: 1.0000 | LOZENGE | OROMUCOSAL | Status: DC | PRN

## 2014-11-26 MED ORDER — RIVAROXABAN 10 MG PO TABS
10.0000 mg | ORAL_TABLET | Freq: Every day | ORAL | Status: DC
  Administered 2014-11-27 – 2014-11-29 (×3): 10 mg via ORAL
  Filled 2014-11-26 (×2): qty 1

## 2014-11-26 MED ORDER — METOCLOPRAMIDE HCL 5 MG/ML IJ SOLN: 5.0000 mg | Freq: Three times a day (TID) | INTRAMUSCULAR | Status: DC | PRN

## 2014-11-26 MED ORDER — PHENYLEPHRINE HCL 10 MG/ML IJ SOLN
INTRAMUSCULAR | Status: DC | PRN
  Administered 2014-11-26: 160 ug via INTRAVENOUS
  Administered 2014-11-26 (×2): 120 ug via INTRAVENOUS

## 2014-11-26 MED ORDER — FENTANYL CITRATE (PF) 100 MCG/2ML IJ SOLN
INTRAMUSCULAR | Status: DC | PRN
  Administered 2014-11-26: 100 ug via INTRAVENOUS
  Administered 2014-11-26: 50 ug via INTRAVENOUS

## 2014-11-26 MED ORDER — METHOCARBAMOL 500 MG PO TABS
ORAL_TABLET | ORAL | Status: AC
  Filled 2014-11-26: qty 1

## 2014-11-26 MED ORDER — HYDROCODONE-ACETAMINOPHEN 7.5-325 MG PO TABS: 1.0000 | ORAL_TABLET | Freq: Once | ORAL | Status: DC | PRN

## 2014-11-26 MED ORDER — HYDROMORPHONE HCL 1 MG/ML IJ SOLN: 0.2500 mg | INTRAMUSCULAR | Status: DC | PRN

## 2014-11-26 MED ORDER — PHENYLEPHRINE 40 MCG/ML (10ML) SYRINGE FOR IV PUSH (FOR BLOOD PRESSURE SUPPORT)
PREFILLED_SYRINGE | INTRAVENOUS | Status: AC
  Filled 2014-11-26: qty 10

## 2014-11-26 MED ORDER — PROPOFOL 500 MG/50ML IV EMUL
INTRAVENOUS | Status: DC | PRN
  Administered 2014-11-26: 60 ug/kg/min via INTRAVENOUS

## 2014-11-26 MED ORDER — METHOCARBAMOL 500 MG PO TABS
500.0000 mg | ORAL_TABLET | Freq: Four times a day (QID) | ORAL | Status: DC | PRN
  Administered 2014-11-26 – 2014-11-27 (×3): 500 mg via ORAL
  Filled 2014-11-26 (×2): qty 1

## 2014-11-26 MED ORDER — PROPOFOL 10 MG/ML IV BOLUS
INTRAVENOUS | Status: AC
  Filled 2014-11-26: qty 20

## 2014-11-26 MED ORDER — ONDANSETRON HCL 4 MG PO TABS: 4.0000 mg | ORAL_TABLET | Freq: Four times a day (QID) | ORAL | Status: DC | PRN

## 2014-11-26 MED ORDER — VANCOMYCIN HCL IN DEXTROSE 1-5 GM/200ML-% IV SOLN
1000.0000 mg | Freq: Two times a day (BID) | INTRAVENOUS | Status: AC
  Administered 2014-11-27: 1000 mg via INTRAVENOUS
  Filled 2014-11-26 (×2): qty 200

## 2014-11-26 MED ORDER — MIDAZOLAM HCL 2 MG/2ML IJ SOLN
INTRAMUSCULAR | Status: AC
Start: 2014-11-26 — End: 2014-11-26
  Filled 2014-11-26: qty 4

## 2014-11-26 MED ORDER — PHENOL 1.4 % MT LIQD: 1.0000 | OROMUCOSAL | Status: DC | PRN

## 2014-11-26 MED ORDER — 0.9 % SODIUM CHLORIDE (POUR BTL) OPTIME
TOPICAL | Status: DC | PRN
  Administered 2014-11-26: 1000 mL

## 2014-11-26 MED ORDER — FENTANYL CITRATE (PF) 250 MCG/5ML IJ SOLN
INTRAMUSCULAR | Status: AC
  Filled 2014-11-26: qty 5

## 2014-11-26 MED ORDER — SODIUM CHLORIDE 0.9 % IJ SOLN
INTRAMUSCULAR | Status: AC
  Filled 2014-11-26: qty 10

## 2014-11-26 MED ORDER — BUPIVACAINE HCL (PF) 0.5 % IJ SOLN
INTRAMUSCULAR | Status: DC | PRN
  Administered 2014-11-26: 3 mL via INTRATHECAL

## 2014-11-26 MED ORDER — PHENYLEPHRINE HCL 10 MG/ML IJ SOLN
10.0000 mg | INTRAVENOUS | Status: DC | PRN
  Administered 2014-11-26: 40 ug/min via INTRAVENOUS

## 2014-11-26 MED ORDER — BUPIVACAINE-EPINEPHRINE (PF) 0.25% -1:200000 IJ SOLN
INTRAMUSCULAR | Status: DC | PRN
  Administered 2014-11-26: 30 mL

## 2014-11-26 MED ORDER — BUPIVACAINE-EPINEPHRINE (PF) 0.25% -1:200000 IJ SOLN
INTRAMUSCULAR | Status: AC
  Filled 2014-11-26: qty 30

## 2014-11-26 MED ORDER — DIPHENHYDRAMINE HCL 12.5 MG/5ML PO ELIX: 12.5000 mg | ORAL_SOLUTION | ORAL | Status: DC | PRN

## 2014-11-26 MED ORDER — SUCCINYLCHOLINE CHLORIDE 20 MG/ML IJ SOLN
INTRAMUSCULAR | Status: AC
  Filled 2014-11-26: qty 1

## 2014-11-26 MED ORDER — MAGNESIUM CITRATE PO SOLN: 1.0000 | Freq: Once | ORAL | Status: DC | PRN

## 2014-11-26 MED ORDER — ALUM & MAG HYDROXIDE-SIMETH 200-200-20 MG/5ML PO SUSP: 30.0000 mL | ORAL | Status: DC | PRN

## 2014-11-26 MED ORDER — BISACODYL 10 MG RE SUPP: 10.0000 mg | Freq: Every day | RECTAL | Status: DC | PRN

## 2014-11-26 MED ORDER — TRANEXAMIC ACID 1000 MG/10ML IV SOLN
2000.0000 mg | INTRAVENOUS | Status: DC
  Filled 2014-11-26: qty 20

## 2014-11-26 MED ORDER — DOCUSATE SODIUM 100 MG PO CAPS
100.0000 mg | ORAL_CAPSULE | Freq: Two times a day (BID) | ORAL | Status: DC
  Administered 2014-11-26 – 2014-11-29 (×7): 100 mg via ORAL
  Filled 2014-11-26 (×7): qty 1

## 2014-11-26 MED ORDER — SODIUM CHLORIDE 0.9 % IV SOLN
75.0000 mL/h | INTRAVENOUS | Status: DC
  Administered 2014-11-26 – 2014-11-27 (×2): 75 mL/h via INTRAVENOUS

## 2014-11-26 MED ORDER — OXYCODONE HCL 5 MG PO TABS
5.0000 mg | ORAL_TABLET | ORAL | Status: DC | PRN
  Administered 2014-11-26 (×3): 5 mg via ORAL
  Administered 2014-11-26 – 2014-11-29 (×7): 10 mg via ORAL
  Filled 2014-11-26: qty 2
  Filled 2014-11-26: qty 1
  Filled 2014-11-26 (×7): qty 2

## 2014-11-26 MED ORDER — EPHEDRINE SULFATE 50 MG/ML IJ SOLN
INTRAMUSCULAR | Status: AC
Start: 2014-11-26 — End: 2014-11-26
  Filled 2014-11-26: qty 1

## 2014-11-26 MED ORDER — CLOTRIMAZOLE-BETAMETHASONE 1-0.05 % EX CREA
TOPICAL_CREAM | Freq: Two times a day (BID) | CUTANEOUS | Status: DC | PRN
  Filled 2014-11-26: qty 15

## 2014-11-26 MED ORDER — METHOCARBAMOL 1000 MG/10ML IJ SOLN
500.0000 mg | Freq: Four times a day (QID) | INTRAVENOUS | Status: DC | PRN
  Filled 2014-11-26: qty 5

## 2014-11-26 MED ORDER — SODIUM CHLORIDE 0.9 % IV SOLN: 75.0000 mL/h | INTRAVENOUS | Status: DC

## 2014-11-26 MED ORDER — ALBUMIN HUMAN 5 % IV SOLN
INTRAVENOUS | Status: DC | PRN
  Administered 2014-11-26: 09:00:00 via INTRAVENOUS

## 2014-11-26 MED ORDER — HYDROMORPHONE HCL 1 MG/ML IJ SOLN
0.5000 mg | INTRAMUSCULAR | Status: DC | PRN
  Administered 2014-11-29: 1 mg via INTRAVENOUS
  Filled 2014-11-26: qty 1

## 2014-11-26 MED ORDER — ONDANSETRON HCL 4 MG/2ML IJ SOLN: 4.0000 mg | Freq: Four times a day (QID) | INTRAMUSCULAR | Status: DC | PRN

## 2014-11-26 MED ORDER — ACETAMINOPHEN 10 MG/ML IV SOLN
1000.0000 mg | Freq: Four times a day (QID) | INTRAVENOUS | Status: AC
  Administered 2014-11-26 – 2014-11-27 (×4): 1000 mg via INTRAVENOUS
  Filled 2014-11-26 (×4): qty 100

## 2014-11-26 MED ORDER — TRANEXAMIC ACID 1000 MG/10ML IV SOLN
2000.0000 mg | INTRAVENOUS | Status: DC | PRN
  Administered 2014-11-26: 2000 mg via TOPICAL

## 2014-11-26 MED ORDER — LACTATED RINGERS IV SOLN
INTRAVENOUS | Status: DC | PRN
  Administered 2014-11-26 (×2): via INTRAVENOUS

## 2014-11-26 MED ORDER — KETOROLAC TROMETHAMINE 15 MG/ML IJ SOLN
7.5000 mg | Freq: Four times a day (QID) | INTRAMUSCULAR | Status: AC
  Administered 2014-11-26 – 2014-11-27 (×4): 7.5 mg via INTRAVENOUS
  Filled 2014-11-26 (×4): qty 1

## 2014-11-26 MED ORDER — TRIAMTERENE-HCTZ 75-50 MG PO TABS
1.0000 | ORAL_TABLET | Freq: Every day | ORAL | Status: DC
  Administered 2014-11-27 – 2014-11-29 (×3): 1 via ORAL
  Filled 2014-11-26 (×5): qty 1

## 2014-11-26 MED ORDER — OXYCODONE HCL 5 MG PO TABS
ORAL_TABLET | ORAL | Status: AC
  Filled 2014-11-26: qty 1

## 2014-11-26 MED ORDER — METOCLOPRAMIDE HCL 5 MG PO TABS: 5.0000 mg | ORAL_TABLET | Freq: Three times a day (TID) | ORAL | Status: DC | PRN

## 2014-11-26 MED ORDER — PROMETHAZINE HCL 25 MG/ML IJ SOLN: 6.2500 mg | INTRAMUSCULAR | Status: DC | PRN

## 2014-11-26 MED ORDER — ACETAMINOPHEN 10 MG/ML IV SOLN
1000.0000 mg | INTRAVENOUS | Status: DC
  Filled 2014-11-26: qty 100

## 2014-11-26 SURGICAL SUPPLY — 63 items
BLADE SAW SAG 73X25 THK (BLADE) ×2
BLADE SAW SGTL 73X25 THK (BLADE) ×1 IMPLANT
BRUSH FEMORAL CANAL (MISCELLANEOUS) IMPLANT
CAPT HIP TOTAL 2 ×3 IMPLANT
COVER SURGICAL LIGHT HANDLE (MISCELLANEOUS) ×3 IMPLANT
DRAPE INCISE IOBAN 66X45 STRL (DRAPES) IMPLANT
DRAPE ORTHO SPLIT 77X108 STRL (DRAPES) ×4
DRAPE PROXIMA HALF (DRAPES) ×3 IMPLANT
DRAPE SURG ORHT 6 SPLT 77X108 (DRAPES) ×2 IMPLANT
DRSG MEPILEX BORDER 4X12 (GAUZE/BANDAGES/DRESSINGS) ×3 IMPLANT
DURAPREP 26ML APPLICATOR (WOUND CARE) ×6 IMPLANT
ELECT BLADE 4.0 EZ CLEAN MEGAD (MISCELLANEOUS) ×3
ELECT BLADE 6.5 EXT (BLADE) IMPLANT
ELECT REM PT RETURN 9FT ADLT (ELECTROSURGICAL) ×3
ELECTRODE BLDE 4.0 EZ CLN MEGD (MISCELLANEOUS) ×1 IMPLANT
ELECTRODE REM PT RTRN 9FT ADLT (ELECTROSURGICAL) ×1 IMPLANT
EVACUATOR 1/8 PVC DRAIN (DRAIN) IMPLANT
FACESHIELD WRAPAROUND (MASK) ×9 IMPLANT
GLOVE BIO SURGEON STRL SZ 6.5 (GLOVE) ×2 IMPLANT
GLOVE BIO SURGEONS STRL SZ 6.5 (GLOVE) ×1
GLOVE BIOGEL PI IND STRL 7.0 (GLOVE) ×1 IMPLANT
GLOVE BIOGEL PI IND STRL 8 (GLOVE) ×2 IMPLANT
GLOVE BIOGEL PI IND STRL 8.5 (GLOVE) ×1 IMPLANT
GLOVE BIOGEL PI INDICATOR 7.0 (GLOVE) ×2
GLOVE BIOGEL PI INDICATOR 8 (GLOVE) ×4
GLOVE BIOGEL PI INDICATOR 8.5 (GLOVE) ×2
GLOVE ECLIPSE 8.0 STRL XLNG CF (GLOVE) ×6 IMPLANT
GLOVE SURG ORTHO 8.5 STRL (GLOVE) ×6 IMPLANT
GOWN STRL REUS W/ TWL LRG LVL3 (GOWN DISPOSABLE) ×2 IMPLANT
GOWN STRL REUS W/TWL 2XL LVL3 (GOWN DISPOSABLE) ×6 IMPLANT
GOWN STRL REUS W/TWL LRG LVL3 (GOWN DISPOSABLE) ×4
HANDPIECE INTERPULSE COAX TIP (DISPOSABLE)
IMMOBILIZER KNEE 20 (SOFTGOODS) IMPLANT
IMMOBILIZER KNEE 22 UNIV (SOFTGOODS) ×3 IMPLANT
KIT BASIN OR (CUSTOM PROCEDURE TRAY) ×3 IMPLANT
KIT ROOM TURNOVER OR (KITS) ×3 IMPLANT
MANIFOLD NEPTUNE II (INSTRUMENTS) ×3 IMPLANT
NEEDLE 22X1 1/2 (OR ONLY) (NEEDLE) ×3 IMPLANT
NS IRRIG 1000ML POUR BTL (IV SOLUTION) ×3 IMPLANT
PACK TOTAL JOINT (CUSTOM PROCEDURE TRAY) ×3 IMPLANT
PACK UNIVERSAL I (CUSTOM PROCEDURE TRAY) ×3 IMPLANT
PAD ARMBOARD 7.5X6 YLW CONV (MISCELLANEOUS) ×3 IMPLANT
PRESSURIZER FEMORAL UNIV (MISCELLANEOUS) IMPLANT
SET HNDPC FAN SPRY TIP SCT (DISPOSABLE) IMPLANT
STAPLER VISISTAT 35W (STAPLE) IMPLANT
SUCTION FRAZIER TIP 10 FR DISP (SUCTIONS) ×3 IMPLANT
SUT BONE WAX W31G (SUTURE) IMPLANT
SUT ETHIBOND NAB CT1 #1 30IN (SUTURE) ×9 IMPLANT
SUT MNCRL AB 3-0 PS2 18 (SUTURE) ×3 IMPLANT
SUT VIC AB 0 CT1 27 (SUTURE) ×4
SUT VIC AB 0 CT1 27XBRD ANBCTR (SUTURE) ×2 IMPLANT
SUT VIC AB 1 CT1 27 (SUTURE) ×4
SUT VIC AB 1 CT1 27XBRD ANBCTR (SUTURE) ×2 IMPLANT
SUT VIC AB 2-0 CT1 27 (SUTURE) ×2
SUT VIC AB 2-0 CT1 TAPERPNT 27 (SUTURE) ×1 IMPLANT
SYR CONTROL 10ML LL (SYRINGE) ×3 IMPLANT
TIP HIGH FLOW IRRIGATION COAX (MISCELLANEOUS) ×3 IMPLANT
TOWEL OR 17X24 6PK STRL BLUE (TOWEL DISPOSABLE) ×3 IMPLANT
TOWEL OR 17X26 10 PK STRL BLUE (TOWEL DISPOSABLE) ×3 IMPLANT
TOWER CARTRIDGE SMART MIX (DISPOSABLE) IMPLANT
TRAY FOLEY W/METER SILVER 14FR (SET/KITS/TRAYS/PACK) ×3 IMPLANT
WATER STERILE IRR 1000ML POUR (IV SOLUTION) ×12 IMPLANT
YANKAUER SUCT BULB TIP NO VENT (SUCTIONS) ×3 IMPLANT

## 2014-11-26 NOTE — Progress Notes (Signed)
Orthopedic Tech Progress Note Patient Details:  Arnette Lanoux Dec 17, 1928 [id number]  Ortho Devices Type of Ortho Device: Knee Immobilizer   [place] Pink 11/26/2014, 11:46 AM

## 2014-11-26 NOTE — Progress Notes (Signed)
Utilization review completed.  

## 2014-11-26 NOTE — Discharge Instructions (Signed)
Information on my medicine - XARELTO® (Rivaroxaban) ° °This medication education was reviewed with me or my healthcare representative as part of my discharge preparation.  The pharmacist that spoke with me during my hospital stay was:  Remy Dia A Stefana Lodico, RPH ° °Why was Xarelto® prescribed for you? °Xarelto® was prescribed for you to reduce the risk of blood clots forming after orthopedic surgery. The medical term for these abnormal blood clots is venous thromboembolism (VTE). ° °What do you need to know about xarelto® ? °Take your Xarelto® ONCE DAILY at the same time every day. °You may take it either with or without food. ° °If you have difficulty swallowing the tablet whole, you may crush it and mix in applesauce just prior to taking your dose. ° °Take Xarelto® exactly as prescribed by your doctor and DO NOT stop taking Xarelto® without talking to the doctor who prescribed the medication.  Stopping without other VTE prevention medication to take the place of Xarelto® may increase your risk of developing a clot. ° °After discharge, you should have regular check-up appointments with your healthcare provider that is prescribing your Xarelto®.   ° °What do you do if you miss a dose? °If you miss a dose, take it as soon as you remember on the same day then continue your regularly scheduled once daily regimen the next day. Do not take two doses of Xarelto® on the same day.  ° °Important Safety Information °A possible side effect of Xarelto® is bleeding. You should call your healthcare provider right away if you experience any of the following: °? Bleeding from an injury or your nose that does not stop. °? Unusual colored urine (red or dark brown) or unusual colored stools (red or black). °? Unusual bruising for unknown reasons. °? A serious fall or if you hit your head (even if there is no bleeding). ° °Some medicines may interact with Xarelto® and might increase your risk of bleeding while on Xarelto®. To help avoid  this, consult your healthcare provider or pharmacist prior to using any new prescription or non-prescription medications, including herbals, vitamins, non-steroidal anti-inflammatory drugs (NSAIDs) and supplements. ° °This website has more information on Xarelto®: www.xarelto.com. ° ° ° °

## 2014-11-26 NOTE — Op Note (Signed)
NAMEDESI, ROWE NO.:  [id number]  MEDICAL RECORD NO.:  [id number]  LOCATION:  [facility]                         FACILITY:  [facility]  PHYSICIAN:  Vonna Kotyk. Whitfield, M.D.DATE OF BIRTH:  26-Jul-1928  DATE OF PROCEDURE:  11/26/2014 DATE OF DISCHARGE:                              OPERATIVE REPORT   PREOPERATIVE DIAGNOSES: 1. Primary, end-stage osteoarthritis, right hip. 2. Morbid obesity.  POSTOPERATIVE DIAGNOSES: 1. Primary, end-stage osteoarthritis, right hip. 2. Morbid obesity.  PROCEDURE:  Right total hip replacement.  SURGEON:  Joni Fears, MD.  ASSISTANT:  Biagio Borg, PA-C.  ANESTHESIA:  Spinal with supplemental IV sedation.  COMPLICATIONS:  None.  COMPONENTS:  DePuy AML large stature 13.5 mm femoral stem, 36 mm outer diameter metallic hip ball with a 5 mm neck length, a 54 mm outer diameter Gription III acetabular component, apex hole eliminator, and a Marathon polyethylene liner, +4 with a 10-degree posterior lip.  Components were press-fit.  DESCRIPTION OF PROCEDURE:  Mr. Mcgeehan was met in the holding area with his family.  I identified the right hip as the appropriate operative site and marked it accordingly.  The patient was then transported to room #7.  Spinal anesthesia was performed by Anesthesia without difficulty.  The patient was then placed under IV sedation and placed in the lateral decubitus position with the right side up.  He was secured to the operating table with the Innomed hip system.  The right lower extremity was then prepped with chlorhexidine scrub from the iliac crest to the midcalf x1 and DuraPrep x2.  Sterile draping was performed.  Time- out was called.  A routine Southern incision was utilized and via sharp dissection, carried down to subcutaneous tissue.  There was abundant adipose tissue with the patient's BMI well over 42.  The incision was carried down to the iliotibial band.  Gross bleeders were Bovie  coagulated.  Self- retaining retractors were inserted.  The IT band was identified and incised along the length of the skin incision.  Retractors were placed more deeply.  There was abundant adipose tissue along the greater trochanter, even posterior to it.  This was carefully incised and retracted anteriorly and posteriorly with blunt retractors.  I could palpate the sciatic nerve and did so throughout the procedure and felt it was well out of harm's way.  The short external rotators were identified and incised near their attachment to the posterior aspect of the greater trochanter.  The capsule was identified, incised along the femoral neck and head. There was about a 5 mL clear yellow joint effusion with some synovitis.  The head was then easily dislocated posteriorly and then osteotomized at the femoral head and neck junction.  The head was removed.  There was about 80% of the head that was devoid of articular cartilage and eburnated.  There were subchondral cysts, and they were obviously misshapen.  The piriformis fossa was identified.  A starter hole was then made. Hand reaming was performed to be sure I was well in the canal, and I reamed to a 13.5 mm, i.e. line to line and then rasped sequentially from a 10.5 to a 12 and a 13.5 large  stature femoral component with a very nice fit on the calcar without any toggling.  I was approximately a fingerbreadth proximal to the lesser trochanter.  Retractors were then placed about the acetabulum.  There was abundant labrum and synovitis that was resected.  Reaming was performed sequentially from 46-53 to accept a 54 component.  I trialed a 52 component which would completely seat with a good rim fit, and the 54 that had good rim fit but would not completely seat.  At that point, I impacted the final Gription III acetabular component, snug into the acetabulum and would not move, and therefore I felt I did not need supplementary  fixation with screws.  I did use the external guide for appropriate angle placement.  The trial Marathon liner was then inserted, followed by the 13.5 mm large stature rasp.  We trialed several head and neck lengths.  We decided on a 36 mm outer diameter hip ball with a 5 mm neck length that re-established leg lengths.  We had perfect stability without any toggling.  The trial components were removed.  We copiously irrigated the joint with saline solution.  We inserted an apex hole eliminator, followed by the Marathon polyethylene liner, +4 with a 10-degree posterior lip.  The 13.5 mm large stature femoral component was impacted and flushed on the calcar.  Morse tapered neck was cleaned, and the 36 mm metallic hip ball was applied with a 5 mm neck length.  We checked the acetabulum to make sure it was clear, reduced the entire construct; and with perfect stability, we thought leg lengths were symmetrical.  The capsule was closed anatomically with a running #1 Ethibond.  Short external rotators were then sutured with the same material.  We placed topical tranexamic acid into the wound.  The IT band was then closed with a running #1 Vicryl.  Subcutaneous tissue was closed with 0 Vicryl, 2-0 Vicryl, and then 3-0 Monocryl. Skin closed with skin clips.  Sterile bulky dressing was applied.  The patient was then placed supine.  A knee immobilizer applied to the right lower extremity.  He was then carefully transported to the operating stretcher and then to the postanesthesia recovery room without complications.  We did insert a Foley catheter preoperatively.  The urine was clear.     Vonna Kotyk. Durward Fortes, M.D.     PWW/MEDQ  D:  11/26/2014  T:  11/26/2014  Job:  [id number]

## 2014-11-26 NOTE — Transfer of Care (Signed)
Immediate Anesthesia Transfer of Care Note  Patient: Christopher [name]  Procedure(s) Performed: Procedure(s): TOTAL HIP ARTHROPLASTY (Right)  Patient Location: PACU  Anesthesia Type:MAC and Spinal  Level of Consciousness: awake, alert , oriented and patient cooperative  Airway & Oxygen Therapy: Patient Spontanous Breathing  Post-op Assessment: Report given to RN and Post -op Vital signs reviewed and stable  Post vital signs: Reviewed and stable  Last Vitals:  Filed Vitals:   11/26/14 0613  BP: 150/80  Pulse: 73  Resp: 20    Complications: No apparent anesthesia complications

## 2014-11-26 NOTE — Anesthesia Postprocedure Evaluation (Signed)
  Anesthesia Post-op Note  Patient: Christopher [name]  Procedure(s) Performed: Procedure(s): TOTAL HIP ARTHROPLASTY (Right)  Patient Location: PACU  Anesthesia Type:MAC and Spinal  Level of Consciousness: awake and alert   Airway and Oxygen Therapy: Patient Spontanous Breathing  Post-op Pain: mild  Post-op Assessment: Post-op Vital signs reviewed LLE Motor Response: Purposeful movement, Responds to commands LLE Sensation: Full sensation RLE Motor Response: Purposeful movement, Responds to commands RLE Sensation: Full sensation L Sensory Level: L5-Outer lower leg, top of foot, great toe R Sensory Level: L4-Anterior knee, lower leg  Post-op Vital Signs: Reviewed  Last Vitals:  Filed Vitals:   11/26/14 1100  BP:   Pulse: 61  Temp: 36.4 C  Resp: 16    Complications: No apparent anesthesia complications

## 2014-11-26 NOTE — Anesthesia Procedure Notes (Addendum)
Procedure Name: MAC Date/Time: 11/26/2014 7:13 AM Performed by: Salli Quarry WEAVER Pre-anesthesia Checklist: Patient identified, Emergency Drugs available, Suction available and Patient being monitored Patient Re-evaluated:Patient Re-evaluated prior to inductionOxygen Delivery Method: Simple face mask Preoxygenation: Pre-oxygenation with 100% oxygen   Spinal Patient location during procedure: OR Staffing Anesthesiologist: Suzette Battiest Performed by: anesthesiologist  Preanesthetic Checklist Completed: patient identified, site marked, surgical consent, pre-op evaluation, timeout performed, IV checked, risks and benefits discussed and monitors and equipment checked Spinal Block Patient position: sitting Prep: site prepped and draped and DuraPrep Patient monitoring: heart rate, continuous pulse ox and blood pressure Approach: midline Location: L4-5 Injection technique: single-shot Needle Needle type: Pencan  Needle gauge: 24 G Needle length: 9 cm Additional Notes Expiration date of kit checked and confirmed. Patient tolerated procedure well, without complications.

## 2014-11-26 NOTE — Evaluation (Signed)
Physical Therapy Evaluation Patient Details Name: Christopher [name] MRN: [id number] DOB: 23-Oct-1928 Today's Date: 11/26/2014   History of Present Illness  Pt admitted for R posterior THA with PMHx of prostate CA  Clinical Impression  Pt illustrated good affect and performed therapeutic activity well for POD#0. Pt evaluation revealed generalized weakness, decreased ROM, and decreased capacity to perform functional mobility. Pt would benefit from skilled PT services to improve gait, R LE extremity strength and endurance, and independence to decrease burden of care. Pt anticipating D/C to [facility].    Follow Up Recommendations SNF    Equipment Recommendations  None recommended by PT    Recommendations for Other Services OT consult     Precautions / Restrictions Precautions Precautions: Posterior Hip;Fall Restrictions RLE Weight Bearing: Weight bearing as tolerated      Mobility  Bed Mobility Overal bed mobility: Needs Assistance Bed Mobility: Supine to Sit     Supine to sit: Mod assist;HOB elevated     General bed mobility comments: HOB 30, assist with pivoting and blocking of R LE, assisted with trunk elevation, cues for sequence and precautions  Transfers Overall transfer level: Needs assistance   Transfers: Sit to/from Stand Sit to Stand: Min assist         General transfer comment: assisting with anterior translation and rising to stand, cues for maintain precautions, blocking R LE, hand placement and safety  Ambulation/Gait Ambulation/Gait assistance: Min guard Ambulation Distance (Feet): 100 Feet Assistive device: Rolling walker (2 wheeled) Gait Pattern/deviations: Step-through pattern;Decreased stride length   Gait velocity interpretation: Below normal speed for age/gender General Gait Details: cues for maintaining posture, looking up while walking, and to avoid IR while turning  Stairs            Wheelchair Mobility    Modified Rankin (Stroke  Patients Only)       Balance Overall balance assessment: Needs assistance   Sitting balance-Leahy Scale: Good       Standing balance-Leahy Scale: Poor                               Pertinent Vitals/Pain Pain Assessment: No/denies pain    Home Living Family/patient expects to be discharged to:: Private residence Living Arrangements: Spouse/significant other Available Help at Discharge: Family;Available 24 hours/day Type of Home: House Home Access: Stairs to enter   [employer] of Steps: 2 Home Layout: Two level;Able to live on main level with bedroom/bathroom Home Equipment: Gilford Rile - 2 wheels;Cane - single point;Grab bars - toilet;Grab bars - tub/shower;Shower seat Additional Comments: one step down to the living room on the main level    Prior Function Level of Independence: Independent               Hand Dominance        Extremity/Trunk Assessment   Upper Extremity Assessment: Overall WFL for tasks assessed           Lower Extremity Assessment: RLE deficits/detail;Generalized weakness RLE Deficits / Details: As expected weakness and decreased ROM following post-op    Cervical / Trunk Assessment: Kyphotic  Communication   Communication: HOH  Cognition Arousal/Alertness: Awake/alert Behavior During Therapy: WFL for tasks assessed/performed Overall Cognitive Status: Within Functional Limits for tasks assessed                      General Comments      Exercises Total Joint Exercises Quad Sets: Strengthening;Right;10 reps;Supine;AROM  Gluteal Sets: Strengthening;Both;10 reps;AROM;Supine Heel Slides: AROM;Right;10 reps;Supine      Assessment/Plan    PT Assessment Patient needs continued PT services  PT Diagnosis Difficulty walking;Generalized weakness   PT Problem List Decreased strength;Decreased range of motion;Decreased activity tolerance;Decreased mobility;Decreased knowledge of use of DME;Decreased  knowledge of precautions  PT Treatment Interventions DME instruction;Gait training;Therapeutic activities;Therapeutic exercise;Patient/family education;Functional mobility training   PT Goals (Current goals can be found in the Care Plan section) Acute Rehab PT Goals Patient Stated Goal: Pt wishes to get back home to wife PT Goal Formulation: With patient Time For Goal Achievement: 12/03/14 Potential to Achieve Goals: Good    Frequency 7X/week   Barriers to discharge Decreased caregiver support      Co-evaluation               End of Session Equipment Utilized During Treatment: Gait belt;Right knee immobilizer (KI reapplied end of session) Activity Tolerance: Patient tolerated treatment well Patient left: in chair;with call bell/phone within reach;with chair alarm set Nurse Communication: Mobility status;Precautions;Weight bearing status         Time: [id number] PT Time Calculation (min) (ACUTE ONLY): 41 min   Charges:   PT Evaluation $Initial PT Evaluation Tier I: 1 Procedure PT Treatments $Gait Training: 8-22 mins   PT G CodesHaynes Bast 2014/12/17, 4:25 PM Haynes Bast, SPT 12-17-2014 4:25 PM

## 2014-11-26 NOTE — Progress Notes (Signed)
Report given to maryann shaver rn as caregiver 

## 2014-11-26 NOTE — H&P (Signed)
  The recent History & Physical has been reviewed. I have personally examined the patient today. There is no interval change to the documented History & Physical. The patient would like to proceed with the procedure.  Carolene Gitto, [place] 11/26/2014,  7:05 AM

## 2014-11-26 NOTE — Anesthesia Preprocedure Evaluation (Addendum)
Anesthesia Evaluation  Patient identified by MRN, date of birth, ID band Patient awake    Reviewed: Allergy & Precautions, NPO status , Patient's Chart, lab work & pertinent test results  Airway Mallampati: II  TM Distance: >3 FB Neck ROM: Full    Dental  (+) Dental Advisory Given   Pulmonary pneumonia (hx of it 15+ yrs ago), resolved, former smoker,    breath sounds clear to auscultation       Cardiovascular + Peripheral Vascular Disease   Rhythm:Regular Rate:Normal     Neuro/Psych negative neurological ROS     GI/Hepatic negative GI ROS, Neg liver ROS,   Endo/Other  Morbid obesity  Renal/GU Renal InsufficiencyRenal disease     Musculoskeletal  (+) Arthritis ,   Abdominal   Peds  Hematology negative hematology ROS (+)   Anesthesia Other Findings   Reproductive/Obstetrics                            Lab Results  Component Value Date   WBC 7.1 11/15/2014   HGB 14.4 11/15/2014   HCT 43.5 11/15/2014   MCV 88.6 11/15/2014   PLT 285 11/15/2014   Lab Results  Component Value Date   CREATININE 1.61* 11/15/2014   BUN 16 11/15/2014   NA 142 11/15/2014   K 3.8 11/15/2014   CL 108 11/15/2014   CO2 26 11/15/2014   Lab Results  Component Value Date   INR 1.13 11/15/2014   INR 1.03 08/13/2010    Anesthesia Physical Anesthesia Plan  ASA: II  Anesthesia Plan: MAC and Spinal   Post-op Pain Management:    Induction: Intravenous  Airway Management Planned: Natural Airway and Simple Face Mask  Additional Equipment:   Intra-op Plan:   Post-operative Plan:   Informed Consent: I have reviewed the patients History and Physical, chart, labs and discussed the procedure including the risks, benefits and alternatives for the proposed anesthesia with the patient or authorized representative who has indicated his/her understanding and acceptance.     Plan Discussed with:  CRNA  Anesthesia Plan Comments:         Anesthesia Quick Evaluation

## 2014-11-26 NOTE — Op Note (Signed)
PATIENT ID:      Christopher [name]  MRN:     [id number] DOB/AGE:    [birth year] / 81 y.o.       OPERATIVE REPORT    DATE OF PROCEDURE:  11/26/2014       PREOPERATIVE DIAGNOSIS:   RIGHT HIP OSTEOARTHRITIS-PRIMARY, END STAGE                                                       Estimated body mass index is 40.83 kg/(m^2) as calculated from the following:   Height as of 11/15/14: 5\' 8"  (1.727 m).   Weight as of this encounter: 121.791 kg (268 lb 8 oz).     POSTOPERATIVE DIAGNOSIS:   RIGHT HIP OSTEOARTHRITIS -SAME                                                                    Estimated body mass index is 40.83 kg/(m^2) as calculated from the following:   Height as of 11/15/14: 5\' 8"  (1.727 m).   Weight as of this encounter: 121.791 kg (268 lb 8 oz).     PROCEDURE:  Procedure(s):RIGHT TOTAL HIP ARTHROPLASTY     SURGEON:  Joni Fears, MD    ASSISTANT:   Biagio Borg, PA-C   (Present and scrubbed throughout the case, critical for assistance with exposure, retraction, instrumentation, and closure.)          ANESTHESIA: spinal and IV sedation     DRAINS: none :      TOURNIQUET TIME: * No tourniquets in log *    COMPLICATIONS:  None   CONDITION:  stable  PROCEDURE IN [id number]   Christopher [name] 11/26/2014, 9:40 AM

## 2014-11-27 ENCOUNTER — Encounter (HOSPITAL_COMMUNITY): Payer: Self-pay | Admitting: Orthopaedic Surgery

## 2014-11-27 LAB — CBC
HCT: 34.3 % — ABNORMAL LOW (ref 39.0–52.0)
HEMOGLOBIN: 11.2 g/dL — AB (ref 13.0–17.0)
MCH: 29.3 pg (ref 26.0–34.0)
MCHC: 32.7 g/dL (ref 30.0–36.0)
MCV: 89.8 fL (ref 78.0–100.0)
PLATELETS: 182 10*3/uL (ref 150–400)
RBC: 3.82 MIL/uL — ABNORMAL LOW (ref 4.22–5.81)
RDW: 12.9 % (ref 11.5–15.5)
WBC: 7.6 10*3/uL (ref 4.0–10.5)

## 2014-11-27 LAB — BASIC METABOLIC PANEL
Anion gap: 6 (ref 5–15)
BUN: 10 mg/dL (ref 6–20)
CALCIUM: 8.6 mg/dL — AB (ref 8.9–10.3)
CHLORIDE: 106 mmol/L (ref 101–111)
CO2: 27 mmol/L (ref 22–32)
CREATININE: 1.39 mg/dL — AB (ref 0.61–1.24)
GFR, EST AFRICAN AMERICAN: 52 mL/min — AB (ref >60)
GFR, EST NON AFRICAN AMERICAN: 45 mL/min — AB (ref >60)
Glucose, Bld: 127 mg/dL — ABNORMAL HIGH (ref 65–99)
Potassium: 3.6 mmol/L (ref 3.5–5.1)
SODIUM: 139 mmol/L (ref 135–145)

## 2014-11-27 NOTE — Clinical Social Work Placement (Signed)
   CLINICAL SOCIAL WORK PLACEMENT  NOTE  Date:  11/27/2014  Patient Details  Name: Christopher [name] MRN: [id number] Date of Birth: 1928/05/18  Clinical Social Work is seeking post-discharge placement for this patient at the [place] level of care (*CSW will initial, date and re-position this form in  chart as items are completed):  Yes   Patient/family provided with [facility]'s list of facilities offering this level of care within the geographic area requested by the patient (or if unable, by the patient's family).  Yes   Patient/family informed of their freedom to choose among providers that offer the needed level of care, that participate in Medicare, Medicaid or managed care program needed by the patient, have an available bed and are willing to accept the patient.  Yes   Patient/family informed of [facility]'s ownership interest in [facility] and [facility], as well as of the fact that they are under no obligation to receive care at these facilities.  PASRR submitted to EDS on       PASRR number received on       Existing PASRR number confirmed on 11/27/14     FL2 transmitted to all facilities in geographic area requested by pt/family on 11/27/14     FL2 transmitted to all facilities within larger geographic area on       Patient informed that his/her managed care company has contracts with or will negotiate with certain facilities, including the following:            Patient/family informed of bed offers received.  Patient chooses bed at       Physician recommends and patient chooses bed at      Patient to be transferred to   on  .  Patient to be transferred to facility by       Patient family notified on   of transfer.  Name of family member notified:        PHYSICIAN Please sign FL2     Additional Comment:    _______________________________________________ Caroline Sauger, LCSW 11/27/2014, 12:46  PM

## 2014-11-27 NOTE — Progress Notes (Signed)
Physical Therapy Treatment Patient Details Name: Christopher [name] MRN: [id number] DOB: August 03, 1928 Today's Date: 11/27/2014    History of Present Illness Pt admitted for R posterior THA with PMHx of prostate CA    PT Comments    Patient demonstrated ability to ascend/descend one step with good safety awareness min guard. Limited by increased pain level this pm. Continue to progress patient as tolerated next tx with anticipation of d/c to SNF.  Follow Up Recommendations  SNF     Equipment Recommendations  None recommended by PT    Recommendations for Other Services       Precautions / Restrictions Precautions Precautions: Posterior Hip;Fall Restrictions Weight Bearing Restrictions: Yes RLE Weight Bearing: Weight bearing as tolerated    Mobility  Bed Mobility Overal bed mobility: Needs Assistance Bed Mobility: Supine to Sit     Supine to sit: HOB elevated;Min assist Sit to supine: Min assist   General bed mobility comments: Pt needed assistance with abducting/adducting the RLE; verbal cues for sequencing   Transfers Overall transfer level: Needs assistance Equipment used: Rolling walker (2 wheeled) Transfers: Sit to/from Stand Sit to Stand: Min assist         General transfer comment: max vc for hand placement and sequencing and maintaining precautions  Ambulation/Gait Ambulation/Gait assistance: Min guard Ambulation Distance (Feet): 25 Feet (in room) Assistive device: Rolling walker (2 wheeled) Gait Pattern/deviations: Step-to pattern;Decreased step length - right;Antalgic Gait velocity: decreased   General Gait Details: vc for increased stride lenght, maintaining upright posture, and righting eyes   Stairs Stairs: Yes Stairs assistance: Min guard Stair Management: With walker Number of Stairs: 1    Wheelchair Mobility    Modified Rankin (Stroke Patients Only)       Balance Overall balance assessment: Needs assistance Sitting-balance support:  No upper extremity supported Sitting balance-Leahy Scale: Good     Standing balance support: Bilateral upper extremity supported Standing balance-Leahy Scale: Poor                      Cognition Arousal/Alertness: Awake/alert Behavior During Therapy: WFL for tasks assessed/performed Overall Cognitive Status: Within Functional Limits for tasks assessed                      Exercises Total Joint Exercises Ankle Circles/Pumps: AROM;Strengthening;Right;10 reps Quad Sets: AROM;Strengthening;Right;10 reps Short Arc Quad: AROM;Strengthening;Right;10 reps Heel Slides: AROM;Strengthening;Right;5 reps Hip ABduction/ADduction: AROM;Strengthening;Right;10 reps    General Comments General comments (skin integrity, edema, etc.): able to recall 2/3 precautions      Pertinent Vitals/Pain Pain Assessment: 0-10 Pain Score: 8  Pain Location: right hip Pain Descriptors / Indicators: Sore Pain Intervention(s): Limited activity within patient's tolerance;Repositioned    Home Living Family/patient expects to be discharged to:: Private residence Living Arrangements: Spouse/significant other Available Help at Discharge: Family;Available 24 hours/day Type of Home: House Home Access: Stairs to enter   Home Layout: Two level;Able to live on main level with bedroom/bathroom Home Equipment: Gilford Rile - 2 wheels;Cane - single point;Grab bars - toilet;Grab bars - tub/shower;Shower seat Additional Comments: one step down to the living room on the main level    Prior Function Level of Independence: Independent          PT Goals (current goals can now be found in the care plan section) Acute Rehab PT Goals Patient Stated Goal: Pt wishes to get back home to wife PT Goal Formulation: With patient Time For Goal Achievement: 12/03/14 Potential to Achieve Goals: Good Progress  towards PT goals: Progressing toward goals    Frequency  7X/week    PT Plan      Co-evaluation              End of Session Equipment Utilized During Treatment: Gait belt;Right knee immobilizer Activity Tolerance: Patient tolerated treatment well Patient left: in chair;with call bell/phone within reach;with family/visitor present     Time: [id number] PT Time Calculation (min) (ACUTE ONLY): 26 min  Charges:  $Gait Training: 8-22 mins $Therapeutic Exercise: 8-22 mins $Therapeutic Activity: 8-22 mins                    G CodesDarliss Cheney, PTA [phone] 11/27/2014, 4:56 PM

## 2014-11-27 NOTE — Evaluation (Signed)
Occupational Therapy Evaluation Patient Details Name: Christopher [name] MRN: [id number] DOB: 1928-08-27 Today's Date: 11/27/2014    History of Present Illness Pt admitted for R posterior THA with PMHx of prostate CA   Clinical Impression   Pt currently needs min assist for toilet tranfers and toileting as well as mod to max assist for LB selfcare adhering to THR precautions.  Will benefit from acute care OT to further progress independence with further recommendation for SNF level therapy to reach modified independent status.  Will continue to follow in acute care.     Follow Up Recommendations  SNF;Supervision/Assistance - 24 hour    Equipment Recommendations  None recommended by OT;Other (comment) (equipment to be determined next venue of care)       Precautions / Restrictions Precautions Precautions: Posterior Hip;Fall Restrictions Weight Bearing Restrictions: No RLE Weight Bearing: Weight bearing as tolerated      Mobility Bed Mobility Overal bed mobility: Needs Assistance Bed Mobility: Sit to Supine     Supine to sit: Mod assist;HOB elevated Sit to supine: Min assist   General bed mobility comments: Pt needed assistance with lifting the RLE back into the bed.   Transfers Overall transfer level: Needs assistance Equipment used: Rolling walker (2 wheeled) Transfers: Sit to/from Stand Sit to Stand: Min assist         General transfer comment: max vc for hand placement and sequencing and maintaining precautions    Balance Overall balance assessment: Needs assistance Sitting-balance support: No upper extremity supported Sitting balance-Leahy Scale: Good     Standing balance support: Bilateral upper extremity supported Standing balance-Leahy Scale: Poor                              ADL Overall ADL's : Needs assistance/impaired Eating/Feeding: Independent;Sitting   Grooming: Wash/dry hands;Wash/dry face;Min guard;Standing   Upper Body Bathing:  Supervision/ safety;Sitting   Lower Body Bathing: Moderate assistance;Sit to/from stand   Upper Body Dressing : Supervision/safety;Sitting   Lower Body Dressing: Sit to/from stand;Maximal assistance   Toilet Transfer: Minimal assistance;Ambulation;RW;Comfort height toilet;Grab bars   Toileting- Clothing Manipulation and Hygiene: Minimal assistance;Sit to/from stand       Functional mobility during ADLs: Minimal assistance;Cueing for safety;Rolling walker General ADL Comments: Pt able to state 2/3 hip precautions.  Sitting on elevated toilet when therapist entered room.  He urinated on the floor, so therapist assisted with clean-up.  Pt stood at the sink after standing from the toilet with min assist and completed grooming tasks.  Requested to get back in bed since he had been sitting up in the chair since approximately 12:00.  Educated pt and family on AE availability in order to follow THR precautions.     Vision Vision Assessment?: No apparent visual deficits   Perception Perception Perception Tested?: No   Praxis Praxis Praxis tested?: Not tested    Pertinent Vitals/Pain Pain Assessment: 0-10 Pain Score: 2  Pain Location: right hip Pain Descriptors / Indicators: Discomfort Pain Intervention(s): Repositioned;Premedicated before session        Extremity/Trunk Assessment Upper Extremity Assessment Upper Extremity Assessment: Overall WFL for tasks assessed   Lower Extremity Assessment Lower Extremity Assessment: Defer to PT evaluation   Cervical / Trunk Assessment Cervical / Trunk Assessment: Kyphotic      Cognition Arousal/Alertness: Awake/alert Behavior During Therapy: WFL for tasks assessed/performed Overall Cognitive Status: Within Functional Limits for tasks assessed  Home Living Family/patient expects to be discharged to:: Private residence Living Arrangements: Spouse/significant other Available Help at Discharge:  Family;Available 24 hours/day Type of Home: House Home Access: Stairs to enter [employer] of Steps: 2   Home Layout: Two level;Able to live on main level with bedroom/bathroom     Bathroom Shower/Tub: Walk-in shower   Bathroom Toilet: Handicapped height     Home Equipment: Environmental consultant - 2 wheels;Cane - single point;Grab bars - toilet;Grab bars - tub/shower;Shower seat   Additional Comments: one step down to the living room on the main level      Prior Functioning/Environment Level of Independence: Independent             OT Diagnosis: Generalized weakness;Acute pain   OT Problem List: Decreased strength;Impaired balance (sitting and/or standing);Decreased knowledge of use of DME or AE;Decreased knowledge of precautions;Pain   OT Treatment/Interventions: Self-care/ADL training;Patient/family education;Balance training;Therapeutic activities;DME and/or AE instruction    OT Goals(Current goals can be found in the care plan section) Acute Rehab OT Goals Patient Stated Goal: Pt did not state but requested to transfer back to bed. OT Goal Formulation: With patient/family Time For Goal Achievement: 12/04/14 Potential to Achieve Goals: Good  OT Frequency: Min 2X/week              End of Session Equipment Utilized During Treatment: Rolling walker;Right knee immobilizer Nurse Communication: Mobility status;Other (comment) (Pt with successful urination )  Activity Tolerance: Patient tolerated treatment well Patient left: in bed;with call bell/phone within reach;with family/visitor present   Time: 1445-1511 OT Time Calculation (min): 26 min Charges:  OT General Charges $OT Visit: 1 Procedure OT Evaluation $Initial OT Evaluation Tier I: 1 Procedure OT Treatments $Self Care/Home Management : 8-22 mins  Liset Mcmonigle OTR/L 11/27/2014, 3:25 PM

## 2014-11-27 NOTE — NC FL2 (Signed)
[place] LEVEL OF CARE SCREENING TOOL     IDENTIFICATION  Patient Name: Christopher [name] Birthdate: September 08, 1928 Sex: male Admission Date (Current Location): 11/26/2014  [facility] and [place] Number:     Facility and Address:  [facility], [place] [address] [place], [place] [address]      Provider Number: [id number]  Attending Physician Name and Address:  Garald Balding, MD  Relative Name and Phone Number:  Saed Wigginton, [phone]    Current Level of Care: Hospital Recommended Level of Care: [place] Prior Approval Number:    Date Approved/Denied:   PASRR Number: [id number]  Discharge Plan: SNF    Current Diagnoses: Patient Active Problem List   Diagnosis Date Noted  . Primary osteoarthritis of right hip 11/26/2014  . Severe obesity (BMI >= 40) ([place]) 11/26/2014  . S/P total hip arthroplasty 11/26/2014  . Varicose veins of lower extremities with other complications [id number]    Orientation ACTIVITIES/SOCIAL BLADDER RESPIRATION    Self, Time, Situation, Place  Active, Family supportive Continent Normal  BEHAVIORAL SYMPTOMS/MOOD NEUROLOGICAL BOWEL NUTRITION STATUS  Other (Comment) (n/a)  (n/a) Continent Diet (Currently clear liquid, please see discharge summary for any changes.)  PHYSICIAN VISITS COMMUNICATION OF NEEDS Height & Weight Skin    Verbally 5\' 8"  (172.7 cm) 268 lbs. Surgical wounds (R hip)          AMBULATORY STATUS RESPIRATION    Supervision limited Normal      Personal Care Assistance Level of Assistance  Bathing, Feeding, Dressing Bathing Assistance: Limited assistance Feeding assistance: Limited assistance Dressing Assistance: Limited assistance      Functional Limitations Info   (n/a)             [place]  PT (By licensed PT), OT (By licensed OT)     PT Frequency: 5 OT Frequency: 5           Additional Factors Info  Code Status, Allergies Code  Status Info: FULL Code Allergies Info: Chicken Allergy, Penicillins           Current Medications (11/27/2014): Current Facility-Administered Medications  Medication Dose Route Frequency Provider Last Rate Last Dose  . alum & mag hydroxide-simeth (MAALOX/MYLANTA) 200-200-20 MG/5ML suspension 30 mL  30 mL Oral Q4H PRN Mike Craze [name], PA-C      . bisacodyl (DULCOLAX) suppository 10 mg  10 mg Rectal Daily PRN Cherylann Ratel, PA-C      . clotrimazole-betamethasone (LOTRISONE) cream   Topical BID PRN Cherylann Ratel, PA-C      . diphenhydrAMINE (BENADRYL) 12.5 MG/5ML elixir 12.5-25 mg  12.5-25 mg Oral Q4H PRN Mike Craze [name], PA-C      . docusate sodium (COLACE) capsule 100 mg  100 mg Oral BID Mike Craze [name], PA-C   100 mg at 11/27/14 1055  . HYDROmorphone (DILAUDID) injection 0.5-1 mg  0.5-1 mg Intravenous Q2H PRN Mike Craze [name], PA-C      . magnesium citrate solution 1 Bottle  1 Bottle Oral Once PRN Cherylann Ratel, PA-C      . menthol-cetylpyridinium (CEPACOL) lozenge 3 mg  1 lozenge Oral PRN Cherylann Ratel, PA-C       Or  . phenol (CHLORASEPTIC) mouth spray 1 spray  1 spray Mouth/Throat PRN Cherylann Ratel, PA-C      . methocarbamol (ROBAXIN) tablet 500 mg  500 mg Oral Q6H PRN Cherylann Ratel, PA-C   500 mg at 11/26/14 2203  Or  . methocarbamol (ROBAXIN) 500 mg in dextrose 5 % 50 mL IVPB  500 mg Intravenous Q6H PRN Cherylann Ratel, PA-C      . metoCLOPramide (REGLAN) tablet 5-10 mg  5-10 mg Oral Q8H PRN Cherylann Ratel, PA-C       Or  . metoCLOPramide (REGLAN) injection 5-10 mg  5-10 mg Intravenous Q8H PRN Cherylann Ratel, PA-C      . ondansetron (ZOFRAN) tablet 4 mg  4 mg Oral Q6H PRN Cherylann Ratel, PA-C       Or  . ondansetron (ZOFRAN) injection 4 mg  4 mg Intravenous Q6H PRN Cherylann Ratel, PA-C      . oxyCODONE (Oxy IR/ROXICODONE) immediate release tablet 5-10 mg  5-10 mg Oral Q3H PRN Cherylann Ratel, PA-C   10 mg at 11/27/14 1110  . polyethylene glycol  (MIRALAX / GLYCOLAX) packet 17 g  17 g Oral Daily PRN Cherylann Ratel, PA-C      . rivaroxaban (XARELTO) tablet 10 mg  10 mg Oral Q breakfast Cherylann Ratel, PA-C   10 mg at 11/27/14 0840  . triamterene-hydrochlorothiazide (MAXZIDE) 75-50 MG per tablet 1 tablet  1 tablet Oral Daily Cherylann Ratel, PA-C   1 tablet at 11/27/14 1113   Do not use this list as official medication orders. Please verify with discharge summary.  Discharge Medications:   Medication List    ASK your doctor about these medications        ciprofloxacin 500 MG tablet  Commonly known as:  CIPRO  Take 500 mg by mouth 2 (two) times daily. For 20 days     clotrimazole-betamethasone cream  Commonly known as:  LOTRISONE  Apply topically 2 (two) times daily as needed (itching).     docusate sodium 50 MG capsule  Commonly known as:  COLACE  Take by mouth daily as needed for mild constipation.     naproxen sodium 220 MG tablet  Commonly known as:  ANAPROX  Take 220 mg by mouth daily as needed (pain).     OVER THE COUNTER MEDICATION  Take 15 mLs by mouth daily. Swedish Bitter     traMADol 50 MG tablet  Commonly known as:  ULTRAM  Take 50 mg by mouth every 6 (six) hours as needed for moderate pain.     triamterene-hydrochlorothiazide 75-50 MG tablet  Commonly known as:  MAXZIDE  Take 1 tablet by mouth daily.        Relevant Imaging Results:  Relevant Lab Results:  Recent Labs    Additional Information Social Security #: [id number]  Luna Kitchens [phone]

## 2014-11-27 NOTE — Progress Notes (Signed)
Patient ID: Christopher [name], male   DOB: 1928/06/29, 81 y.o.   MRN: [id number] PATIENT ID: Christopher [name]        MRN:  [id number]          DOB/AGE: 05-19-1928 / 81 y.o.    Joni Fears, MD   Biagio Borg, PA-C [address] [place], [place]  [address]                             [phone]   PROGRESS NOTE  Subjective:  negative for Chest Pain  negative for Shortness of Breath  negative for Nausea/Vomiting   positive for Calf Pain    Tolerating Diet: yes         Patient reports pain as mild.     States pain is so much better  Objective: Vital signs in last 24 hours:   Patient Vitals for the past 24 hrs:  BP Temp Temp src Pulse Resp SpO2  11/27/14 0527 (!) 118/55 mmHg 98.9 F (37.2 C) Oral 79 18 96 %  11/27/14 0239 (!) 119/56 mmHg 98.7 F (37.1 C) Oral 77 18 95 %  11/26/14 2000 127/65 mmHg 97.6 F (36.4 C) Oral 79 17 90 %  11/26/14 1300 123/62 mmHg 97.3 F (36.3 C) Oral 66 18 98 %  11/26/14 1100 - 97.5 F (36.4 C) - 61 16 100 %  11/26/14 1056 127/79 mmHg - - - - -  11/26/14 1045 - - - 67 19 98 %  11/26/14 1033 115/63 mmHg - - - - -  11/26/14 1030 - - - 60 18 100 %  11/26/14 1018 107/64 mmHg - - - - -  11/26/14 1015 107/64 mmHg - - (!) 57 12 100 %  11/26/14 1000 (!) 98/54 mmHg 97.8 F (36.6 C) - 66 14 94 %      Intake/Output from previous day:   11/15 0701 - 11/16 0700 In: 3110 [P.O.:410; I.V.:2350] Out: 2040 [Urine:1590]   Intake/Output this shift:       Intake/Output      11/15 0701 - 11/16 0700 11/16 0701 - 11/17 0700   P.O. 410    I.V. (mL/kg) 2350 (19.3)    IV Piggyback 350    Total Intake(mL/kg) 3110 (25.5)    Urine (mL/kg/hr) 1590 (0.5)    Blood 450 (0.2)    Total Output 2040     Net +1070             LABORATORY DATA:  Recent Labs  11/27/14 0515  WBC 7.6  HGB 11.2*  HCT 34.3*  PLT 182    Recent Labs  11/27/14 0515  NA 139  K 3.6  CL 106  CO2 27  BUN 10  CREATININE 1.39*  GLUCOSE 127*  CALCIUM 8.6*   Lab Results    Component Value Date   INR 1.13 11/15/2014   INR 1.03 08/13/2010    Recent Radiographic Studies :  Dg Chest 2 View  11/15/2014  CLINICAL DATA:  Preoperative exam prior to total hip arthroplasty, asymptomatic. EXAM: CHEST  2 VIEW COMPARISON:  PA and lateral chest x-ray dated June 19, 2012 FINDINGS: The lungs are adequately inflated. The interstitial markings are coarse. There is no alveolar infiltrate or pleural effusion. The heart is top-normal in size. The pulmonary vascularity is normal. The mediastinum is normal in width. There is stable tortuosity of the descending thoracic aorta with calcification in the aortic arch. There is mild multilevel  degenerative disc disease of the thoracic and upper lumbar spine. IMPRESSION: Stable chronic bronchitic changes. There is no evidence of CHF nor pneumonia. Electronically Signed   By: David  Martinique M.D.   On: 11/15/2014 10:12   Dg Hip Port Unilat With Pelvis 1v Right  11/26/2014  CLINICAL DATA:  Status post right hip replacement EXAM: DG HIP (WITH OR WITHOUT PELVIS) 1V PORT RIGHT COMPARISON:  None. FINDINGS: A right hip prosthesis is seen. No acute bony abnormality is noted. Prostate brachytherapy seeds are noted. The visualized pelvic ring is intact. IMPRESSION: Status post right hip replacement Electronically Signed   By: Inez Catalina M.D.   On: 11/26/2014 12:18     Examination:  General appearance: alert, cooperative and mild distress Resp: clear to auscultation bilaterally Cardio: regular rate and rhythm GI: normal findings: bowel sounds normal  Wound Exam: clean, dry, intact dressing  Drainage:  None:   Motor Exam: EHL, FHL, Anterior Tibial and Posterior Tibial Intact  Sensory Exam: Superficial Peroneal, Deep Peroneal and Tibial normal  Vascular Exam: Right posterior tibial artery has trace pulse  Assessment:    1 Day Post-Op  Procedure(s) (LRB): TOTAL HIP ARTHROPLASTY (Right)  ADDITIONAL DIAGNOSIS:  Principal Problem:   Primary  osteoarthritis of right hip Active Problems:   Severe obesity (BMI >= 40) (HCC)   S/P total hip arthroplasty     Plan: Physical Therapy as ordered Weight Bearing as Tolerated (WBAT)  DVT Prophylaxis:  Xarelto, Foot Pumps and TED hose  DISCHARGE PLAN: Skilled Nursing Facility/Rehab [facility]  DISCHARGE NEEDS: HHPT, Walker and 3-in-1 comode seat        [name],[name]  11/27/2014 8:05 AM

## 2014-11-27 NOTE — Clinical Social Work Note (Signed)
Clinical Social Work Assessment  Patient Details  Name: Christopher [name] MRN: [id number] Date of Birth: 10/07/1928  Date of referral:  11/27/14               Reason for consult:  Facility Placement, Discharge Planning                Permission sought to share information with:  Facility Sport and exercise psychologist, Family Supports Permission granted to share information::  Yes, Verbal Permission Granted  Name::     [place]::  [employer] (preference [facility])  Relationship::  Spouse  Contact Information:  [phone]  Housing/Transportation Living arrangements for the past 2 months:  [place] of Information:  Patient Patient Interpreter Needed:  None Criminal Activity/Legal Involvement Pertinent to Current Situation/Hospitalization:  No - Comment as needed Significant Relationships:  Adult Children, Spouse Lives with:  Spouse Do you feel safe going back to the place where you live?  No (High fall risk.) Need for family participation in patient care:  Yes (Comment) (Patient's daughter and wife active in patient's care.)  Care giving concerns:  Patient nor patient's family expressed any concern at this time.   Social Worker assessment / plan:  CSW received referral for possible SNF placement at time of discharge. CSW met with patient, patient's wife, and patient's daughter at bedside to discuss discharge planning needs. Per patient, patient has pre-registered with [facility] for short-term rehabilitation post-hospital stay. CSW to follow up with [facility] regarding patient's discharge plan.  Patient's daughter approached CSW outside of patient's room requesting information regarding patient's wife also being placed at [name] while patient completely short-term rehabilitation. Per patient's daughter, patient's daughter from out of town and unable to stay with patient's wife for duration of patient's stay at [facility]. CSW advised patient's  daughter to speak with patient's wife Primary Care Physician and [facility]. Patient's daughter expressed understanding and was provided [facility] contact information.  Employment status:  Retired Forensic scientist:  Medicare PT Recommendations:  [place] / Referral to community resources:  [place]  Patient/Family's Response to care:  Patient and patient's family understanding and agreeable to CSW plan of care.  Patient/Family's Understanding of and Emotional Response to Diagnosis, Current Treatment, and Prognosis:  Patient and patient's family understanding and agreeable to CSW plan of care.  Emotional Assessment Appearance:  Appears stated age Attitude/Demeanor/Rapport:  Other (Pleasant.) Affect (typically observed):  Accepting, Appropriate, Pleasant, Calm, Happy, Hopeful Orientation:  Oriented to Self, Oriented to Place, Oriented to  Time, Oriented to Situation Alcohol / Substance use:  Not Applicable Psych involvement (Current and /or in the community):  No (Comment) (Not appropriate on this admission.)  Discharge Needs  Concerns to be addressed:  No discharge needs identified Readmission within the last 30 days:  No Current discharge risk:  None Barriers to Discharge:  No Barriers Identified   Christopher Sauger, LCSW 11/27/2014, 12:39 PM [phone]

## 2014-11-27 NOTE — Progress Notes (Signed)
Physical Therapy Treatment Patient Details Name: Christopher [name] MRN: [id number] DOB: 06/20/1928 Today's Date: 11/27/2014    History of Present Illness Pt admitted for R posterior THA with PMHx of prostate CA    PT Comments    Progressing toward PT goals with ambulation of 187ft but will benefit from more skilled PT services for increased independence. Overall mobility level min/mod A with demonstrated decreased safety awareness. Continue to progress patient as tolerated with anticipation of d/c to SNF.   Follow Up Recommendations  SNF     Equipment Recommendations  None recommended by PT    Recommendations for Other Services       Precautions / Restrictions Precautions Precautions: Posterior Hip;Fall Restrictions Weight Bearing Restrictions: No RLE Weight Bearing: Weight bearing as tolerated    Mobility  Bed Mobility Overal bed mobility: Needs Assistance Bed Mobility: Supine to Sit     Supine to sit: Mod assist;HOB elevated     General bed mobility comments: assist with pivoting and blocking of R LE, assisted with trunk elevation, cues for sequence and precautions  Transfers Overall transfer level: Needs assistance Equipment used: Rolling walker (2 wheeled) Transfers: Sit to/from Stand Sit to Stand: Min assist         General transfer comment: max vc for hand placement and sequencing and maintaining precautions  Ambulation/Gait Ambulation/Gait assistance: Min guard Ambulation Distance (Feet): 100 Feet Assistive device: Rolling walker (2 wheeled) Gait Pattern/deviations: Step-to pattern;Decreased step length - right;Antalgic Gait velocity: decreased   General Gait Details: vc for increased stride lenght, maintaining upright posture, and righting eyes   Stairs            Wheelchair Mobility    Modified Rankin (Stroke Patients Only)       Balance Overall balance assessment: Needs assistance Sitting-balance support: No upper extremity  supported Sitting balance-Leahy Scale: Good     Standing balance support: Bilateral upper extremity supported Standing balance-Leahy Scale: Poor                      Cognition Arousal/Alertness: Awake/alert Behavior During Therapy: WFL for tasks assessed/performed Overall Cognitive Status: Within Functional Limits for tasks assessed                      Exercises      General Comments General comments (skin integrity, edema, etc.): able to recall 2/3 precautions      Pertinent Vitals/Pain Pain Assessment: 0-10 Pain Score: 2  Pain Location: right hip Pain Descriptors / Indicators: Discomfort Pain Intervention(s): Repositioned;Premedicated before session    Home Living                      Prior Function            PT Goals (current goals can now be found in the care plan section) Acute Rehab PT Goals Patient Stated Goal: Pt wishes to get back home to wife PT Goal Formulation: With patient Time For Goal Achievement: 12/03/14 Potential to Achieve Goals: Good Progress towards PT goals: Progressing toward goals    Frequency  7X/week    PT Plan      Co-evaluation             End of Session Equipment Utilized During Treatment: Gait belt;Right knee immobilizer Activity Tolerance: Patient tolerated treatment well Patient left: in chair;with call bell/phone within reach;with family/visitor present     Time: [id number] PT Time Calculation (min) (ACUTE ONLY): 29 min  Charges:  $Gait Training: 8-22 mins $Therapeutic Activity: 8-22 mins                    G CodesEarney Navy, PTA [phone] 11/27/2014, 3:19 PM

## 2014-11-28 ENCOUNTER — Inpatient Hospital Stay (HOSPITAL_COMMUNITY): Payer: Medicare Other

## 2014-11-28 DIAGNOSIS — M79661 Pain in right lower leg: Secondary | ICD-10-CM

## 2014-11-28 LAB — BASIC METABOLIC PANEL
Anion gap: 8 (ref 5–15)
BUN: 9 mg/dL (ref 6–20)
CHLORIDE: 101 mmol/L (ref 101–111)
CO2: 25 mmol/L (ref 22–32)
CREATININE: 1.25 mg/dL — AB (ref 0.61–1.24)
Calcium: 8.9 mg/dL (ref 8.9–10.3)
GFR calc Af Amer: 59 mL/min — ABNORMAL LOW (ref >60)
GFR calc non Af Amer: 51 mL/min — ABNORMAL LOW (ref >60)
GLUCOSE: 98 mg/dL (ref 65–99)
POTASSIUM: 3.3 mmol/L — AB (ref 3.5–5.1)
SODIUM: 134 mmol/L — AB (ref 135–145)

## 2014-11-28 LAB — CBC
HEMATOCRIT: 34.9 % — AB (ref 39.0–52.0)
HEMOGLOBIN: 11.4 g/dL — AB (ref 13.0–17.0)
MCH: 29 pg (ref 26.0–34.0)
MCHC: 32.7 g/dL (ref 30.0–36.0)
MCV: 88.8 fL (ref 78.0–100.0)
Platelets: 183 10*3/uL (ref 150–400)
RBC: 3.93 MIL/uL — ABNORMAL LOW (ref 4.22–5.81)
RDW: 12.6 % (ref 11.5–15.5)
WBC: 9.7 10*3/uL (ref 4.0–10.5)

## 2014-11-28 MED ORDER — OXYCODONE HCL 5 MG PO TABS: 5.0000 mg | ORAL_TABLET | ORAL | Status: DC | PRN

## 2014-11-28 MED ORDER — RIVAROXABAN 10 MG PO TABS: 10.0000 mg | ORAL_TABLET | Freq: Every day | ORAL | Status: DC

## 2014-11-28 MED ORDER — METHOCARBAMOL 500 MG PO TABS: 500.0000 mg | ORAL_TABLET | Freq: Three times a day (TID) | ORAL | Status: DC | PRN

## 2014-11-28 NOTE — Progress Notes (Signed)
VASCULAR LAB PRELIMINARY  PRELIMINARY  PRELIMINARY  PRELIMINARY  Right lower extremity venous duplex completed.    Preliminary report:  There is no DVT or SVT noted in the right lower extremity. Interstitial fluid noted in right calf.   Delphin Funes, RVT 11/28/2014, 1:58 PM

## 2014-11-28 NOTE — Clinical Social Work Note (Signed)
CSW met with patient at bedside to confirm patient has bed available at [facility] once medically stable for discharge. CSW to continue to follow and assist with discharge planning needs.  Lubertha Sayres, [facility]: [phone] Surgical: [phone]

## 2014-11-28 NOTE — Progress Notes (Signed)
PT Cancellation Note  Patient Details Name: Bill Wied MRN: [id number] DOB: 17-Jul-1928   Cancelled Treatment:    Reason Eval/Treat Not Completed: Other (comment) (RN requested PT return after scheduled DVT screening this am)   Darliss Cheney, PTA [phone] 11/28/2014, 11:26 AM

## 2014-11-28 NOTE — Care Management Note (Signed)
Case Management Note  Patient Details  Name: Christopher [name] MRN: [id number] Date of Birth: 1928/06/24  Subjective/Objective:          S/p right total hip arthroplasty          Action/Plan: PT recommended SNF, referral made to CSW, CSW arranged placement at [facility] Anticipate discharge 11/29/14.  Expected Discharge Date:                  Expected Discharge Plan:  [place]  In-House Referral:     Discharge planning Services     Post Acute Care Choice:    Choice offered to:     DME Arranged:    DME Agency:     HH Arranged:    [place] Agency:     Status of Service:  Completed, signed off  Medicare Important Message Given:    Date Medicare IM Given:    Medicare IM give by:    Date Additional Medicare IM Given:    Additional Medicare Important Message give by:     If discussed at [place] of Stay Meetings, dates discussed:    Additional Comments:  Nila Nephew, RN 11/28/2014, 11:16 AM

## 2014-11-28 NOTE — Discharge Summary (Signed)
Joni Fears, MD   Biagio Borg, PA-C [address], [place], [place]  [address]                             [phone]  PATIENT ID: Christopher [name]        MRN:  [id number]          DOB/AGE: 1928/08/09 / 81 y.o.    DISCHARGE SUMMARY  ADMISSION DATE:    11/26/2014 DISCHARGE DATE:   11/29/2014   ADMISSION DIAGNOSIS: RIGHT HIP OSTEOARTHRITIS    DISCHARGE DIAGNOSIS:  RIGHT HIP OSTEOARTHRITIS    ADDITIONAL DIAGNOSIS: Principal Problem:   Primary osteoarthritis of right hip Active Problems:   Severe obesity (BMI >= 40) (HCC)   S/P total hip arthroplasty  Past Medical History  Diagnosis Date  . Chronic kidney disease   . Cancer [facility]) 2005    prostate cancer treated with external beam radiation and radioactive seeds  . DJD (degenerative joint disease)     thumbs and knees  . Constipation     takes Colace daily as needed  . Peripheral edema     takes Maxzide daily  . Pneumonia     hx of-15+yrs ago  . Leg cramps     no meds  . Decreased hearing   . Joint pain   . Joint swelling   . Ringing in ears   . History of gout   . Bruises easily     both hands  . Diverticulitis 2003  . History of colon polyps     benign  . UTI (lower urinary tract infection)     just completed antibiotic on 11/14/14  . Cataracts, bilateral     immature    PROCEDURE: Procedure(s): TOTAL HIP ARTHROPLASTY Right on 11/26/2014  CONSULTS: none     HISTORY: Christopher [name] is a very pleasant, 81 year old, white male who is seen today for evaluation of his right hip and groin pain. He was initially seen back in February 2016, and his x-rays at that time revealed bone-on-bone osteoarthritis of the right hip; however, his medical doctor felt as if he developed shingles, and that was the reason he was having pain in his buttock. He never really had a rash. He was placed on medications for the shingles. He was seen then on March 05, 2014, that of about 4 weeks from apparent shingles and was  noted then to have the x-rays revealing the osteoarthritis. He began to continue to have problems with his right hip and groin, and it was felt that a possible corticosteroid injection would be helpful, and that occurred on August 14, 2014. He returned and states that the injection was helpful for a day or two, but then the pain returned. He did have skin eruptions and blisters in the right paralumbar region, and those had all resolved. He has continued to have pain and discomfort. He is quite a risk, and finally though he got to the point that despite all conservative treatment, his pain was worsening. He would like to proceed with a right total hip arthroplasty. He states that he has pain with every step as well as night time pain. He has difficulty with his activities of daily living. For ambulation, he has difficulties. He has had corticosteroid injections and rest but still continues to have significant pain and discomfort  HOSPITAL COURSE:  Christopher [name] is a 81 y.o. admitted on 11/26/2014 and found to have a  diagnosis of RIGHT HIP OSTEOARTHRITIS.  After appropriate laboratory studies were obtained  they were taken to the operating room on 11/26/2014 and underwent  Procedure(s): TOTAL HIP ARTHROPLASTY  Right.   They were given perioperative antibiotics:  Anti-infectives    Start     Dose/Rate Route Frequency Ordered Stop   11/27/14 0200  vancomycin (VANCOCIN) IVPB 1000 mg/200 mL premix     1,000 mg 200 mL/hr over 60 Minutes Intravenous Every 12 hours 11/26/14 1115 11/27/14 0511   11/26/14 0600  vancomycin (VANCOCIN) 1,500 mg in sodium chloride 0.9 % 500 mL IVPB     1,500 mg 250 mL/hr over 120 Minutes Intravenous On call to O.R. 11/25/14 1339 11/26/14 0855    .  Tolerated the procedure well.  Placed with a foley intraoperatively.     Toradol was given post op.  POD #1, allowed out of bed to a chair.  PT for ambulation and exercise program.  Foley D/C'd in morning.  IV saline locked.   O2 discontionued. Doppler of right calf negative  POD #2, continued PT and ambulation.  Awaiting for SNF discharge on day#3 . POD #3, continued PT doing well, plan D/C to [facility]  The remainder of the hospital course was dedicated to ambulation and strengthening.   The patient was discharged on 3 Days Post-Op in  Stable condition.  Blood products given:none  DIAGNOSTIC STUDIES: Recent vital signs:  Patient Vitals for the past 24 hrs:  BP Temp Temp src Pulse Resp SpO2  11/29/14 0605 126/70 mmHg 99.3 F (37.4 C) Oral 88 18 92 %  11/28/14 2045 124/68 mmHg 99.3 F (37.4 C) Oral 94 18 94 %  11/28/14 1231 (!) 125/55 mmHg - - 89 20 93 %       Recent laboratory studies:  Recent Labs  11/27/14 0515 11/28/14 0625 11/29/14 0425  WBC 7.6 9.7 9.7  HGB 11.2* 11.4* 11.6*  HCT 34.3* 34.9* 35.5*  PLT 182 183 188    Recent Labs  11/27/14 0515 11/28/14 0625 11/29/14 0425  NA 139 134* 134*  K 3.6 3.3* 3.9  CL 106 101 99*  CO2 27 25 25   BUN 10 9 14   CREATININE 1.39* 1.25* 1.46*  GLUCOSE 127* 98 88  CALCIUM 8.6* 8.9 9.0   Lab Results  Component Value Date   INR 1.13 11/15/2014   INR 1.03 08/13/2010     Recent Radiographic Studies :  Dg Chest 2 View  11/15/2014  CLINICAL DATA:  Preoperative exam prior to total hip arthroplasty, asymptomatic. EXAM: CHEST  2 VIEW COMPARISON:  PA and lateral chest x-ray dated June 19, 2012 FINDINGS: The lungs are adequately inflated. The interstitial markings are coarse. There is no alveolar infiltrate or pleural effusion. The heart is top-normal in size. The pulmonary vascularity is normal. The mediastinum is normal in width. There is stable tortuosity of the descending thoracic aorta with calcification in the aortic arch. There is mild multilevel degenerative disc disease of the thoracic and upper lumbar spine. IMPRESSION: Stable chronic bronchitic changes. There is no evidence of CHF nor pneumonia. Electronically Signed   By: David  Martinique M.D.    On: 11/15/2014 10:12   Dg Hip Port Unilat With Pelvis 1v Right  11/26/2014  CLINICAL DATA:  Status post right hip replacement EXAM: DG HIP (WITH OR WITHOUT PELVIS) 1V PORT RIGHT COMPARISON:  None. FINDINGS: A right hip prosthesis is seen. No acute bony abnormality is noted. Prostate brachytherapy seeds are noted. The visualized pelvic ring  is intact. IMPRESSION: Status post right hip replacement Electronically Signed   By: Inez Catalina M.D.   On: 11/26/2014 12:18    DISCHARGE INSTRUCTIONS:     Discharge Instructions    Call MD / Call 911    Complete by:  As directed   If you experience chest pain or shortness of breath, CALL 911 and be transported to the hospital emergency room.  If you develope a fever above 101 F, pus (white drainage) or increased drainage or redness at the wound, or calf pain, call your surgeon's office.     Change dressing    Complete by:  As directed   DO NOT CHANGE DRESSING. KEEP ON TILL SEEN IN THE OFFICE     Constipation Prevention    Complete by:  As directed   Drink plenty of fluids.  Prune juice may be helpful.  You may use a stool softener, such as Colace (over the counter) 100 mg twice a day.  Use MiraLax (over the counter) for constipation as needed.     Diet general    Complete by:  As directed      Discharge instructions    Complete by:  As directed   [place] items at home which could result in a fall. This includes throw rugs or furniture in walking pathways ICE to the affected joint every three hours while awake for 30 minutes at a time, for at least the first 3-5 days, and then as needed for pain and swelling.  Continue to use ice for pain and swelling. You may notice swelling that will progress down to the foot and ankle.  This is normal after surgery.  Elevate your leg when you are not up walking on it.   Continue to use the breathing machine you got in the hospital (incentive spirometer) which will help keep your  temperature down.  It is common for your temperature to cycle up and down following surgery, especially at night when you are not up moving around and exerting yourself.  The breathing machine keeps your lungs expanded and your temperature down.   DIET:  As you were doing prior to hospitalization, we recommend a well-balanced diet.  DRESSING / WOUND CARE / SHOWERING  Keep the surgical dressing until follow up.  The dressing is water proof, so you can shower without any extra covering.  IF THE DRESSING FALLS OFF or the wound gets wet inside, change the dressing with sterile gauze.  Please use good hand washing techniques before changing the dressing.  Do not use any lotions or creams on the incision until instructed by your surgeon.    ACTIVITY  Increase activity slowly as tolerated, but follow the weight bearing instructions below.   No driving for 6 weeks or until further direction given by your physician.  You cannot drive while taking narcotics.  No lifting or carrying greater than 10 lbs. until further directed by your surgeon. Avoid periods of inactivity such as sitting longer than an hour when not asleep. This helps prevent blood clots.  You may return to work once you are authorized by your doctor.     WEIGHT BEARING   Weight bearing as tolerated with assist device (walker, cane, etc) as directed, use it as long as suggested by your surgeon or therapist, typically at least 4-6 weeks.   EXERCISES  Results after joint replacement surgery are often greatly improved when you follow the exercise, range of motion  and muscle strengthening exercises prescribed by your doctor. Safety measures are also important to protect the joint from further injury. Any time any of these exercises cause you to have increased pain or swelling, decrease what you are doing until you are comfortable again and then slowly increase them. If you have problems or questions, call your caregiver or physical  therapist for advice.   Rehabilitation is important following a joint replacement. After just a few days of immobilization, the muscles of the leg can become weakened and shrink (atrophy).  These exercises are designed to build up the tone and strength of the thigh and leg muscles and to improve motion. Often times heat used for twenty to thirty minutes before working out will loosen up your tissues and help with improving the range of motion but do not use heat for the first two weeks following surgery (sometimes heat can increase post-operative swelling).   These exercises can be done on a training (exercise) mat, on a table or on a bed. Use whatever works the best and is most comfortable for you.    Use music or television while you are exercising so that the exercises are a pleasant break in your day. This will make your life better with the exercises acting as a break in your routine that you can look forward to.   Perform all exercises about fifteen times, three times per day or as directed.  You should exercise both the operative leg and the other leg as well.   Exercises include:  Quad Sets - Tighten up the muscle on the front of the thigh (Quad) and hold for 5-10 seconds.   Straight Leg Raises - With your knee straight (if you were given a brace, keep it on), lift the leg to 60 degrees, hold for 3 seconds, and slowly lower the leg.  Perform this exercise against resistance later as your leg gets stronger.  Leg Slides: Lying on your back, slowly slide your foot toward your buttocks, bending your knee up off the floor (only go as far as is comfortable). Then slowly slide your foot back down until your leg is flat on the floor again.  Angel Wings: Lying on your back spread your legs to the side as far apart as you can without causing discomfort.  Hamstring Strength:  Lying on your back, push your heel against the floor with your leg straight by tightening up the muscles of your buttocks.  Repeat,  but this time bend your knee to a comfortable angle, and push your heel against the floor.  You may put a pillow under the heel to make it more comfortable if necessary.   A rehabilitation program following joint replacement surgery can speed recovery and prevent re-injury in the future due to weakened muscles. Contact your doctor or a physical therapist for more information on knee rehabilitation.    CONSTIPATION  Constipation is defined medically as fewer than three stools per week and severe constipation as less than one stool per week.  Even if you have a regular bowel pattern at home, your normal regimen is likely to be disrupted due to multiple reasons following surgery.  Combination of anesthesia, postoperative narcotics, change in appetite and fluid intake all can affect your bowels.   YOU MUST use at least one of the following options; they are listed in order of increasing strength to get the job done.  They are all available over the counter, and you may need to use some,  POSSIBLY even all of these options:    Drink plenty of fluids (prune juice may be helpful) and high fiber foods Colace 100 mg by mouth twice a day  Senokot for constipation as directed and as needed Dulcolax (bisacodyl), take with full glass of water  Miralax (polyethylene glycol) once or twice a day as needed.  If you have tried all these things and are unable to have a bowel movement in the first 3-4 days after surgery call either your surgeon or your primary doctor.    If you experience loose stools or diarrhea, hold the medications until you stool forms back up.  If your symptoms do not get better within 1 week or if they get worse, check with your doctor.  If you experience "the worst abdominal pain ever" or develop nausea or vomiting, please contact the office immediately for further recommendations for treatment.   ITCHING:  If you experience itching with your medications, try taking only a single pain pill,  or even half a pain pill at a time.  You can also use Benadryl over the counter for itching or also to help with sleep.   TED HOSE STOCKINGS:  Use stockings on both legs until for at least 2 weeks or as directed by physician office. They may be removed at night for sleeping.  MEDICATIONS:  See your medication summary on the "After Visit Summary" that nursing will review with you.  You may have some home medications which will be placed on hold until you complete the course of blood thinner medication.  It is important for you to complete the blood thinner medication as prescribed.  PRECAUTIONS:  If you experience chest pain or shortness of breath - call 911 immediately for transfer to the hospital emergency department.   If you develop a fever greater that 101 F, purulent drainage from wound, increased redness or drainage from wound, foul odor from the wound/dressing, or calf pain - CONTACT YOUR SURGEON.                                                   FOLLOW-UP APPOINTMENTS:  If you do not already have a post-op appointment, please call the office for an appointment to be seen by your surgeon.  Guidelines for how soon to be seen are listed in your "After Visit Summary", but are typically between 1-4 weeks after surgery.  OTHER INSTRUCTIONS:   Knee Replacement:  Do not place pillow under knee, focus on keeping the knee straight while resting. CPM instructions: 0-90 degrees, 2 hours in the morning, 2 hours in the afternoon, and 2 hours in the evening. Place foam block, curve side up under heel at all times except when in CPM or when walking.  DO NOT modify, tear, cut, or change the foam block in any way.  MAKE SURE YOU:  Understand these instructions.  Get help right away if you are not doing well or get worse.    Thank you for letting us be a part of your medical care team.  It is a privilege we respect greatly.  We hope these instructions will help you stay on track for a fast and full  recovery!     Discharge to SNF when bed available    Complete by:  As directed   [facility]  Driving restrictions    Complete by:  As directed   No driving for 6 weeks     Follow the hip precautions as taught in Physical Therapy    Complete by:  As directed      Increase activity slowly as tolerated    Complete by:  As directed      Lifting restrictions    Complete by:  As directed   No lifting for 6 weeks     Patient may shower    Complete by:  As directed   [place].  DO NOT REMOVE DRESSING     TED hose    Complete by:  As directed   Use stockings (TED hose) for 2 weeks on RIGHT leg(s).  You may remove them at night for sleeping.     Weight bearing as tolerated    Complete by:  As directed   Laterality:  right  Extremity:  Lower           DISCHARGE MEDICATIONS:     Medication List    STOP taking these medications        ciprofloxacin 500 MG tablet  Commonly known as:  CIPRO     naproxen sodium 220 MG tablet  Commonly known as:  ANAPROX     OVER THE COUNTER MEDICATION     traMADol 50 MG tablet  Commonly known as:  ULTRAM      TAKE these medications        clotrimazole-betamethasone cream  Commonly known as:  LOTRISONE  Apply topically 2 (two) times daily as needed (itching).     docusate sodium 50 MG capsule  Commonly known as:  COLACE  Take by mouth daily as needed for mild constipation.     methocarbamol 500 MG tablet  Commonly known as:  ROBAXIN  Take 1 tablet (500 mg total) by mouth every 8 (eight) hours as needed for muscle spasms.     oxyCODONE 5 MG immediate release tablet  Commonly known as:  Oxy IR/ROXICODONE  Take 1-2 tablets (5-10 mg total) by mouth every 4 (four) hours as needed for breakthrough pain.     rivaroxaban 10 MG Tabs tablet  Commonly known as:  XARELTO  Take 1 tablet (10 mg total) by mouth daily with breakfast.     triamterene-hydrochlorothiazide 75-50 MG tablet  Commonly known as:   MAXZIDE  Take 1 tablet by mouth daily.        FOLLOW UP VISIT:   Follow-up Information    Follow up with [facility], Vonna Kotyk, MD. Schedule an appointment as soon as possible for a visit on 12/09/2014.   Specialty:  Orthopedic Surgery   Contact information:   [address] [place] [address] [phone]       DISPOSITION:   Skilled Nursing Facility/Rehab [facility]  CONDITION:  Stable   Mike Craze. [place], [place] [phone]  11/29/2014 9:23 AM

## 2014-11-28 NOTE — Progress Notes (Addendum)
Patient ID: Christopher [name], male   DOB: Jun 08, 1928, 81 y.o.   MRN: [id number] PATIENT ID: Christopher [name]        MRN:  [id number]          DOB/AGE: 10-Sep-1928 / 81 y.o.    Christopher Fears, MD   Christopher Borg, PA-C [address] [place], [place]  [address]                             [phone]   PROGRESS NOTE  Subjective:  negative for Chest Pain  negative for Shortness of Breath  negative for Nausea/Vomiting   negative for Calf Pain    Tolerating Diet: yes         Patient reports pain as mild.     Doing well  Objective: Vital signs in last 24 hours:   Patient Vitals for the past 24 hrs:  BP Temp Temp src Pulse Resp SpO2  11/28/14 0610 125/65 mmHg 98.2 F (36.8 C) Oral 92 18 93 %  11/27/14 2211 (!) 118/59 mmHg 99.1 F (37.3 C) Oral 99 18 96 %  11/27/14 1300 (!) 122/55 mmHg 98.2 F (36.8 C) - 84 18 94 %      Intake/Output from previous day:   11/16 0701 - 11/17 0700 In: 840 [P.O.:840] Out: -    Intake/Output this shift:       Intake/Output      11/16 0701 - 11/17 0700 11/17 0701 - 11/18 0700   P.O. 840    I.V. (mL/kg)     IV Piggyback     Total Intake(mL/kg) 840 (6.9)    Urine (mL/kg/hr)     Blood     Total Output       Net +840          Urine Occurrence 2 x       LABORATORY DATA:  Recent Labs  11/27/14 0515 11/28/14 0625  WBC 7.6 9.7  HGB 11.2* 11.4*  HCT 34.3* 34.9*  PLT 182 183    Recent Labs  11/27/14 0515 11/28/14 0625  NA 139 134*  K 3.6 3.3*  CL 106 101  CO2 27 25  BUN 10 9  CREATININE 1.39* 1.25*  GLUCOSE 127* 98  CALCIUM 8.6* 8.9   Lab Results  Component Value Date   INR 1.13 11/15/2014   INR 1.03 08/13/2010    Recent Radiographic Studies :  Dg Chest 2 View  11/15/2014  CLINICAL DATA:  Preoperative exam prior to total hip arthroplasty, asymptomatic. EXAM: CHEST  2 VIEW COMPARISON:  PA and lateral chest x-ray dated June 19, 2012 FINDINGS: The lungs are adequately inflated. The interstitial markings are coarse. There is no  alveolar infiltrate or pleural effusion. The heart is top-normal in size. The pulmonary vascularity is normal. The mediastinum is normal in width. There is stable tortuosity of the descending thoracic aorta with calcification in the aortic arch. There is mild multilevel degenerative disc disease of the thoracic and upper lumbar spine. IMPRESSION: Stable chronic bronchitic changes. There is no evidence of CHF nor pneumonia. Electronically Signed   By: Christopher  [name] M.D.   On: 11/15/2014 10:12   Dg Hip Port Unilat With Pelvis 1v Right  11/26/2014  CLINICAL DATA:  Status post right hip replacement EXAM: DG HIP (WITH OR WITHOUT PELVIS) 1V PORT RIGHT COMPARISON:  None. FINDINGS: A right hip prosthesis is seen. No acute bony abnormality is noted. Prostate brachytherapy seeds are noted.  The visualized pelvic ring is intact. IMPRESSION: Status post right hip replacement Electronically Signed   By: Christopher [name] M.D.   On: 11/26/2014 12:18     Examination:  General appearance: alert, cooperative and mild distress Resp: clear to auscultation bilaterally Cardio: regular rate and rhythm  Wound Exam: clean, dry, intact   Drainage:  Scant/small amount Bloody exudate  Motor Exam: EHL, FHL, Anterior Tibial and Posterior Tibial Intact  Sensory Exam: Superficial Peroneal, Deep Peroneal and Tibial normal  Vascular Exam: Right posterior tibial artery has trace pulse   Doppler Negative  Assessment:    2 Days Post-Op  Procedure(s) (LRB): TOTAL HIP ARTHROPLASTY (Right)  ADDITIONAL DIAGNOSIS:  Principal Problem:   Primary osteoarthritis of right hip Active Problems:   Severe obesity (BMI >= 40) (HCC)   S/P total hip arthroplasty     Plan: Physical Therapy as ordered Weight Bearing as Tolerated (WBAT)  DVT Prophylaxis:  Xarelto, Foot Pumps and TED hose  DISCHARGE PLAN: Skilled Nursing Facility/Rehab  DISCHARGE NEEDS: HHPT, Walker and 3-in-1 comode seat         Christopher [name]  11/28/2014 8:21 AM

## 2014-11-28 NOTE — Progress Notes (Signed)
Physical Therapy Treatment Patient Details Name: Reylan Vardeman MRN: [id number] DOB: [birth year] Today's Date: 11/28/2014    History of Present Illness Pt admitted for R posterior THA with PMHx of prostate CA    PT Comments    Progressing toward mobility goals as evident by ambulation of 121ft min guard requiring less cues for normalized gait pattern and no c/o increased pain. Overall mobility level min guard/min A, needs mod cues for bed mobility, and recalled 2/3 hip precautions. Continue to progress patient as tolerated with anticipation of d/c to SNF.  Follow Up Recommendations  SNF     Equipment Recommendations  None recommended by PT    Recommendations for Other Services       Precautions / Restrictions Precautions Precautions: Posterior Hip;Fall Restrictions Weight Bearing Restrictions: Yes RLE Weight Bearing: Weight bearing as tolerated    Mobility  Bed Mobility Overal bed mobility: Needs Assistance Bed Mobility: Supine to Sit     Supine to sit: HOB elevated;Min assist Sit to supine: Min assist   General bed mobility comments: assistance with R LE to EOB and raising trunk from sidelying postion with verbal cues for seqeuncing  Transfers Overall transfer level: Needs assistance Equipment used: Rolling walker (2 wheeled)   Sit to Stand: Min guard (two attempts before coming to standing position)         General transfer comment: minimal vc for hand placement  Ambulation/Gait Ambulation/Gait assistance: Min guard Ambulation Distance (Feet): 120 Feet Assistive device: Rolling walker (2 wheeled) Gait Pattern/deviations: Decreased stride length;Antalgic;Decreased stance time - left Gait velocity: decreased   General Gait Details: vc for increased stride lenght, maintaining upright posture   Stairs            Wheelchair Mobility    Modified Rankin (Stroke Patients Only)       Balance Overall balance assessment: Needs  assistance Sitting-balance support: No upper extremity supported Sitting balance-Leahy Scale: Good     Standing balance support: Bilateral upper extremity supported Standing balance-Leahy Scale: Poor                      Cognition Arousal/Alertness: Awake/alert Behavior During Therapy: WFL for tasks assessed/performed Overall Cognitive Status: Within Functional Limits for tasks assessed                      Exercises Total Joint Exercises Ankle Circles/Pumps: AROM;Strengthening;Right;10 reps Quad Sets: AROM;Strengthening;Right;10 reps Short Arc Quad: AROM;Strengthening;Right;10 reps Heel Slides: AROM;Strengthening;Right;5 reps Hip ABduction/ADduction: AROM;Strengthening;Right;20 reps    General Comments        Pertinent Vitals/Pain Pain Assessment: 0-10 Pain Score: 6  Pain Descriptors / Indicators: Sore Pain Intervention(s): Limited activity within patient's tolerance;Premedicated before session;Monitored during session    Home Living                      Prior Function            PT Goals (current goals can now be found in the care plan section) Acute Rehab PT Goals Patient Stated Goal: Pt wishes to get back home to wife PT Goal Formulation: With patient Time For Goal Achievement: 12/03/14 Potential to Achieve Goals: Good    Frequency  7X/week    PT Plan      Co-evaluation             End of Session Equipment Utilized During Treatment: Gait belt;Right knee immobilizer Activity Tolerance: Patient tolerated treatment well Patient left: in chair;with  call bell/phone within reach;with family/visitor present     Time: [id number] PT Time Calculation (min) (ACUTE ONLY): 27 min  Charges:  $Gait Training: 8-22 mins $Therapeutic Exercise: 8-22 mins                    G Codes:      Darliss Cheney, PTA [phone] 11/28/2014, 3:41 PM

## 2014-11-29 DIAGNOSIS — E46 Unspecified protein-calorie malnutrition: Secondary | ICD-10-CM | POA: Diagnosis not present

## 2014-11-29 DIAGNOSIS — E871 Hypo-osmolality and hyponatremia: Secondary | ICD-10-CM | POA: Diagnosis not present

## 2014-11-29 DIAGNOSIS — M25551 Pain in right hip: Secondary | ICD-10-CM | POA: Diagnosis not present

## 2014-11-29 DIAGNOSIS — Z471 Aftercare following joint replacement surgery: Secondary | ICD-10-CM | POA: Diagnosis not present

## 2014-11-29 DIAGNOSIS — M6281 Muscle weakness (generalized): Secondary | ICD-10-CM | POA: Diagnosis not present

## 2014-11-29 DIAGNOSIS — I10 Essential (primary) hypertension: Secondary | ICD-10-CM | POA: Diagnosis not present

## 2014-11-29 DIAGNOSIS — D62 Acute posthemorrhagic anemia: Secondary | ICD-10-CM | POA: Diagnosis not present

## 2014-11-29 DIAGNOSIS — M1611 Unilateral primary osteoarthritis, right hip: Secondary | ICD-10-CM | POA: Diagnosis not present

## 2014-11-29 DIAGNOSIS — N189 Chronic kidney disease, unspecified: Secondary | ICD-10-CM | POA: Diagnosis not present

## 2014-11-29 DIAGNOSIS — R609 Edema, unspecified: Secondary | ICD-10-CM | POA: Diagnosis not present

## 2014-11-29 DIAGNOSIS — N289 Disorder of kidney and ureter, unspecified: Secondary | ICD-10-CM | POA: Diagnosis not present

## 2014-11-29 DIAGNOSIS — R2681 Unsteadiness on feet: Secondary | ICD-10-CM | POA: Diagnosis not present

## 2014-11-29 DIAGNOSIS — Z96641 Presence of right artificial hip joint: Secondary | ICD-10-CM | POA: Diagnosis not present

## 2014-11-29 LAB — BASIC METABOLIC PANEL
Anion gap: 10 (ref 5–15)
BUN: 14 mg/dL (ref 6–20)
CALCIUM: 9 mg/dL (ref 8.9–10.3)
CHLORIDE: 99 mmol/L — AB (ref 101–111)
CO2: 25 mmol/L (ref 22–32)
CREATININE: 1.46 mg/dL — AB (ref 0.61–1.24)
GFR, EST AFRICAN AMERICAN: 49 mL/min — AB (ref >60)
GFR, EST NON AFRICAN AMERICAN: 42 mL/min — AB (ref >60)
Glucose, Bld: 88 mg/dL (ref 65–99)
Potassium: 3.9 mmol/L (ref 3.5–5.1)
SODIUM: 134 mmol/L — AB (ref 135–145)

## 2014-11-29 LAB — CBC
HCT: 35.5 % — ABNORMAL LOW (ref 39.0–52.0)
HEMOGLOBIN: 11.6 g/dL — AB (ref 13.0–17.0)
MCH: 29.3 pg (ref 26.0–34.0)
MCHC: 32.7 g/dL (ref 30.0–36.0)
MCV: 89.6 fL (ref 78.0–100.0)
Platelets: 188 10*3/uL (ref 150–400)
RBC: 3.96 MIL/uL — ABNORMAL LOW (ref 4.22–5.81)
RDW: 12.4 % (ref 11.5–15.5)
WBC: 9.7 10*3/uL (ref 4.0–10.5)

## 2014-11-29 NOTE — Progress Notes (Signed)
RN placed two fresh frozen ice packs in knee immobilizer and applied to patients right knee. Nursing will continue to monitor.

## 2014-11-29 NOTE — Progress Notes (Signed)
Pt isbeing discharged to [facility]. Discharge packages were given to transporter. Pt AOX4 and Pericare performed before patient is transferred. Report was called and given to [facility]

## 2014-11-29 NOTE — Discharge Planning (Signed)
Patient to discharge to [facility]. Patient updated at bedside.  Facility: [facility] RN report number: [phone] Transportation: [place], [place] Orthopedics: [phone] Surgical: [phone]

## 2014-11-29 NOTE — Progress Notes (Signed)
Physical Therapy Treatment Patient Details Name: Christopher [name] MRN: [id number] DOB: May 11, 1928 Today's Date: 11/29/2014    History of Present Illness Pt admitted for R posterior THA with PMHx of prostate CA    PT Comments    Patient continues to progress toward PT goals. Overall assistance min guard/min A. Patient able to recall 3/3 hip and demonstrates compliance. Patient displayed increased ROM and strength when performing HEP but is limited by pain in Rt calf to ankle when ambulating. Continue progressing patient as tolerated with anticipation of d/c to SNF.  Follow Up Recommendations  SNF     Equipment Recommendations  None recommended by PT    Recommendations for Other Services       Precautions / Restrictions Precautions Precautions: Posterior Hip;Fall Precaution Comments: recalled 3/3 precautions  Restrictions Weight Bearing Restrictions: Yes RLE Weight Bearing: Weight bearing as tolerated    Mobility  Bed Mobility Overal bed mobility: Needs Assistance Bed Mobility: Supine to Sit     Supine to sit: HOB elevated;Min guard;Min assist (assistance elevating trunk to full upright position)     General bed mobility comments: cues for sequencing; use of Lt bed rail  Transfers Overall transfer level: Needs assistance Equipment used: Rolling walker (2 wheeled) Transfers: Sit to/from Stand Sit to Stand: Min guard (two attempts before coming to standing position )         General transfer comment: vc for hand placement  Ambulation/Gait Ambulation/Gait assistance: Min guard Ambulation Distance (Feet): 120 Feet Assistive device: Rolling walker (2 wheeled) Gait Pattern/deviations: Step-through pattern;Antalgic (increased WB through bilateral UE) Gait velocity: decreased   General Gait Details: vc for increased stride lenght, maintaining upright posture (ability to correct with cues)   Stairs            Wheelchair Mobility    Modified Rankin (Stroke  Patients Only)       Balance Overall balance assessment: Needs assistance Sitting-balance support: No upper extremity supported Sitting balance-Leahy Scale: Good     Standing balance support: Bilateral upper extremity supported;During functional activity Standing balance-Leahy Scale: Fair                      Cognition Arousal/Alertness: Awake/alert Behavior During Therapy: WFL for tasks assessed/performed Overall Cognitive Status: Within Functional Limits for tasks assessed                      Exercises Total Joint Exercises Ankle Circles/Pumps: AROM;Strengthening;Right;10 reps Quad Sets: AROM;Strengthening;Right;10 reps Heel Slides: AROM;Strengthening;Right;5 reps Hip ABduction/ADduction: AROM;Strengthening;Right;20 reps Straight Leg Raises: AROM;Strengthening;Right;10 reps;Supine    General Comments        Pertinent Vitals/Pain Pain Assessment: 0-10 Pain Score: 2  (pain only with movement; denies pain static sitting/standing) Pain Location: Rt calf to ankle; reported "not so much in hip" Pain Descriptors / Indicators: Sore;Grimacing;Tender Pain Intervention(s): Limited activity within patient's tolerance;Monitored during session;Premedicated before session    Home Living                      Prior Function            PT Goals (current goals can now be found in the care plan section) Acute Rehab PT Goals Patient Stated Goal: Pt wishes to get back home to wife PT Goal Formulation: With patient Time For Goal Achievement: 12/03/14 Potential to Achieve Goals: Good    Frequency  7X/week    PT Plan      Co-evaluation  End of Session Equipment Utilized During Treatment: Gait belt;Right knee immobilizer Activity Tolerance: Patient tolerated treatment well Patient left: in chair;with call bell/phone within reach;with family/visitor present     Time: 1031-1100 PT Time Calculation (min) (ACUTE ONLY): 29  min  Charges:  $Gait Training: 8-22 mins $Therapeutic Exercise: 8-22 mins                    G CodesDarliss Cheney, PTA [phone] 11/29/2014, 11:21 AM

## 2014-11-29 NOTE — Care Management Important Message (Signed)
Important Message  Patient Details  Name: Christopher [name] MRN: [id number] Date of Birth: 15-Oct-1928   Medicare Important Message Given:  Yes    [name] 11/29/2014, 2:45 PM

## 2014-11-29 NOTE — Clinical Social Work Placement (Signed)
   CLINICAL SOCIAL WORK PLACEMENT  NOTE  Date:  11/29/2014  Patient Details  Name: Christopher [name] MRN: [id number] Date of Birth: 15-Oct-1928  Clinical Social Work is seeking post-discharge placement for this patient at the Agency level of care (*CSW will initial, date and re-position this form in  chart as items are completed):  Yes   Patient/family provided with [place] Work Department's list of facilities offering this level of care within the geographic area requested by the patient (or if unable, by the patient's family).  Yes   Patient/family informed of their freedom to choose among providers that offer the needed level of care, that participate in Medicare, Medicaid or managed care program needed by the patient, have an available bed and are willing to accept the patient.  Yes   Patient/family informed of [facility]'s ownership interest in [facility] and [facility], as well as of the fact that they are under no obligation to receive care at these facilities.  PASRR submitted to EDS on       PASRR number received on       Existing PASRR number confirmed on 11/27/14     FL2 transmitted to all facilities in geographic area requested by pt/family on 11/27/14     FL2 transmitted to all facilities within larger geographic area on       Patient informed that his/her managed care company has contracts with or will negotiate with certain facilities, including the following:        Yes   Patient/family informed of bed offers received.  Patient chooses bed at [facility]     Physician recommends and patient chooses bed at      Patient to be transferred to [facility] on 11/29/14.  Patient to be transferred to facility by [facility]     Patient family notified on 11/29/14 of transfer.  Name of family member notified:  Patient at bedside     PHYSICIAN       Additional Comment:     _______________________________________________ Caroline Sauger, LCSW 11/29/2014, 10:34 AM

## 2014-11-29 NOTE — Progress Notes (Signed)
Patient ID: Christopher [name], male   DOB: Feb 02, 1928, 81 y.o.   MRN: [id number] PATIENT ID: Christopher [name]        MRN:  [id number]          DOB/AGE: Feb 03, 1928 / 81 y.o.    Joni Fears, MD   Biagio Borg, PA-C [address] [place], [place]  [address]                             [phone]   PROGRESS NOTE  Subjective:  negative for Chest Pain  negative for Shortness of Breath  negative for Nausea/Vomiting   positive for Calf Pain-Doppler neg for DVT    Tolerating Diet: yes         Patient reports pain as mild.    Objective: Vital signs in last 24 hours:   Patient Vitals for the past 24 hrs:  BP Temp Temp src Pulse Resp SpO2  11/29/14 0605 126/70 mmHg 99.3 F (37.4 C) Oral 88 18 92 %  11/28/14 2045 124/68 mmHg 99.3 F (37.4 C) Oral 94 18 94 %  11/28/14 1231 (!) 125/55 mmHg - - 89 20 93 %      Intake/Output from previous day:   11/17 0701 - 11/18 0700 In: 600 [P.O.:600] Out: 1475 [Urine:1475]   Intake/Output this shift:       Intake/Output      11/17 0701 - 11/18 0700 11/18 0701 - 11/19 0700   P.O. 600    Total Intake(mL/kg) 600 (4.9)    Urine (mL/kg/hr) 1475 (0.5)    Total Output 1475     Net -875          Urine Occurrence 2 x       LABORATORY DATA:  Recent Labs  11/27/14 0515 11/28/14 0625 11/29/14 0425  WBC 7.6 9.7 9.7  HGB 11.2* 11.4* 11.6*  HCT 34.3* 34.9* 35.5*  PLT 182 183 188    Recent Labs  11/27/14 0515 11/28/14 0625 11/29/14 0425  NA 139 134* 134*  K 3.6 3.3* 3.9  CL 106 101 99*  CO2 27 25 25   BUN 10 9 14   CREATININE 1.39* 1.25* 1.46*  GLUCOSE 127* 98 88  CALCIUM 8.6* 8.9 9.0   Lab Results  Component Value Date   INR 1.13 11/15/2014   INR 1.03 08/13/2010    Recent Radiographic Studies :  Dg Chest 2 View  11/15/2014  CLINICAL DATA:  Preoperative exam prior to total hip arthroplasty, asymptomatic. EXAM: CHEST  2 VIEW COMPARISON:  PA and lateral chest x-ray dated June 19, 2012 FINDINGS: The lungs are adequately inflated. The  interstitial markings are coarse. There is no alveolar infiltrate or pleural effusion. The heart is top-normal in size. The pulmonary vascularity is normal. The mediastinum is normal in width. There is stable tortuosity of the descending thoracic aorta with calcification in the aortic arch. There is mild multilevel degenerative disc disease of the thoracic and upper lumbar spine. IMPRESSION: Stable chronic bronchitic changes. There is no evidence of CHF nor pneumonia. Electronically Signed   By: David  Martinique M.D.   On: 11/15/2014 10:12   Dg Hip Port Unilat With Pelvis 1v Right  11/26/2014  CLINICAL DATA:  Status post right hip replacement EXAM: DG HIP (WITH OR WITHOUT PELVIS) 1V PORT RIGHT COMPARISON:  None. FINDINGS: A right hip prosthesis is seen. No acute bony abnormality is noted. Prostate brachytherapy seeds are noted. The visualized pelvic ring is intact.  IMPRESSION: Status post right hip replacement Electronically Signed   By: Inez Catalina M.D.   On: 11/26/2014 12:18     Examination:  General appearance: alert, cooperative and no distress  Wound Exam: clean, dry, intact   Drainage:  None: wound tissue dry  Motor Exam: EHL, FHL, Anterior Tibial and Posterior Tibial Intact  Sensory Exam: Superficial Peroneal, Deep Peroneal and Tibial normal  Vascular Exam: Normal  Assessment:    3 Days Post-Op  Procedure(s) (LRB): TOTAL HIP ARTHROPLASTY (Right)  ADDITIONAL DIAGNOSIS:  Principal Problem:   Primary osteoarthritis of right hip Active Problems:   Severe obesity (BMI >= 40) (HCC)   S/P total hip arthroplasty  no new problems   Plan: Physical Therapy as ordered Weight Bearing as Tolerated (WBAT)  DVT Prophylaxis:  Xarelto and TED hose  DISCHARGE PLAN: Skilled Nursing Facility/Rehab  DISCHARGE NEEDS: HHPT, Walker and 3-in-1 comode seat   will plan on D/C today to [facility] for rehab     Joni Fears W  11/29/2014 7:53 AM

## 2014-11-29 NOTE — Progress Notes (Signed)
PT Cancellation Note  Patient Details Name: Christopher [name] MRN: [id number] DOB: 1928-10-19   Cancelled Treatment:    Reason Eval/Treat Not Completed: Other (comment) (Getting ready to d/c to SNF; patient c/o pain and declined tx this pm.)   Darliss Cheney, PTA [phone] 11/29/2014, 2:16 PM

## 2014-12-02 ENCOUNTER — Non-Acute Institutional Stay (SKILLED_NURSING_FACILITY): Payer: Medicare Other | Admitting: Internal Medicine

## 2014-12-02 DIAGNOSIS — D62 Acute posthemorrhagic anemia: Secondary | ICD-10-CM | POA: Diagnosis not present

## 2014-12-02 DIAGNOSIS — R2681 Unsteadiness on feet: Secondary | ICD-10-CM

## 2014-12-02 DIAGNOSIS — N289 Disorder of kidney and ureter, unspecified: Secondary | ICD-10-CM | POA: Diagnosis not present

## 2014-12-02 DIAGNOSIS — M1611 Unilateral primary osteoarthritis, right hip: Secondary | ICD-10-CM | POA: Diagnosis not present

## 2014-12-02 DIAGNOSIS — I10 Essential (primary) hypertension: Secondary | ICD-10-CM

## 2014-12-02 DIAGNOSIS — E871 Hypo-osmolality and hyponatremia: Secondary | ICD-10-CM

## 2014-12-02 NOTE — Progress Notes (Signed)
Patient ID: Christopher [name], male   DOB: 04-16-1928, 81 y.o.   MRN: [id number]      [facility]   PCP: Mathews Argyle, MD  Code Status: full code  Allergies  Allergen Reactions  . Chicken Allergy Anaphylaxis  . Penicillins Itching and Swelling    Chief Complaint  Patient presents with  . New Admit To SNF     HPI:  81 y.o. patient is here for short term rehabilitation post hospital admission from 11/26/14-11/29/14 with right hip OA. He underwent right hip arthroplasty. He is seen in his room today. His pain is under control with current pain regimen. He denies any concerns this visit.   Review of Systems:  Constitutional: Negative for fever, chills, diaphoresis.  HENT: Negative for headache, congestion, nasal discharge, difficulty swallowing.   Eyes: Negative for eye pain, blurred vision, double vision and discharge.  Respiratory: Negative for cough, shortness of breath and wheezing.   Cardiovascular: Negative for chest pain, palpitations, leg swelling.  Gastrointestinal: Negative for heartburn, nausea, vomiting, abdominal pain. Had 2 bowel movement yesterday and 1 today Genitourinary: Negative for dysuria, flank pain.  Musculoskeletal: Negative for back pain, falls at the facility. Skin: Negative for itching, rash.  Neurological: Negative for dizziness, tingling, focal weakness Psychiatric/Behavioral: Negative for depression.   Past Medical History  Diagnosis Date  . Chronic kidney disease   . Cancer [facility]) 2005    prostate cancer treated with external beam radiation and radioactive seeds  . DJD (degenerative joint disease)     thumbs and knees  . Constipation     takes Colace daily as needed  . Peripheral edema     takes Maxzide daily  . Pneumonia     hx of-15+yrs ago  . Leg cramps     no meds  . Decreased hearing   . Joint pain   . Joint swelling   . Ringing in ears   . History of gout   . Bruises easily     both hands   . Diverticulitis 2003  . History of colon polyps     benign  . UTI (lower urinary tract infection)     just completed antibiotic on 11/14/14  . Cataracts, bilateral     immature   Past Surgical History  Procedure Laterality Date  . Removal of chest wall tumor    . Hemorrhoid surgery  1970  . Sigmoid colon resection    . Total knee arthroplasty  08-2010  . Partial colectomy    . Endovenous ablation saphenous vein w/ laser  12-01-2011    left greater saphenous vein by Curt Jews MD   . Endovenous ablation saphenous vein w/ laser  12-22-2011    right greater saphenous vein    by Curt Jews MD  . Appendectomy  [id number]  . Prostate seeds     . Colonoscopy    . Total hip arthroplasty Right 11/26/2014    Procedure: TOTAL HIP ARTHROPLASTY;  Surgeon: Garald Balding, MD;  Location: [facility];  Service: Orthopedics;  Laterality: Right;   Social History:   reports that he has quit smoking. His smoking use included Cigarettes. He has a 12 pack-year smoking history. He has never used smokeless tobacco. He reports that he does not drink alcohol or use illicit drugs.  Family History  Problem Relation Age of Onset  . Kidney disease Mother     Medications:   Medication List       This list  is accurate as of: 12/02/14 10:03 AM.  Always use your most recent med list.               clotrimazole-betamethasone cream  Commonly known as:  LOTRISONE  Apply topically 2 (two) times daily as needed (itching).     docusate sodium 50 MG capsule  Commonly known as:  COLACE  Take by mouth daily as needed for mild constipation.     methocarbamol 500 MG tablet  Commonly known as:  ROBAXIN  Take 1 tablet (500 mg total) by mouth every 8 (eight) hours as needed for muscle spasms.     oxyCODONE 5 MG immediate release tablet  Commonly known as:  Oxy IR/ROXICODONE  Take 1-2 tablets (5-10 mg total) by mouth every 4 (four) hours as needed for breakthrough pain.     rivaroxaban 10 MG Tabs tablet    Commonly known as:  XARELTO  Take 1 tablet (10 mg total) by mouth daily with breakfast.     triamterene-hydrochlorothiazide 75-50 MG tablet  Commonly known as:  MAXZIDE  Take 1 tablet by mouth daily.         Physical Exam: Filed Vitals:   12/02/14 1002  BP: 118/73  Pulse: 88  Temp: 98 F (36.7 C)  Resp: 16  SpO2: 96%    General- elderly male, obese, in no acute distress Head- normocephalic, atraumatic Nose- normal nasal mucosa, no maxillary or frontal sinus tenderness, no nasal discharge Throat- moist mucus membrane Eyes- PERRLA, EOMI, no pallor, no icterus, no discharge, normal conjunctiva, normal sclera Neck- no cervical lymphadenopathy Cardiovascular- normal s1,s2, no murmurs, palpable dorsalis pedis and radial pulses, trace leg edema Respiratory- bilateral clear to auscultation, no wheeze, no rhonchi, no crackles, no use of accessory muscles Abdomen- bowel sounds present, soft, non tender Musculoskeletal- able to move all 4 extremities, limited right hip range of motion  Neurological- no focal deficit, alert and oriented to person, place and time Skin- warm and dry, right hip surgical incision with aquacel dressing in place Psychiatry- normal mood and affect    Labs reviewed: Basic Metabolic Panel:  Recent Labs  11/27/14 0515 11/28/14 0625 11/29/14 0425  NA 139 134* 134*  K 3.6 3.3* 3.9  CL 106 101 99*  CO2 27 25 25   GLUCOSE 127* 98 88  BUN 10 9 14   CREATININE 1.39* 1.25* 1.46*  CALCIUM 8.6* 8.9 9.0   Liver Function Tests:  Recent Labs  11/15/14 0958  AST 22  ALT 17  ALKPHOS 48  BILITOT 0.8  PROT 6.7  ALBUMIN 3.6   No results for input(s): LIPASE, AMYLASE in the last 8760 hours. No results for input(s): AMMONIA in the last 8760 hours. CBC:  Recent Labs  11/15/14 0958 11/27/14 0515 11/28/14 0625 11/29/14 0425  WBC 7.1 7.6 9.7 9.7  NEUTROABS 4.5  --   --   --   HGB 14.4 11.2* 11.4* 11.6*  HCT 43.5 34.3* 34.9* 35.5*  MCV 88.6 89.8  88.8 89.6  PLT 285 182 183 188    Radiological Exams: Dg Chest 2 View  11/15/2014  CLINICAL DATA:  Preoperative exam prior to total hip arthroplasty, asymptomatic. EXAM: CHEST  2 VIEW COMPARISON:  PA and lateral chest x-ray dated June 19, 2012 FINDINGS: The lungs are adequately inflated. The interstitial markings are coarse. There is no alveolar infiltrate or pleural effusion. The heart is top-normal in size. The pulmonary vascularity is normal. The mediastinum is normal in width. There is stable tortuosity of the descending  thoracic aorta with calcification in the aortic arch. There is mild multilevel degenerative disc disease of the thoracic and upper lumbar spine. IMPRESSION: Stable chronic bronchitic changes. There is no evidence of CHF nor pneumonia. Electronically Signed   By: David  Martinique M.D.   On: 11/15/2014 10:12    Assessment/Plan  Unsteady gait Post right hip surgery, Will have patient work with PT/OT as tolerated to regain strength and restore function.  Fall precautions are in place.  Right hip OA S/p right hip arthroplasty. Continue oxyIR 5 mg 1-2 tab q4h prn pain and robaxin 500 mg q8h prn muscle spasm. Continue xarelto 10 mg daily for dvt prophylaxis. Will have him work with physical therapy and occupational therapy team to help with gait training and muscle strengthening exercises.fall precautions. Skin care. Encourage to be out of bed. Has follow up with orthopedics. Patient refusing ted hose. Monitor bowel movement  Blood loss anemia Post op, monitor h&h  Hyponatremia Check bmp, him being on HCTZ could be contributing some  HTN Stable bp, continue triamterene hctz 75-50 mg daily, monitor bp daily for now  Acute renal impairment Unclear if has CKD, has hx of HTN. Monitor bmp. Consider discontinuation of HTTZ if renal function worsens   Goals of care: short term rehabilitation   Labs/tests ordered: cbc, cmp  Family/ staff Communication: reviewed care plan with  patient and nursing supervisor    Blanchie Serve, MD  [facility] [phone] (Monday-Friday 8 am - 5 pm) [phone] (afterhours)

## 2014-12-03 LAB — CBC AND DIFFERENTIAL
HEMATOCRIT: 36 % — AB (ref 41–53)
HEMOGLOBIN: 11.5 g/dL — AB (ref 13.5–17.5)
Platelets: 383 10*3/uL (ref 150–399)
WBC: 8.9 10^3/mL

## 2014-12-09 ENCOUNTER — Non-Acute Institutional Stay (SKILLED_NURSING_FACILITY): Payer: Medicare Other | Admitting: Adult Health

## 2014-12-09 ENCOUNTER — Encounter: Payer: Self-pay | Admitting: Adult Health

## 2014-12-09 DIAGNOSIS — N289 Disorder of kidney and ureter, unspecified: Secondary | ICD-10-CM | POA: Diagnosis not present

## 2014-12-09 DIAGNOSIS — D62 Acute posthemorrhagic anemia: Secondary | ICD-10-CM

## 2014-12-09 DIAGNOSIS — E871 Hypo-osmolality and hyponatremia: Secondary | ICD-10-CM | POA: Diagnosis not present

## 2014-12-09 DIAGNOSIS — I10 Essential (primary) hypertension: Secondary | ICD-10-CM | POA: Diagnosis not present

## 2014-12-09 DIAGNOSIS — M1611 Unilateral primary osteoarthritis, right hip: Secondary | ICD-10-CM

## 2014-12-09 DIAGNOSIS — E46 Unspecified protein-calorie malnutrition: Secondary | ICD-10-CM | POA: Diagnosis not present

## 2014-12-09 NOTE — Progress Notes (Addendum)
Patient ID: Christopher [name], male   DOB: Jul 20, 1928, 81 y.o.   MRN: [id number]    DATE:  12/09/2014   MRN:  [id number]  BIRTHDAY: Jan 22, 1928  Facility:  Nursing Home Location:  [place] Room Number: 807-P  LEVEL OF CARE:  SNF [phone])  Contact Information    Name Relation Home Work [facility] Spouse [phone]     Herb Grays [phone]  [phone]   Venancio Poisson [phone]  [phone]       Chief Complaint  Patient presents with  . Discharge Note    Osteoarthritis S/P right total hip arthroplasty, hypertension, anemia, hyponatremia and acute renal impairment    HISTORY OF PRESENT ILLNESS:  This is an 81 year old male who is for discharge home with home health PT for endurance and OT for ADLs. He was admitted to [facility] on 11/29/14 from [facility]. He has PMH of chronic kidney disease, hyperlipidemia, prostate cancer, diverticulosis and diminished right hearing. He has osteoarthritis of right hip for which she had right total hip arthroplasty on 11/26/14.  Patient was admitted to this facility for short-term rehabilitation after the patient's recent hospitalization.  Patient has completed SNF rehabilitation and therapy has cleared the patient for discharge.   PAST MEDICAL HISTORY:  Past Medical History  Diagnosis Date  . Chronic kidney disease   . Cancer [facility]) 2005    prostate cancer treated with external beam radiation and radioactive seeds  . DJD (degenerative joint disease)     thumbs and knees  . Constipation     takes Colace daily as needed  . Peripheral edema     takes Maxzide daily  . Pneumonia     hx of-15+yrs ago  . Leg cramps     no meds  . Decreased hearing   . Joint pain   . Joint swelling   . Ringing in ears   . History of gout   . Bruises easily     both hands  . Diverticulitis 2003  . History of colon polyps     benign  . UTI (lower urinary tract infection)       just completed antibiotic on 11/14/14  . Cataracts, bilateral     immature     CURRENT MEDICATIONS: Reviewed  Patient's Medications  New Prescriptions   No medications on file  Previous Medications   CLOTRIMAZOLE-BETAMETHASONE (LOTRISONE) CREAM    Apply topically 2 (two) times daily as needed (itching).    DOCUSATE SODIUM (COLACE) 100 MG CAPSULE    Take 100 mg by mouth daily as needed for mild constipation.   METHOCARBAMOL (ROBAXIN) 500 MG TABLET    Take 1 tablet (500 mg total) by mouth every 8 (eight) hours as needed for muscle spasms.   OXYCODONE (OXY IR/ROXICODONE) 5 MG IMMEDIATE RELEASE TABLET    Take 1-2 tablets (5-10 mg total) by mouth every 4 (four) hours as needed for breakthrough pain.   PROTEIN (PROCEL 100) POWD    Take 1 scoop by mouth 2 (two) times daily.   RIVAROXABAN (XARELTO) 10 MG TABS TABLET    Take 1 tablet (10 mg total) by mouth daily with breakfast.   TRIAMTERENE-HYDROCHLOROTHIAZIDE (MAXZIDE) 75-50 MG PER TABLET    Take 1 tablet by mouth daily.  Modified Medications   No medications on file  Discontinued Medications   No medications on file     Allergies  Allergen Reactions  . Chicken Allergy Anaphylaxis  . Penicillins Itching  and Swelling     REVIEW OF SYSTEMS:  GENERAL: no change in appetite, no fatigue, no weight changes, no fever, chills or weakness EYES: Denies change in vision, dry eyes, eye pain, itching or discharge EARS: Denies change in hearing, ringing in ears, or earache NOSE: Denies nasal congestion or epistaxis MOUTH and THROAT: Denies oral discomfort, gingival pain or bleeding, pain from teeth or hoarseness   RESPIRATORY: no cough, SOB, DOE, wheezing, hemoptysis CARDIAC: no chest pain, edema or palpitations GI: no abdominal pain, diarrhea, constipation, heart burn, nausea or vomiting GU: Denies dysuria, frequency, hematuria, incontinence, or discharge PSYCHIATRIC: Denies feeling of depression or anxiety. No report of hallucinations,  insomnia, paranoia, or agitation   PHYSICAL EXAMINATION  GENERAL APPEARANCE: Well nourished. In no acute distress. Obese SKIN:  Right hip surgical incision has Steri-Strips, dry, no erythema HEAD: Normal in size and contour. No evidence of trauma EYES: Lids open and close normally. No blepharitis, entropion or ectropion. PERRL. Conjunctivae are clear and sclerae are white. Lenses are without opacity EARS: Pinnae are normal. Patient hears normal voice tunes of the examiner MOUTH and THROAT: Lips are without lesions. Oral mucosa is moist and without lesions. Tongue is normal in shape, size, and color and without lesions NECK: supple, trachea midline, no neck masses, no thyroid tenderness, no thyromegaly LYMPHATICS: no LAN in the neck, no supraclavicular LAN RESPIRATORY: breathing is even & unlabored, BS CTAB CARDIAC: RRR, no murmur,no extra heart sounds, no edema GI: abdomen soft, normal BS, no masses, no tenderness, no hepatomegaly, no splenomegaly EXTREMITIES:  Able to move 4 extremities PSYCHIATRIC: Alert and oriented X 3. Affect and behavior are appropriate  LABS/RADIOLOGY: Labs reviewed: Basic Metabolic Panel:  Recent Labs  11/27/14 0515 11/28/14 0625 11/29/14 0425  NA 139 134* 134*  K 3.6 3.3* 3.9  CL 106 101 99*  CO2 27 25 25   GLUCOSE 127* 98 88  BUN 10 9 14   CREATININE 1.39* 1.25* 1.46*  CALCIUM 8.6* 8.9 9.0   Liver Function Tests:  Recent Labs  11/15/14 0958  AST 22  ALT 17  ALKPHOS 48  BILITOT 0.8  PROT 6.7  ALBUMIN 3.6   CBC:  Recent Labs  11/15/14 0958 11/27/14 0515 11/28/14 0625 11/29/14 0425 12/03/14  WBC 7.1 7.6 9.7 9.7 8.9  NEUTROABS 4.5  --   --   --   --   HGB 14.4 11.2* 11.4* 11.6* 11.5*  HCT 43.5 34.3* 34.9* 35.5* 36*  MCV 88.6 89.8 88.8 89.6  --   PLT 285 182 183 188 383    Dg Chest 2 View  11/15/2014  CLINICAL DATA:  Preoperative exam prior to total hip arthroplasty, asymptomatic. EXAM: CHEST  2 VIEW COMPARISON:  PA and lateral  chest x-ray dated June 19, 2012 FINDINGS: The lungs are adequately inflated. The interstitial markings are coarse. There is no alveolar infiltrate or pleural effusion. The heart is top-normal in size. The pulmonary vascularity is normal. The mediastinum is normal in width. There is stable tortuosity of the descending thoracic aorta with calcification in the aortic arch. There is mild multilevel degenerative disc disease of the thoracic and upper lumbar spine. IMPRESSION: Stable chronic bronchitic changes. There is no evidence of CHF nor pneumonia. Electronically Signed   By: David  Martinique M.D.   On: 11/15/2014 10:12   Dg Hip Port Unilat With Pelvis 1v Right  11/26/2014  CLINICAL DATA:  Status post right hip replacement EXAM: DG HIP (WITH OR WITHOUT PELVIS) 1V PORT RIGHT COMPARISON:  None. FINDINGS: A right hip prosthesis is seen. No acute bony abnormality is noted. Prostate brachytherapy seeds are noted. The visualized pelvic ring is intact. IMPRESSION: Status post right hip replacement Electronically Signed   By: Inez Catalina M.D.   On: 11/26/2014 12:18    ASSESSMENT/PLAN:  Osteoarthritis S/P right total hip arthroplasty - for home health PT and OT; continue oxycodone 5 mg 1-2 tabs by mouth every 4 hours when necessary for pain; Robaxin 500 mg 1 tab by mouth every 8 hours when necessary for muscle spasm; Xarelto 10 mg 1 tab by mouth daily for DVT prophylaxis; RLE WBAT; follow-up with Dr. Garnette Czech, orthopedic surgeon  Hypertension - well-controlled; continue Maxzide 50-75 mg 1 tab by mouth daily   Anemia, acute blood loss - hemoglobin 11.5; recheck hemoglobin 11.5; stable  Hyponatremia - sodium 134; recheck sodium 140  Acute renal impairment - creatinine 1.46; re-check creatinine 1.63; recheck BMP in one week  Protein calorie malnutrition - albumin 3.15; start Procel 1 scoop by mouth twice a day      I have filled out patient's discharge paperwork and written prescriptions.  Patient will  receive home health PT and OT.  Total discharge time: Less than 30 minutes  Discharge time involved coordination of the discharge process with Education officer, museum, nursing staff and therapy department. Medical justification for home health services verified.     [facility], NP [employer] [phone]

## 2014-12-11 DIAGNOSIS — Z471 Aftercare following joint replacement surgery: Secondary | ICD-10-CM | POA: Diagnosis not present

## 2014-12-11 DIAGNOSIS — M179 Osteoarthritis of knee, unspecified: Secondary | ICD-10-CM | POA: Diagnosis not present

## 2014-12-14 DIAGNOSIS — M179 Osteoarthritis of knee, unspecified: Secondary | ICD-10-CM | POA: Diagnosis not present

## 2014-12-14 DIAGNOSIS — Z471 Aftercare following joint replacement surgery: Secondary | ICD-10-CM | POA: Diagnosis not present

## 2014-12-16 DIAGNOSIS — Z471 Aftercare following joint replacement surgery: Secondary | ICD-10-CM | POA: Diagnosis not present

## 2014-12-16 DIAGNOSIS — M179 Osteoarthritis of knee, unspecified: Secondary | ICD-10-CM | POA: Diagnosis not present

## 2014-12-17 DIAGNOSIS — M179 Osteoarthritis of knee, unspecified: Secondary | ICD-10-CM | POA: Diagnosis not present

## 2014-12-17 DIAGNOSIS — Z471 Aftercare following joint replacement surgery: Secondary | ICD-10-CM | POA: Diagnosis not present

## 2014-12-19 DIAGNOSIS — Z1389 Encounter for screening for other disorder: Secondary | ICD-10-CM | POA: Diagnosis not present

## 2014-12-19 DIAGNOSIS — Z0000 Encounter for general adult medical examination without abnormal findings: Secondary | ICD-10-CM | POA: Diagnosis not present

## 2014-12-19 DIAGNOSIS — M179 Osteoarthritis of knee, unspecified: Secondary | ICD-10-CM | POA: Diagnosis not present

## 2014-12-19 DIAGNOSIS — E7800 Pure hypercholesterolemia, unspecified: Secondary | ICD-10-CM | POA: Diagnosis not present

## 2014-12-19 DIAGNOSIS — N183 Chronic kidney disease, stage 3 (moderate): Secondary | ICD-10-CM | POA: Diagnosis not present

## 2014-12-19 DIAGNOSIS — D649 Anemia, unspecified: Secondary | ICD-10-CM | POA: Diagnosis not present

## 2014-12-19 DIAGNOSIS — Z471 Aftercare following joint replacement surgery: Secondary | ICD-10-CM | POA: Diagnosis not present

## 2014-12-19 DIAGNOSIS — Z6841 Body Mass Index (BMI) 40.0 and over, adult: Secondary | ICD-10-CM | POA: Diagnosis not present

## 2014-12-19 DIAGNOSIS — Z23 Encounter for immunization: Secondary | ICD-10-CM | POA: Diagnosis not present

## 2014-12-19 DIAGNOSIS — Z7189 Other specified counseling: Secondary | ICD-10-CM | POA: Diagnosis not present

## 2014-12-20 DIAGNOSIS — M179 Osteoarthritis of knee, unspecified: Secondary | ICD-10-CM | POA: Diagnosis not present

## 2014-12-20 DIAGNOSIS — Z471 Aftercare following joint replacement surgery: Secondary | ICD-10-CM | POA: Diagnosis not present

## 2014-12-23 DIAGNOSIS — Z471 Aftercare following joint replacement surgery: Secondary | ICD-10-CM | POA: Diagnosis not present

## 2014-12-23 DIAGNOSIS — M179 Osteoarthritis of knee, unspecified: Secondary | ICD-10-CM | POA: Diagnosis not present

## 2014-12-24 DIAGNOSIS — Z471 Aftercare following joint replacement surgery: Secondary | ICD-10-CM | POA: Diagnosis not present

## 2014-12-24 DIAGNOSIS — M179 Osteoarthritis of knee, unspecified: Secondary | ICD-10-CM | POA: Diagnosis not present

## 2014-12-27 DIAGNOSIS — Z471 Aftercare following joint replacement surgery: Secondary | ICD-10-CM | POA: Diagnosis not present

## 2014-12-27 DIAGNOSIS — M179 Osteoarthritis of knee, unspecified: Secondary | ICD-10-CM | POA: Diagnosis not present

## 2015-01-01 DIAGNOSIS — Z471 Aftercare following joint replacement surgery: Secondary | ICD-10-CM | POA: Diagnosis not present

## 2015-01-01 DIAGNOSIS — M179 Osteoarthritis of knee, unspecified: Secondary | ICD-10-CM | POA: Diagnosis not present

## 2015-01-07 DIAGNOSIS — D692 Other nonthrombocytopenic purpura: Secondary | ICD-10-CM | POA: Diagnosis not present

## 2015-01-07 DIAGNOSIS — D485 Neoplasm of uncertain behavior of skin: Secondary | ICD-10-CM | POA: Diagnosis not present

## 2015-01-07 DIAGNOSIS — I8312 Varicose veins of left lower extremity with inflammation: Secondary | ICD-10-CM | POA: Diagnosis not present

## 2015-01-07 DIAGNOSIS — I8311 Varicose veins of right lower extremity with inflammation: Secondary | ICD-10-CM | POA: Diagnosis not present

## 2015-01-07 DIAGNOSIS — L570 Actinic keratosis: Secondary | ICD-10-CM | POA: Diagnosis not present

## 2015-01-07 DIAGNOSIS — I872 Venous insufficiency (chronic) (peripheral): Secondary | ICD-10-CM | POA: Diagnosis not present

## 2015-01-07 DIAGNOSIS — Z85828 Personal history of other malignant neoplasm of skin: Secondary | ICD-10-CM | POA: Diagnosis not present

## 2015-01-07 DIAGNOSIS — D1801 Hemangioma of skin and subcutaneous tissue: Secondary | ICD-10-CM | POA: Diagnosis not present

## 2015-01-07 DIAGNOSIS — L821 Other seborrheic keratosis: Secondary | ICD-10-CM | POA: Diagnosis not present

## 2015-01-08 DIAGNOSIS — H2513 Age-related nuclear cataract, bilateral: Secondary | ICD-10-CM | POA: Diagnosis not present

## 2015-01-09 DIAGNOSIS — Z471 Aftercare following joint replacement surgery: Secondary | ICD-10-CM | POA: Diagnosis not present

## 2015-01-09 DIAGNOSIS — M179 Osteoarthritis of knee, unspecified: Secondary | ICD-10-CM | POA: Diagnosis not present

## 2015-02-17 DIAGNOSIS — H6123 Impacted cerumen, bilateral: Secondary | ICD-10-CM | POA: Diagnosis not present

## 2015-02-17 DIAGNOSIS — H906 Mixed conductive and sensorineural hearing loss, bilateral: Secondary | ICD-10-CM | POA: Diagnosis not present

## 2015-02-28 DIAGNOSIS — M1712 Unilateral primary osteoarthritis, left knee: Secondary | ICD-10-CM | POA: Diagnosis not present

## 2015-03-12 DIAGNOSIS — M1712 Unilateral primary osteoarthritis, left knee: Secondary | ICD-10-CM | POA: Diagnosis not present

## 2015-03-19 DIAGNOSIS — M1712 Unilateral primary osteoarthritis, left knee: Secondary | ICD-10-CM | POA: Diagnosis not present

## 2015-03-27 DIAGNOSIS — M1712 Unilateral primary osteoarthritis, left knee: Secondary | ICD-10-CM | POA: Diagnosis not present

## 2015-07-01 DIAGNOSIS — Z96641 Presence of right artificial hip joint: Secondary | ICD-10-CM | POA: Diagnosis not present

## 2015-07-01 DIAGNOSIS — M1611 Unilateral primary osteoarthritis, right hip: Secondary | ICD-10-CM | POA: Diagnosis not present

## 2015-07-09 DIAGNOSIS — H2513 Age-related nuclear cataract, bilateral: Secondary | ICD-10-CM | POA: Diagnosis not present

## 2015-09-16 DIAGNOSIS — Z23 Encounter for immunization: Secondary | ICD-10-CM | POA: Diagnosis not present

## 2015-09-23 DIAGNOSIS — C61 Malignant neoplasm of prostate: Secondary | ICD-10-CM | POA: Diagnosis not present

## 2015-10-23 DIAGNOSIS — N3000 Acute cystitis without hematuria: Secondary | ICD-10-CM | POA: Diagnosis not present

## 2015-10-23 DIAGNOSIS — C61 Malignant neoplasm of prostate: Secondary | ICD-10-CM | POA: Diagnosis not present

## 2015-10-23 DIAGNOSIS — L309 Dermatitis, unspecified: Secondary | ICD-10-CM | POA: Insufficient documentation

## 2015-12-01 DIAGNOSIS — I872 Venous insufficiency (chronic) (peripheral): Secondary | ICD-10-CM | POA: Diagnosis not present

## 2015-12-01 DIAGNOSIS — Z85828 Personal history of other malignant neoplasm of skin: Secondary | ICD-10-CM | POA: Diagnosis not present

## 2015-12-01 DIAGNOSIS — D692 Other nonthrombocytopenic purpura: Secondary | ICD-10-CM | POA: Diagnosis not present

## 2015-12-01 DIAGNOSIS — I8311 Varicose veins of right lower extremity with inflammation: Secondary | ICD-10-CM | POA: Diagnosis not present

## 2015-12-01 DIAGNOSIS — L821 Other seborrheic keratosis: Secondary | ICD-10-CM | POA: Diagnosis not present

## 2015-12-01 DIAGNOSIS — I8312 Varicose veins of left lower extremity with inflammation: Secondary | ICD-10-CM | POA: Diagnosis not present

## 2015-12-01 DIAGNOSIS — L570 Actinic keratosis: Secondary | ICD-10-CM | POA: Diagnosis not present

## 2015-12-01 DIAGNOSIS — D1801 Hemangioma of skin and subcutaneous tissue: Secondary | ICD-10-CM | POA: Diagnosis not present

## 2016-01-06 IMAGING — MR MR LUMBAR SPINE W/O CM
4 of 5 series · 26 of 48 positions shown · non-contrast
Comparison: CT abdomen and pelvis 08/25/2010.

CLINICAL DATA: Low back pain radiating into the right hip and leg
for 4 months. History of prostate cancer.

EXAM:
MRI LUMBAR SPINE WITHOUT CONTRAST
TECHNIQUE: Multiplanar, multisequence MR imaging of the lumbar spine was
performed. No intravenous contrast was administered.

[Series 3: T2 · sagittal · 4.0mm · 0.55mm/px · 6 of 14 slices shown (1 of 2)]
[im 1/14]
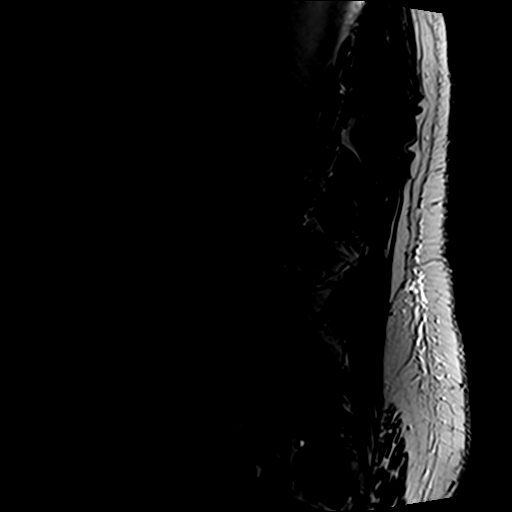
[im 3/14]
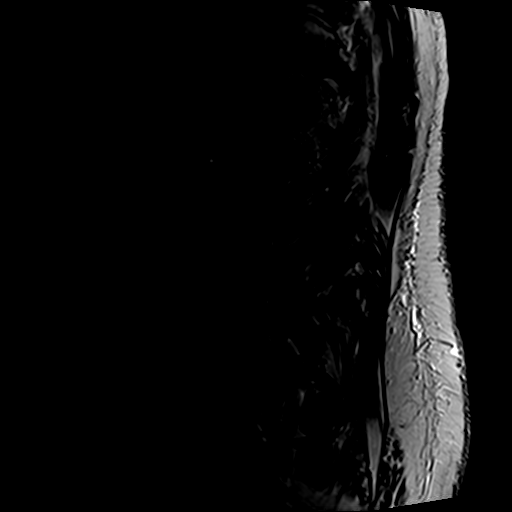
[im 6/14]
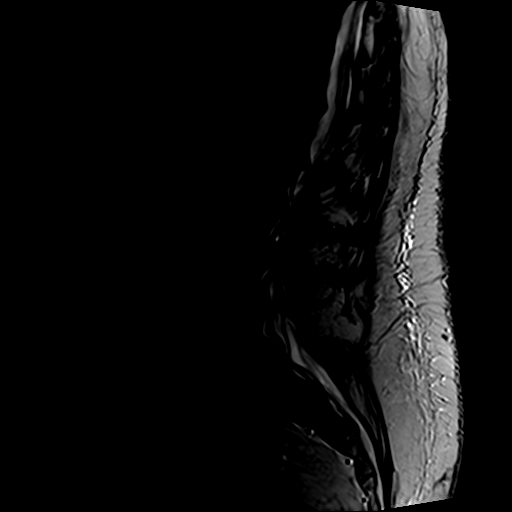
[im 8/14]
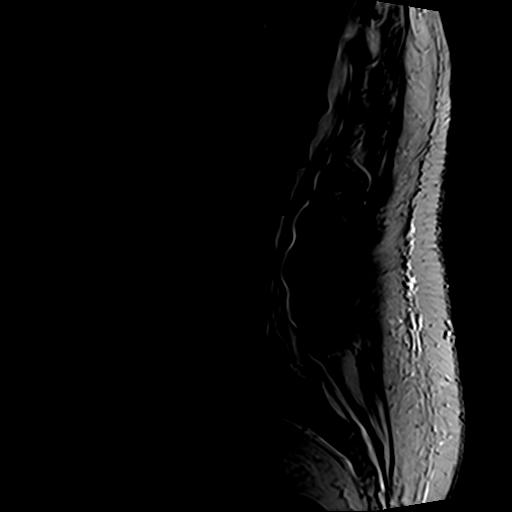
[im 11/14]
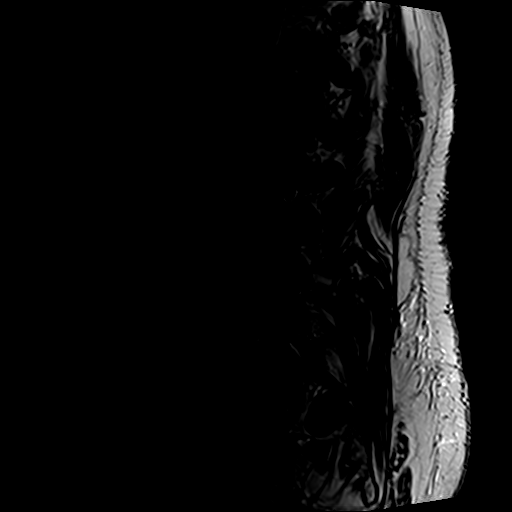
[im 14/14]
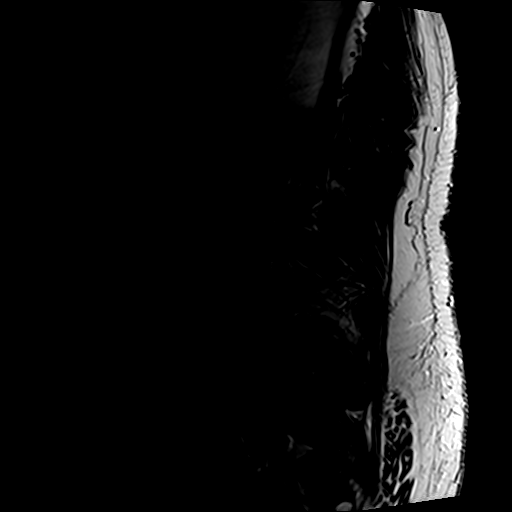

[Series 4: T1 · sagittal · 4.0mm · 0.55mm/px · 6 of 14 slices shown (1 of 2)]
[im 1/14]
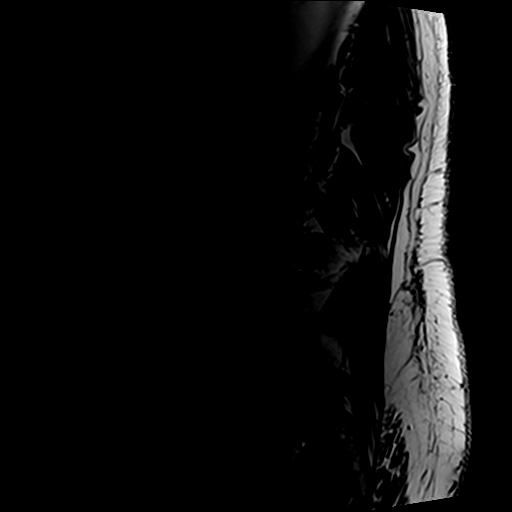
[im 3/14]
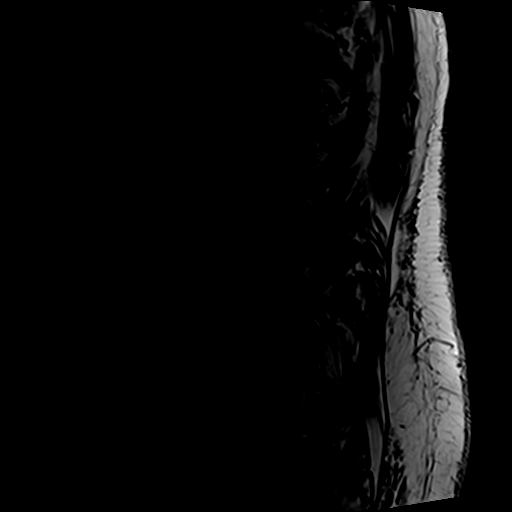
[im 6/14]
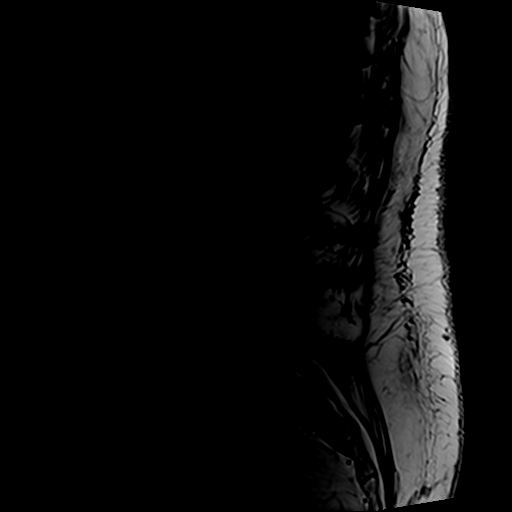
[im 8/14]
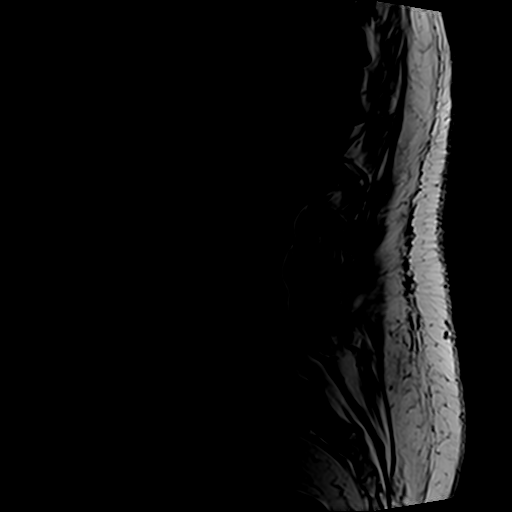
[im 11/14]
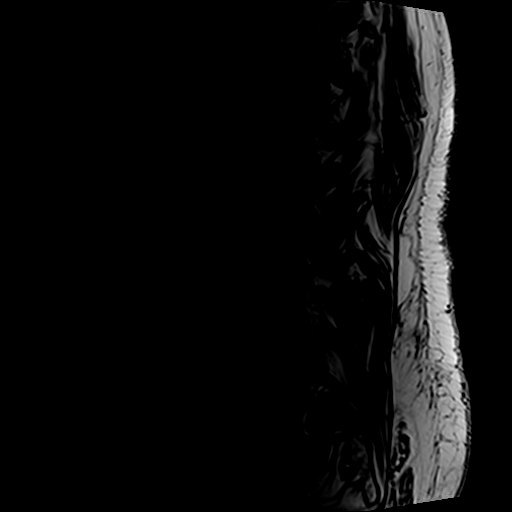
[im 14/14]
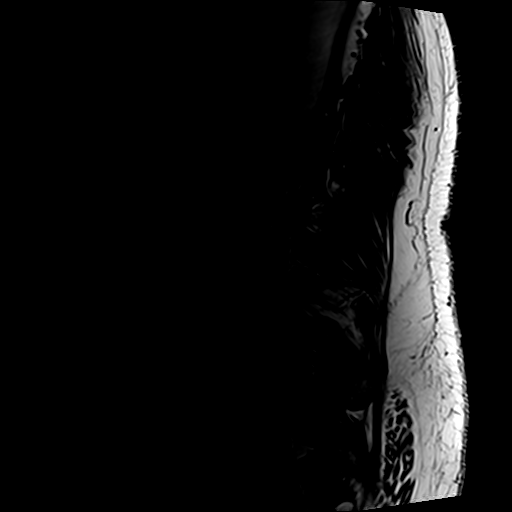

[Series 6: T2 · axial · 4.0mm · 0.70mm/px · z∈[-51,+146]mm · 9 of 38 slices shown (2 of 2)]
[im 1/38]
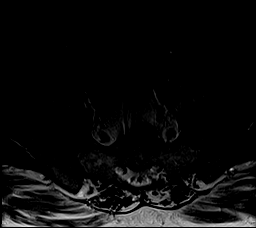
[im 6/38]
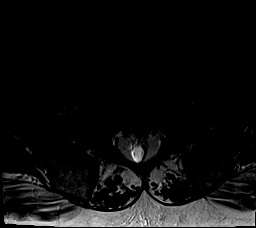
[im 11/38]
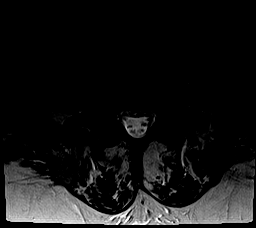
[im 16/38]
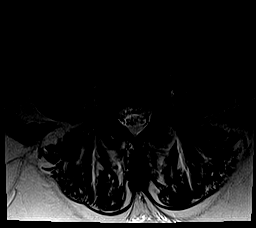
[im 19/38]
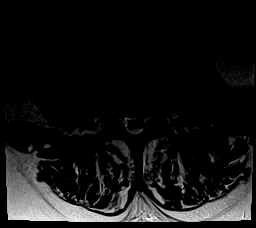
[im 22/38]
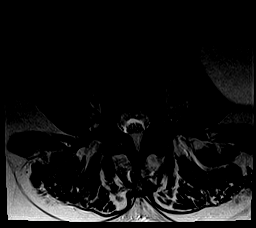
[im 27/38]
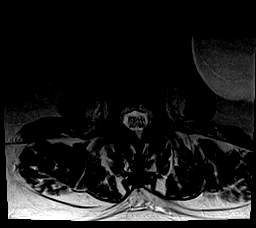
[im 32/38]
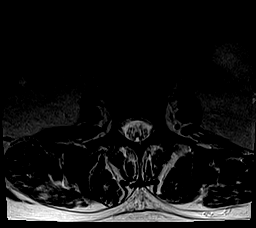
[im 38/38]
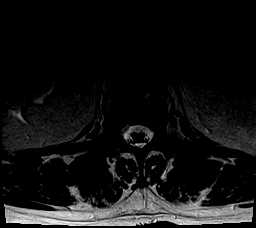

[Series 7: T1 · axial · 4.0mm · 0.35mm/px · z∈[-51,+115]mm · 5 of 38 slices shown (2 of 2)]
[im 1/38]
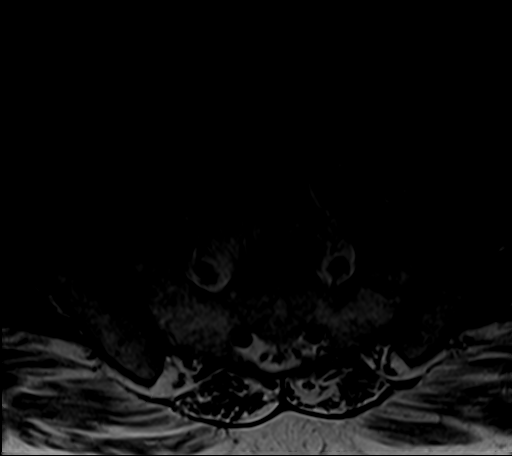
[im 6/38]
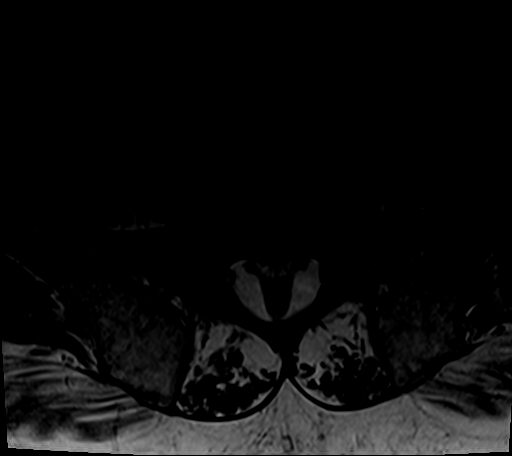
[im 11/38]
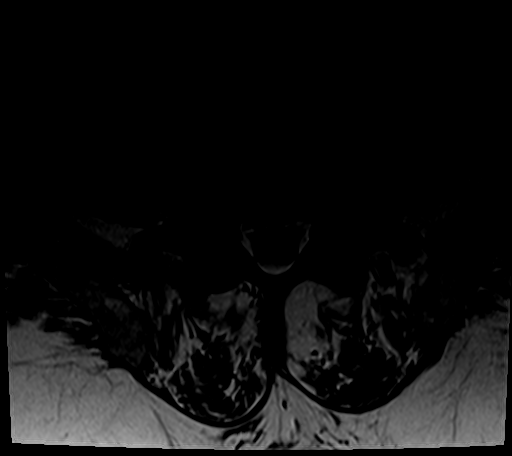
[im 19/38]
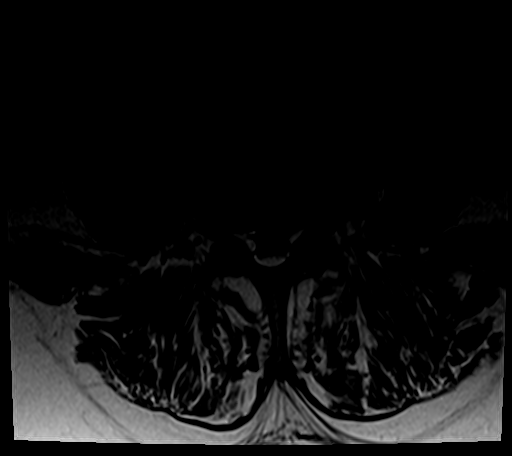
[im 32/38]
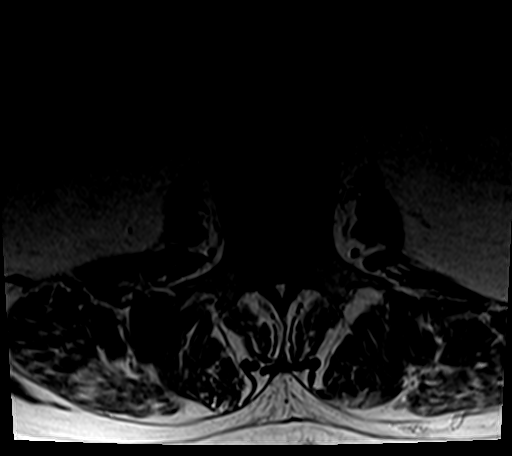

[26 of 48 positions shown; findings below may reference images not displayed]

FINDINGS: Vertebral body height is maintained. Small Schmorl's node in the
inferior endplate of L2 is noted. Scattered degenerative endplate
signal change is most notable at L2-3 but there is no worrisome
marrow lesion. Trace anterolisthesis L5 on S1 due to facet
arthropathy is identified. Epidural fat is prominent throughout. The
conus medullaris is normal in signal and position. Imaged
intra-abdominal contents demonstrate a left renal cyst which is
unchanged in appearance.

The T11-12 level is imaged in the sagittal plane only and negative.

T12-L1: Shallow disc bulge without central canal or foraminal
narrowing.

L1-2: Shallow disc bulge without central canal or foraminal
narrowing.

L2-3: Shallow disc bulge without central canal or foraminal
narrowing.

L3-4: Shallow disc bulge without central canal narrowing. Mild
foraminal narrowing is identified bilaterally, more notable on the
right.

L4-5: There is a shallow disc bulge. The central canal and foramina
are widely patent.

L5-S1: Facet degenerative change is seen with associated small facet
joint effusions. The disc is uncovered. The central canal and
foramina remain open.
IMPRESSION: Multilevel disc bulging without central canal stenosis. Mild
bilateral foraminal narrowing at L3-4 is more notable on the right.
No nerve root compression is identified.

Negative for metastatic disease.

## 2016-01-07 DIAGNOSIS — H1859 Other hereditary corneal dystrophies: Secondary | ICD-10-CM | POA: Diagnosis not present

## 2016-01-16 ENCOUNTER — Ambulatory Visit (INDEPENDENT_AMBULATORY_CARE_PROVIDER_SITE_OTHER): Payer: Medicare Other | Admitting: Orthopaedic Surgery

## 2016-01-16 ENCOUNTER — Ambulatory Visit: Payer: Medicare Other | Admitting: Rheumatology

## 2016-01-16 ENCOUNTER — Encounter (INDEPENDENT_AMBULATORY_CARE_PROVIDER_SITE_OTHER): Payer: Self-pay | Admitting: Orthopaedic Surgery

## 2016-01-16 VITALS — Ht 67.0 in | Wt 250.0 lb

## 2016-01-16 DIAGNOSIS — Z96641 Presence of right artificial hip joint: Secondary | ICD-10-CM

## 2016-01-16 NOTE — Progress Notes (Signed)
Office Visit Note   Patient: Christopher [name]           Date of Birth: 1928/03/10           MRN: [id number] Visit Date: 01/16/2016              Requested by: [name], MD [address] [place], [place] [address] PCP: Mathews Argyle, MD   Assessment & Plan: Visit Diagnoses: Over 1 years status post right total hip replacement and doing very well. No complaints of pain or limitation of activities.  Plan: Encourage home exercises. Watch weight. Return as needed  Follow-Up Instructions: No Follow-up on file.   Orders:  No orders of the defined types were placed in this encounter.  No orders of the defined types were placed in this encounter.     Procedures: No procedures performed   Clinical Data: No additional findings.   Subjective: No chief complaint on file.   Pt here for 1 year follow up of Right THA.   [name] is status post right hip replacement in November 2016. He is extremely happy with his postoperative course with complete resolution of his pre-existing pain. He walks without a limp. Sleeps without difficulty and is able to function without a problem over the course of the day.  Review of Systems   Objective: Vital Signs: There were no vitals taken for this visit.  Physical Exam  Ortho Exam straight leg raise negative bilaterally. His range of motion of right hip with internal and external rotation as well as flexion and extension. Prior right total knee replacement without effusion or instability .Good pulses distally sensory exam is intact. Pelvis appears to be level.  Specialty Comments:  No specialty comments available.  Imaging: No results found.   PMFS History: Patient Active Problem List   Diagnosis Date Noted  . Primary osteoarthritis of right hip 11/26/2014  . Severe obesity (BMI >= 40) ([place]) 11/26/2014  . S/P total hip arthroplasty 11/26/2014  . Varicose veins of lower extremities with other complications  [id number]   Past Medical History:  Diagnosis Date  . Bruises easily    both hands  . Cancer [facility]) 2005   prostate cancer treated with external beam radiation and radioactive seeds  . Cataracts, bilateral    immature  . Chronic kidney disease   . Constipation    takes Colace daily as needed  . Decreased hearing   . Diverticulitis 2003  . DJD (degenerative joint disease)    thumbs and knees  . History of colon polyps    benign  . History of gout   . Joint pain   . Joint swelling   . Leg cramps    no meds  . Peripheral edema    takes Maxzide daily  . Pneumonia    hx of-15+yrs ago  . Ringing in ears   . UTI (lower urinary tract infection)    just completed antibiotic on 11/14/14    Family History  Problem Relation Age of Onset  . Kidney disease Mother     Past Surgical History:  Procedure Laterality Date  . APPENDECTOMY  1941  . COLONOSCOPY    . ENDOVENOUS ABLATION SAPHENOUS VEIN W/ LASER  12-01-2011   left greater saphenous vein by Curt Jews MD   . ENDOVENOUS ABLATION SAPHENOUS VEIN W/ LASER  12-22-2011   right greater saphenous vein    by Curt Jews MD  . [place]  . PARTIAL  COLECTOMY    . prostate seeds     . removal of chest wall tumor    . sigmoid colon resection    . TOTAL HIP ARTHROPLASTY Right 11/26/2014   Procedure: TOTAL HIP ARTHROPLASTY;  Surgeon: Garald Balding, MD;  Location: [place];  Service: Orthopedics;  Laterality: Right;  . TOTAL KNEE ARTHROPLASTY  08-2010   Social History   Occupational History  . Not on file.   Social History Main Topics  . Smoking status: Former Smoker    Packs/day: 1.00    Years: 12.00    Types: Cigarettes  . Smokeless tobacco: Never Used     Comment: quick smoking 81yrs ago  . Alcohol use No  . Drug use: No  . Sexual activity: Not on file

## 2016-03-26 DIAGNOSIS — Z6841 Body Mass Index (BMI) 40.0 and over, adult: Secondary | ICD-10-CM | POA: Diagnosis not present

## 2016-03-26 DIAGNOSIS — N183 Chronic kidney disease, stage 3 (moderate): Secondary | ICD-10-CM | POA: Diagnosis not present

## 2016-03-26 DIAGNOSIS — I872 Venous insufficiency (chronic) (peripheral): Secondary | ICD-10-CM | POA: Diagnosis not present

## 2016-03-26 DIAGNOSIS — Z79899 Other long term (current) drug therapy: Secondary | ICD-10-CM | POA: Diagnosis not present

## 2016-04-01 DIAGNOSIS — Z79899 Other long term (current) drug therapy: Secondary | ICD-10-CM | POA: Diagnosis not present

## 2016-05-27 DIAGNOSIS — Z85828 Personal history of other malignant neoplasm of skin: Secondary | ICD-10-CM | POA: Diagnosis not present

## 2016-05-27 DIAGNOSIS — D692 Other nonthrombocytopenic purpura: Secondary | ICD-10-CM | POA: Diagnosis not present

## 2016-05-27 DIAGNOSIS — D1801 Hemangioma of skin and subcutaneous tissue: Secondary | ICD-10-CM | POA: Diagnosis not present

## 2016-05-27 DIAGNOSIS — L570 Actinic keratosis: Secondary | ICD-10-CM | POA: Diagnosis not present

## 2016-07-05 ENCOUNTER — Other Ambulatory Visit: Payer: Self-pay | Admitting: Ophthalmology

## 2016-07-05 DIAGNOSIS — H532 Diplopia: Secondary | ICD-10-CM

## 2016-07-05 DIAGNOSIS — H5712 Ocular pain, left eye: Secondary | ICD-10-CM

## 2016-07-05 DIAGNOSIS — H2513 Age-related nuclear cataract, bilateral: Secondary | ICD-10-CM | POA: Diagnosis not present

## 2016-07-08 ENCOUNTER — Ambulatory Visit
Admission: RE | Admit: 2016-07-08 | Discharge: 2016-07-08 | Disposition: A | Discharge status: Home / Self Care | Payer: Medicare Other | Source: Ambulatory Visit | Attending: Ophthalmology | Admitting: Ophthalmology

## 2016-07-08 DIAGNOSIS — H532 Diplopia: Secondary | ICD-10-CM | POA: Diagnosis not present

## 2016-07-08 DIAGNOSIS — G319 Degenerative disease of nervous system, unspecified: Secondary | ICD-10-CM | POA: Insufficient documentation

## 2016-07-08 DIAGNOSIS — H2513 Age-related nuclear cataract, bilateral: Secondary | ICD-10-CM | POA: Insufficient documentation

## 2016-07-08 DIAGNOSIS — I6782 Cerebral ischemia: Secondary | ICD-10-CM | POA: Diagnosis not present

## 2016-07-08 DIAGNOSIS — H5712 Ocular pain, left eye: Secondary | ICD-10-CM | POA: Diagnosis not present

## 2016-07-08 LAB — POCT I-STAT CREATININE: CREATININE: 1.6 mg/dL — AB (ref 0.61–1.24)

## 2016-07-08 MED ORDER — GADOBENATE DIMEGLUMINE 529 MG/ML IV SOLN
10.0000 mL | Freq: Once | INTRAVENOUS | Status: AC | PRN
  Administered 2016-07-08: 10 mL via INTRAVENOUS

## 2016-07-27 DIAGNOSIS — J320 Chronic maxillary sinusitis: Secondary | ICD-10-CM | POA: Diagnosis not present

## 2016-07-27 DIAGNOSIS — I872 Venous insufficiency (chronic) (peripheral): Secondary | ICD-10-CM | POA: Diagnosis not present

## 2016-07-27 DIAGNOSIS — N183 Chronic kidney disease, stage 3 (moderate): Secondary | ICD-10-CM | POA: Diagnosis not present

## 2016-08-05 DIAGNOSIS — H903 Sensorineural hearing loss, bilateral: Secondary | ICD-10-CM | POA: Diagnosis not present

## 2016-08-05 DIAGNOSIS — H833X3 Noise effects on inner ear, bilateral: Secondary | ICD-10-CM | POA: Diagnosis not present

## 2016-08-05 DIAGNOSIS — J320 Chronic maxillary sinusitis: Secondary | ICD-10-CM | POA: Diagnosis not present

## 2016-08-05 DIAGNOSIS — H9113 Presbycusis, bilateral: Secondary | ICD-10-CM | POA: Insufficient documentation

## 2016-08-09 DIAGNOSIS — T63481A Toxic effect of venom of other arthropod, accidental (unintentional), initial encounter: Secondary | ICD-10-CM | POA: Diagnosis not present

## 2016-09-28 DIAGNOSIS — Z23 Encounter for immunization: Secondary | ICD-10-CM | POA: Diagnosis not present

## 2016-10-28 DIAGNOSIS — N183 Chronic kidney disease, stage 3 (moderate): Secondary | ICD-10-CM | POA: Diagnosis not present

## 2016-10-28 DIAGNOSIS — C61 Malignant neoplasm of prostate: Secondary | ICD-10-CM | POA: Diagnosis not present

## 2016-11-23 DIAGNOSIS — I872 Venous insufficiency (chronic) (peripheral): Secondary | ICD-10-CM | POA: Diagnosis not present

## 2016-11-23 DIAGNOSIS — Z79899 Other long term (current) drug therapy: Secondary | ICD-10-CM | POA: Diagnosis not present

## 2016-11-23 DIAGNOSIS — Z1331 Encounter for screening for depression: Secondary | ICD-10-CM | POA: Diagnosis not present

## 2016-11-23 DIAGNOSIS — N183 Chronic kidney disease, stage 3 (moderate): Secondary | ICD-10-CM | POA: Diagnosis not present

## 2016-11-23 DIAGNOSIS — M79642 Pain in left hand: Secondary | ICD-10-CM | POA: Diagnosis not present

## 2016-12-01 DIAGNOSIS — D692 Other nonthrombocytopenic purpura: Secondary | ICD-10-CM | POA: Diagnosis not present

## 2016-12-01 DIAGNOSIS — C4442 Squamous cell carcinoma of skin of scalp and neck: Secondary | ICD-10-CM | POA: Diagnosis not present

## 2016-12-01 DIAGNOSIS — D1801 Hemangioma of skin and subcutaneous tissue: Secondary | ICD-10-CM | POA: Diagnosis not present

## 2016-12-01 DIAGNOSIS — L570 Actinic keratosis: Secondary | ICD-10-CM | POA: Diagnosis not present

## 2016-12-01 DIAGNOSIS — Z85828 Personal history of other malignant neoplasm of skin: Secondary | ICD-10-CM | POA: Diagnosis not present

## 2016-12-01 DIAGNOSIS — D485 Neoplasm of uncertain behavior of skin: Secondary | ICD-10-CM | POA: Diagnosis not present

## 2016-12-01 DIAGNOSIS — D044 Carcinoma in situ of skin of scalp and neck: Secondary | ICD-10-CM | POA: Diagnosis not present

## 2016-12-14 DIAGNOSIS — M65341 Trigger finger, right ring finger: Secondary | ICD-10-CM | POA: Insufficient documentation

## 2016-12-14 DIAGNOSIS — Z8546 Personal history of malignant neoplasm of prostate: Secondary | ICD-10-CM | POA: Diagnosis not present

## 2016-12-14 DIAGNOSIS — C61 Malignant neoplasm of prostate: Secondary | ICD-10-CM | POA: Diagnosis not present

## 2016-12-14 DIAGNOSIS — M65342 Trigger finger, left ring finger: Secondary | ICD-10-CM | POA: Diagnosis not present

## 2016-12-14 DIAGNOSIS — Z85038 Personal history of other malignant neoplasm of large intestine: Secondary | ICD-10-CM | POA: Diagnosis not present

## 2016-12-28 DIAGNOSIS — G5602 Carpal tunnel syndrome, left upper limb: Secondary | ICD-10-CM | POA: Diagnosis not present

## 2016-12-28 DIAGNOSIS — G5622 Lesion of ulnar nerve, left upper limb: Secondary | ICD-10-CM | POA: Diagnosis not present

## 2016-12-28 DIAGNOSIS — M65341 Trigger finger, right ring finger: Secondary | ICD-10-CM | POA: Diagnosis not present

## 2016-12-28 DIAGNOSIS — M65342 Trigger finger, left ring finger: Secondary | ICD-10-CM | POA: Diagnosis not present

## 2016-12-29 DIAGNOSIS — H2513 Age-related nuclear cataract, bilateral: Secondary | ICD-10-CM | POA: Diagnosis not present

## 2017-02-22 ENCOUNTER — Other Ambulatory Visit: Payer: Self-pay | Admitting: Geriatric Medicine

## 2017-02-22 ENCOUNTER — Ambulatory Visit
Admission: RE | Admit: 2017-02-22 | Discharge: 2017-02-22 | Disposition: A | Discharge status: Home / Self Care | Payer: Medicare Other | Source: Ambulatory Visit | Attending: Geriatric Medicine | Admitting: Geriatric Medicine

## 2017-02-22 DIAGNOSIS — R0789 Other chest pain: Secondary | ICD-10-CM

## 2017-02-23 ENCOUNTER — Other Ambulatory Visit: Payer: Self-pay | Admitting: Geriatric Medicine

## 2017-02-23 ENCOUNTER — Ambulatory Visit
Admission: RE | Admit: 2017-02-23 | Discharge: 2017-02-23 | Disposition: A | Discharge status: Home / Self Care | Payer: Medicare Other | Source: Ambulatory Visit | Attending: Geriatric Medicine | Admitting: Geriatric Medicine

## 2017-02-23 DIAGNOSIS — I2699 Other pulmonary embolism without acute cor pulmonale: Secondary | ICD-10-CM

## 2017-02-23 DIAGNOSIS — R7989 Other specified abnormal findings of blood chemistry: Secondary | ICD-10-CM | POA: Diagnosis not present

## 2017-02-23 MED ORDER — IOPAMIDOL (ISOVUE-370) INJECTION 76%
40.0000 mL | Freq: Once | INTRAVENOUS | Status: AC | PRN
Start: 2017-02-23 — End: 2017-02-23
  Administered 2017-02-23: 40 mL via INTRAVENOUS

## 2017-02-25 DIAGNOSIS — G5602 Carpal tunnel syndrome, left upper limb: Secondary | ICD-10-CM | POA: Diagnosis not present

## 2017-02-25 DIAGNOSIS — G5622 Lesion of ulnar nerve, left upper limb: Secondary | ICD-10-CM | POA: Diagnosis not present

## 2017-02-25 DIAGNOSIS — M65342 Trigger finger, left ring finger: Secondary | ICD-10-CM | POA: Diagnosis not present

## 2017-03-22 DIAGNOSIS — N183 Chronic kidney disease, stage 3 (moderate): Secondary | ICD-10-CM | POA: Diagnosis not present

## 2017-03-22 DIAGNOSIS — I700 Atherosclerosis of aorta: Secondary | ICD-10-CM | POA: Diagnosis not present

## 2017-03-22 DIAGNOSIS — Z6841 Body Mass Index (BMI) 40.0 and over, adult: Secondary | ICD-10-CM | POA: Diagnosis not present

## 2017-03-22 DIAGNOSIS — I872 Venous insufficiency (chronic) (peripheral): Secondary | ICD-10-CM | POA: Diagnosis not present

## 2017-05-31 DIAGNOSIS — L570 Actinic keratosis: Secondary | ICD-10-CM | POA: Diagnosis not present

## 2017-05-31 DIAGNOSIS — L821 Other seborrheic keratosis: Secondary | ICD-10-CM | POA: Diagnosis not present

## 2017-05-31 DIAGNOSIS — D1801 Hemangioma of skin and subcutaneous tissue: Secondary | ICD-10-CM | POA: Diagnosis not present

## 2017-05-31 DIAGNOSIS — D692 Other nonthrombocytopenic purpura: Secondary | ICD-10-CM | POA: Diagnosis not present

## 2017-05-31 DIAGNOSIS — Z85828 Personal history of other malignant neoplasm of skin: Secondary | ICD-10-CM | POA: Diagnosis not present

## 2017-06-18 DIAGNOSIS — S51812A Laceration without foreign body of left forearm, initial encounter: Secondary | ICD-10-CM | POA: Diagnosis not present

## 2017-06-18 DIAGNOSIS — W19XXXA Unspecified fall, initial encounter: Secondary | ICD-10-CM | POA: Diagnosis not present

## 2017-07-19 DIAGNOSIS — M109 Gout, unspecified: Secondary | ICD-10-CM | POA: Diagnosis not present

## 2017-07-21 ENCOUNTER — Ambulatory Visit (INDEPENDENT_AMBULATORY_CARE_PROVIDER_SITE_OTHER): Payer: Self-pay

## 2017-07-21 ENCOUNTER — Encounter (INDEPENDENT_AMBULATORY_CARE_PROVIDER_SITE_OTHER): Payer: Self-pay | Admitting: Orthopaedic Surgery

## 2017-07-21 ENCOUNTER — Ambulatory Visit (INDEPENDENT_AMBULATORY_CARE_PROVIDER_SITE_OTHER): Payer: Medicare Other | Admitting: Orthopaedic Surgery

## 2017-07-21 VITALS — BP 128/73 | HR 84 | Resp 20 | Ht 68.0 in | Wt 260.0 lb

## 2017-07-21 DIAGNOSIS — M25562 Pain in left knee: Secondary | ICD-10-CM

## 2017-07-21 DIAGNOSIS — M79672 Pain in left foot: Secondary | ICD-10-CM

## 2017-07-21 DIAGNOSIS — G8929 Other chronic pain: Secondary | ICD-10-CM

## 2017-07-21 DIAGNOSIS — M79671 Pain in right foot: Secondary | ICD-10-CM | POA: Diagnosis not present

## 2017-07-21 DIAGNOSIS — S41111A Laceration without foreign body of right upper arm, initial encounter: Secondary | ICD-10-CM | POA: Diagnosis not present

## 2017-07-21 NOTE — Progress Notes (Signed)
Office Visit Note   Patient: Christopher [name]           Date of Birth: March 19, 1928           MRN: [id number] Visit Date: 07/21/2017              Requested by: Christopher Manes, MD [address] [place], [place] [address] PCP: Christopher Manes, MD   Assessment & Plan: Visit Diagnoses:  1. Chronic pain of left knee   2. Pain in both feet     Plan: Venous stasis changes bilateral lower extremities with possible gout left ankle.  Saw Dr. Felipa [name] who placed him on prednisone with significant relief of his pain.  Also has osteoarthritis of his left knee feeling better with the prednisone would not suggest any specific treatment for his knee today but might want to reconsider either cortisone or Visco supplementation at some point in the future  Follow-Up Instructions: Return if symptoms worsen or fail to improve.   Orders:  No orders of the defined types were placed in this encounter.  No orders of the defined types were placed in this encounter.     Procedures: No procedures performed   Clinical Data: No additional findings.   Subjective: Chief Complaint  Patient presents with  . Left Knee - Pain  . Left Ankle - Pain  . Right Ankle - Pain  . Right Foot - Pain  . Left Foot - Pain  . Knee Pain    Left knee pain x years, no swelling, difficulty walking, Euflexxa in past, popping, no surgery to left knee  . Ankle Pain    Left ankle swelling x 10 years since prostate surgery  . Foot Pain    Bil foot pain x 2 days, prednisone Dr. Felipa [name], gout flare?, redness, warm, no injury, not diabetic, vein surgery bil feet  Christopher [name] the office with his sister for evaluation of bilateral foot and ankle pain and left knee pain.  He just recently lost his wife of many years last week.  Days after her death he developed significant pain and swelling of both of his feet.  He saw Dr. Felipa [name] who thought he might have a gout attack in his left ankle.  He is been on prednisone  since Monday with significant relief.  He does have chronic venous stasis changes of both lower extremities greater on the left than the right.  He does have support stockings.  In addition we have seen him in the past for the arthritis of his left knee.  He said cortisone and Visco supplementation.  He has had a recent mild exacerbation knee pain but better since he has been on the prednisone.  No recent injury or trauma.  He also is status post total hip replacement and is very happy with the results HPI  Review of Systems  Constitutional: Positive for activity change and fatigue.  HENT: Negative for trouble swallowing.   Eyes: Negative for pain.  Respiratory: Positive for shortness of breath.   Cardiovascular: Positive for leg swelling.  Gastrointestinal: Negative for constipation.  Endocrine: Negative for cold intolerance.  Genitourinary: Negative for difficulty urinating.  Musculoskeletal: Positive for joint swelling.  Skin: Negative for rash.  Allergic/Immunologic: Positive for food allergies.  Neurological: Negative for weakness.  Hematological: Bruises/bleeds easily.  Psychiatric/Behavioral: Negative for sleep disturbance.     Objective: Vital Signs: BP 128/73 (BP Location: Left Arm, Patient Position: Sitting, Cuff Size: Large)   Pulse 84  Resp 20   Ht 5\' 8"  (1.727 m)   Wt 260 lb (117.9 kg)   BMI 39.53 kg/m   Physical Exam  Constitutional: He is oriented to person, place, and time. He appears well-developed and well-nourished.  HENT:  Mouth/Throat: Oropharynx is clear and moist.  Eyes: Pupils are equal, round, and reactive to light. EOM are normal.  Pulmonary/Chest: Effort normal.  Neurological: He is alert and oriented to person, place, and time.  Skin: Skin is warm and dry.  Psychiatric: He has a normal mood and affect. His behavior is normal.    Ortho Exam awake alert and oriented x3.  Comfortable sitting.  Left ankle is swollen more than the right with minimal  discomfort.  Apparently he was much worse several days ago but the prednisone is "helped".  Mild swelling of his feet.  Significant venous stasis changes more of the left ankle than the right.  No calf pain.  Number of spider veins and varicosities.  History of right total knee replacement doing well.  Left knee with some mild medial lateral joint pain incomplete extension but no effusion.  No significant local tenderness.  Mild patellar crepitation Specialty Comments:  No specialty comments available.  Imaging: No results found.   PMFS History: Patient Active Problem List   Diagnosis Date Noted  . Primary osteoarthritis of right hip 11/26/2014  . Severe obesity (BMI >= 40) ([place]) 11/26/2014  . S/P total hip arthroplasty 11/26/2014  . Varicose veins of lower extremities with other complications 95/09/3265   Past Medical History:  Diagnosis Date  . Bruises easily    both hands  . Cancer [facility]) 2005   prostate cancer treated with external beam radiation and radioactive seeds  . Cataracts, bilateral    immature  . Chronic kidney disease   . Constipation    takes Colace daily as needed  . Decreased hearing   . Diverticulitis 2003  . DJD (degenerative joint disease)    thumbs and knees  . History of colon polyps    benign  . History of gout   . Joint pain   . Joint swelling   . Leg cramps    no meds  . Peripheral edema    takes Maxzide daily  . Pneumonia    hx of-15+yrs ago  . Ringing in ears   . UTI (lower urinary tract infection)    just completed antibiotic on 11/14/14    Family History  Problem Relation Age of Onset  . Kidney disease Mother     Past Surgical History:  Procedure Laterality Date  . APPENDECTOMY  1941  . COLONOSCOPY    . ENDOVENOUS ABLATION SAPHENOUS VEIN W/ LASER  12-01-2011   left greater saphenous vein by Christopher Jews MD   . ENDOVENOUS ABLATION SAPHENOUS VEIN W/ LASER  12-22-2011   right greater saphenous vein    by Christopher Jews MD  . HAND SURGERY      . [place]  . PARTIAL COLECTOMY    . prostate seeds     . removal of chest wall tumor    . sigmoid colon resection    . TOTAL HIP ARTHROPLASTY Right 11/26/2014   Procedure: TOTAL HIP ARTHROPLASTY;  Surgeon: Christopher Balding, MD;  Location: [place];  Service: Orthopedics;  Laterality: Right;  . TOTAL KNEE ARTHROPLASTY  08-2010   Social History   Occupational History  . Not on file  Tobacco Use  . Smoking status: Former Smoker  Packs/day: 1.00    Years: 12.00    Pack years: 12.00    Types: Cigarettes    Last attempt to quit: 07/21/1956    Years since quitting: 61.0  . Smokeless tobacco: Never Used  . Tobacco comment: quick smoking 47yrs ago  Substance and Sexual Activity  . Alcohol use: No  . Drug use: No  . Sexual activity: Not Currently

## 2017-08-09 DIAGNOSIS — J3089 Other allergic rhinitis: Secondary | ICD-10-CM | POA: Diagnosis not present

## 2017-08-09 DIAGNOSIS — I872 Venous insufficiency (chronic) (peripheral): Secondary | ICD-10-CM | POA: Diagnosis not present

## 2017-08-09 DIAGNOSIS — N183 Chronic kidney disease, stage 3 (moderate): Secondary | ICD-10-CM | POA: Diagnosis not present

## 2017-08-09 DIAGNOSIS — Z6841 Body Mass Index (BMI) 40.0 and over, adult: Secondary | ICD-10-CM | POA: Diagnosis not present

## 2017-09-21 DIAGNOSIS — H2513 Age-related nuclear cataract, bilateral: Secondary | ICD-10-CM | POA: Diagnosis not present

## 2017-09-26 DIAGNOSIS — H353131 Nonexudative age-related macular degeneration, bilateral, early dry stage: Secondary | ICD-10-CM | POA: Diagnosis not present

## 2017-09-26 DIAGNOSIS — H2513 Age-related nuclear cataract, bilateral: Secondary | ICD-10-CM | POA: Diagnosis not present

## 2017-09-29 ENCOUNTER — Other Ambulatory Visit: Payer: Self-pay

## 2017-09-29 DIAGNOSIS — H2511 Age-related nuclear cataract, right eye: Secondary | ICD-10-CM | POA: Diagnosis not present

## 2017-09-29 NOTE — Discharge Instructions (Signed)

## 2017-10-05 ENCOUNTER — Encounter
Admission: RE | Disposition: A | Discharge status: Home / Self Care | Payer: Self-pay | Source: Ambulatory Visit | Attending: Ophthalmology

## 2017-10-05 ENCOUNTER — Ambulatory Visit: Payer: Medicare Other | Admitting: Anesthesiology

## 2017-10-05 ENCOUNTER — Ambulatory Visit
Admission: RE | Admit: 2017-10-05 | Discharge: 2017-10-05 | Disposition: A | Discharge status: Home / Self Care | Payer: Medicare Other | Source: Ambulatory Visit | Attending: Ophthalmology | Admitting: Ophthalmology

## 2017-10-05 DIAGNOSIS — Z87891 Personal history of nicotine dependence: Secondary | ICD-10-CM | POA: Diagnosis not present

## 2017-10-05 DIAGNOSIS — Z96641 Presence of right artificial hip joint: Secondary | ICD-10-CM | POA: Insufficient documentation

## 2017-10-05 DIAGNOSIS — Z8546 Personal history of malignant neoplasm of prostate: Secondary | ICD-10-CM | POA: Insufficient documentation

## 2017-10-05 DIAGNOSIS — Z96651 Presence of right artificial knee joint: Secondary | ICD-10-CM | POA: Insufficient documentation

## 2017-10-05 DIAGNOSIS — Z85828 Personal history of other malignant neoplasm of skin: Secondary | ICD-10-CM | POA: Diagnosis not present

## 2017-10-05 DIAGNOSIS — H2511 Age-related nuclear cataract, right eye: Secondary | ICD-10-CM | POA: Diagnosis not present

## 2017-10-05 DIAGNOSIS — Z9049 Acquired absence of other specified parts of digestive tract: Secondary | ICD-10-CM | POA: Insufficient documentation

## 2017-10-05 DIAGNOSIS — Z79899 Other long term (current) drug therapy: Secondary | ICD-10-CM | POA: Insufficient documentation

## 2017-10-05 DIAGNOSIS — H25811 Combined forms of age-related cataract, right eye: Secondary | ICD-10-CM | POA: Diagnosis not present

## 2017-10-05 HISTORY — DX: Other complications of anesthesia, initial encounter: T88.59XA

## 2017-10-05 HISTORY — PX: CATARACT EXTRACTION W/PHACO: SHX586

## 2017-10-05 HISTORY — DX: Adverse effect of unspecified anesthetic, initial encounter: T41.45XA

## 2017-10-05 SURGERY — PHACOEMULSIFICATION, CATARACT, WITH IOL INSERTION
Anesthesia: Monitor Anesthesia Care | Site: Eye | Laterality: Right

## 2017-10-05 MED ORDER — NA HYALUR & NA CHOND-NA HYALUR 0.4-0.35 ML IO KIT
PACK | INTRAOCULAR | Status: DC | PRN
  Administered 2017-10-05: 1 mL via INTRAOCULAR

## 2017-10-05 MED ORDER — BRIMONIDINE TARTRATE-TIMOLOL 0.2-0.5 % OP SOLN
OPHTHALMIC | Status: DC | PRN
  Administered 2017-10-05: 1 [drp] via OPHTHALMIC

## 2017-10-05 MED ORDER — ARMC OPHTHALMIC DILATING DROPS
1.0000 "application " | OPHTHALMIC | Status: DC | PRN
  Administered 2017-10-05 (×3): 1 via OPHTHALMIC

## 2017-10-05 MED ORDER — ONDANSETRON HCL 4 MG/2ML IJ SOLN: 4.0000 mg | Freq: Once | INTRAMUSCULAR | Status: DC | PRN

## 2017-10-05 MED ORDER — MOXIFLOXACIN HCL 0.5 % OP SOLN
OPHTHALMIC | Status: DC | PRN
  Administered 2017-10-05: 0.2 mL via OPHTHALMIC

## 2017-10-05 MED ORDER — LIDOCAINE HCL (PF) 2 % IJ SOLN
INTRAOCULAR | Status: DC | PRN
  Administered 2017-10-05: 1 mL via INTRAMUSCULAR

## 2017-10-05 MED ORDER — MIDAZOLAM HCL 2 MG/2ML IJ SOLN
INTRAMUSCULAR | Status: DC | PRN
  Administered 2017-10-05: 2 mg via INTRAVENOUS

## 2017-10-05 MED ORDER — TETRACAINE HCL 0.5 % OP SOLN
1.0000 [drp] | OPHTHALMIC | Status: DC | PRN
  Administered 2017-10-05 (×2): 1 [drp] via OPHTHALMIC

## 2017-10-05 MED ORDER — LACTATED RINGERS IV SOLN: 10.0000 mL/h | INTRAVENOUS | Status: DC

## 2017-10-05 MED ORDER — FENTANYL CITRATE (PF) 100 MCG/2ML IJ SOLN
INTRAMUSCULAR | Status: DC | PRN
  Administered 2017-10-05: 50 ug via INTRAVENOUS

## 2017-10-05 MED ORDER — MOXIFLOXACIN HCL 0.5 % OP SOLN
1.0000 [drp] | OPHTHALMIC | Status: DC | PRN
  Administered 2017-10-05 (×3): 1 [drp] via OPHTHALMIC

## 2017-10-05 MED ORDER — EPINEPHRINE PF 1 MG/ML IJ SOLN
INTRAOCULAR | Status: DC | PRN
  Administered 2017-10-05: 79 mL via OPHTHALMIC

## 2017-10-05 SURGICAL SUPPLY — 21 items
CANNULA ANT/CHMB 27G (MISCELLANEOUS) ×1 IMPLANT
CANNULA ANT/CHMB 27GA (MISCELLANEOUS) ×3 IMPLANT
GLOVE SURG LX 7.5 STRW (GLOVE) ×2
GLOVE SURG LX STRL 7.5 STRW (GLOVE) ×1 IMPLANT
GLOVE SURG TRIUMPH 8.0 PF LTX (GLOVE) ×3 IMPLANT
GOWN STRL REUS W/ TWL LRG LVL3 (GOWN DISPOSABLE) ×2 IMPLANT
GOWN STRL REUS W/TWL LRG LVL3 (GOWN DISPOSABLE) ×4
LENS IOL ACRSF IQ ULTRA 19.0 (Intraocular Lens) IMPLANT
LENS IOL ACRYSOF IQ 19.0 (Intraocular Lens) ×3 IMPLANT
MARKER SKIN DUAL TIP RULER LAB (MISCELLANEOUS) ×3 IMPLANT
NDL FILTER BLUNT 18X1 1/2 (NEEDLE) ×1 IMPLANT
NEEDLE FILTER BLUNT 18X 1/2SAF (NEEDLE) ×2
NEEDLE FILTER BLUNT 18X1 1/2 (NEEDLE) ×1 IMPLANT
PACK CATARACT BRASINGTON (MISCELLANEOUS) ×3 IMPLANT
PACK EYE AFTER SURG (MISCELLANEOUS) ×3 IMPLANT
PACK OPTHALMIC (MISCELLANEOUS) ×3 IMPLANT
SYR 3ML LL SCALE MARK (SYRINGE) ×3 IMPLANT
SYR 5ML LL (SYRINGE) ×3 IMPLANT
SYR TB 1ML LUER SLIP (SYRINGE) ×3 IMPLANT
WATER STERILE IRR 500ML POUR (IV SOLUTION) ×3 IMPLANT
WIPE NON LINTING 3.25X3.25 (MISCELLANEOUS) ×3 IMPLANT

## 2017-10-05 NOTE — H&P (Signed)
The History and Physical notes are on paper, have been signed, and are to be scanned. The patient remains stable and unchanged from the H&P.   Previous H&P reviewed, patient examined, and there are no changes.  Christopher [name] 10/05/2017 9:35 AM

## 2017-10-05 NOTE — Anesthesia Procedure Notes (Signed)
Procedure Name: MAC Performed by: Azreal Stthomas, CRNA Pre-anesthesia Checklist: Patient identified, Emergency Drugs available, Suction available, Timeout performed and Patient being monitored Patient Re-evaluated:Patient Re-evaluated prior to induction Oxygen Delivery Method: Nasal cannula Placement Confirmation: positive ETCO2       

## 2017-10-05 NOTE — Anesthesia Postprocedure Evaluation (Signed)
Anesthesia Post Note  Patient: Christopher [name]  Procedure(s) Performed: CATARACT EXTRACTION PHACO AND INTRAOCULAR LENS PLACEMENT (IOC) RIGHT (Right Eye)  Patient location during evaluation: PACU Anesthesia Type: MAC Level of consciousness: awake and alert Pain management: pain level controlled Vital Signs Assessment: post-procedure vital signs reviewed and stable Respiratory status: spontaneous breathing, nonlabored ventilation, respiratory function stable and patient connected to nasal cannula oxygen Cardiovascular status: stable and blood pressure returned to baseline Postop Assessment: no apparent nausea or vomiting Anesthetic complications: no    Arcelia Pals C

## 2017-10-05 NOTE — Transfer of Care (Signed)
Immediate Anesthesia Transfer of Care Note  Patient: Christopher [name]  Procedure(s) Performed: CATARACT EXTRACTION PHACO AND INTRAOCULAR LENS PLACEMENT (IOC) RIGHT (Right Eye)  Patient Location: PACU  Anesthesia Type: MAC  Level of Consciousness: awake, alert  and patient cooperative  Airway and Oxygen Therapy: Patient Spontanous Breathing and Patient connected to supplemental oxygen  Post-op Assessment: Post-op Vital signs reviewed, Patient's Cardiovascular Status Stable, Respiratory Function Stable, Patent Airway and No signs of Nausea or vomiting  Post-op Vital Signs: Reviewed and stable  Complications: No apparent anesthesia complications

## 2017-10-05 NOTE — Op Note (Signed)
LOCATION:  [place]   PREOPERATIVE DIAGNOSIS:    Nuclear sclerotic cataract right eye. H25.11   POSTOPERATIVE DIAGNOSIS:  Nuclear sclerotic cataract right eye.     PROCEDURE:  Phacoemusification with posterior chamber intraocular lens placement of the right eye   LENS:   Implant Name Type Inv. Item Serial No. Manufacturer Lot No. LRB No. Used  LENS IOL ACRYSOF IQ 19.0 - [id number] Intraocular Lens LENS IOL ACRYSOF IQ 19.0 [id number] ALCON  Right 1        ULTRASOUND TIME: 20 % of 1 minutes, 45 seconds.  CDE 21.0   SURGEON:  Wyonia Hough, MD   ANESTHESIA:  Topical with tetracaine drops and 2% Xylocaine jelly, augmented with 1% preservative-free intracameral lidocaine.    COMPLICATIONS:  None.   DESCRIPTION OF PROCEDURE:  The patient was identified in the holding room and transported to the operating room and placed in the supine position under the operating microscope.  The right eye was identified as the operative eye and it was prepped and draped in the usual sterile ophthalmic fashion.   A 1 millimeter clear-corneal paracentesis was made at the 12:00 position.  0.5 ml of preservative-free 1% lidocaine was injected into the anterior chamber. The anterior chamber was filled with Viscoat viscoelastic.  A 2.4 millimeter keratome was used to make a near-clear corneal incision at the 9:00 position.  A curvilinear capsulorrhexis was made with a cystotome and capsulorrhexis forceps.  Balanced salt solution was used to hydrodissect and hydrodelineate the nucleus.   Phacoemulsification was then used in stop and chop fashion to remove the lens nucleus and epinucleus.  The remaining cortex was then removed using the irrigation and aspiration handpiece. Provisc was then placed into the capsular bag to distend it for lens placement.  A lens was then injected into the capsular bag.  The remaining viscoelastic was aspirated.   Wounds were hydrated with balanced salt solution.   The anterior chamber was inflated to a physiologic pressure with balanced salt solution.  No wound leaks were noted. Vigamox 0.2 ml of a 1mg  per ml solution was injected into the anterior chamber for a dose of 0.2 mg of intracameral antibiotic at the completion of the case.   Timolol and Brimonidine drops were applied to the eye.  The patient was taken to the recovery room in stable condition without complications of anesthesia or surgery.   Christopher [name] 10/05/2017, 10:19 AM

## 2017-10-05 NOTE — Anesthesia Preprocedure Evaluation (Signed)
Anesthesia Evaluation    Airway Mallampati: II  TM Distance: >3 FB Neck ROM: Full    Dental no notable dental hx.    Pulmonary neg pulmonary ROS, former smoker,    Pulmonary exam normal breath sounds clear to auscultation       Cardiovascular negative cardio ROS Normal cardiovascular exam Rhythm:Regular Rate:Normal     Neuro/Psych    GI/Hepatic diverticulitis   Endo/Other    Renal/GU Renal InsufficiencyRenal disease     Musculoskeletal gout   Abdominal   Peds  Hematology   Anesthesia Other Findings   Reproductive/Obstetrics                             Anesthesia Physical Anesthesia Plan  ASA: II  Anesthesia Plan: MAC   Post-op Pain Management:    Induction: Intravenous  PONV Risk Score and Plan: TIVA  Airway Management Planned:   Additional Equipment:   Intra-op Plan:   Post-operative Plan: Extubation in OR  Informed Consent: I have reviewed the patients History and Physical, chart, labs and discussed the procedure including the risks, benefits and alternatives for the proposed anesthesia with the patient or authorized representative who has indicated his/her understanding and acceptance.   Dental advisory given  Plan Discussed with: CRNA  Anesthesia Plan Comments:         Anesthesia Quick Evaluation

## 2017-10-06 ENCOUNTER — Encounter: Payer: Self-pay | Admitting: Ophthalmology

## 2017-10-19 ENCOUNTER — Other Ambulatory Visit: Payer: Self-pay

## 2017-10-19 ENCOUNTER — Encounter: Payer: Self-pay | Admitting: *Deleted

## 2017-10-19 DIAGNOSIS — H2512 Age-related nuclear cataract, left eye: Secondary | ICD-10-CM | POA: Diagnosis not present

## 2017-10-24 DIAGNOSIS — I493 Ventricular premature depolarization: Secondary | ICD-10-CM | POA: Diagnosis not present

## 2017-10-24 DIAGNOSIS — I499 Cardiac arrhythmia, unspecified: Secondary | ICD-10-CM | POA: Diagnosis not present

## 2017-10-24 DIAGNOSIS — Z23 Encounter for immunization: Secondary | ICD-10-CM | POA: Diagnosis not present

## 2017-10-24 DIAGNOSIS — N183 Chronic kidney disease, stage 3 (moderate): Secondary | ICD-10-CM | POA: Diagnosis not present

## 2017-10-24 NOTE — Discharge Instructions (Signed)

## 2017-10-25 DIAGNOSIS — S51812A Laceration without foreign body of left forearm, initial encounter: Secondary | ICD-10-CM | POA: Diagnosis not present

## 2017-10-26 ENCOUNTER — Ambulatory Visit: Payer: Medicare Other | Admitting: Anesthesiology

## 2017-10-26 ENCOUNTER — Encounter
Admission: RE | Disposition: A | Discharge status: Home / Self Care | Payer: Self-pay | Source: Ambulatory Visit | Attending: Ophthalmology

## 2017-10-26 ENCOUNTER — Ambulatory Visit
Admission: RE | Admit: 2017-10-26 | Discharge: 2017-10-26 | Disposition: A | Discharge status: Home / Self Care | Payer: Medicare Other | Source: Ambulatory Visit | Attending: Ophthalmology | Admitting: Ophthalmology

## 2017-10-26 DIAGNOSIS — Z79899 Other long term (current) drug therapy: Secondary | ICD-10-CM | POA: Diagnosis not present

## 2017-10-26 DIAGNOSIS — H25812 Combined forms of age-related cataract, left eye: Secondary | ICD-10-CM | POA: Diagnosis not present

## 2017-10-26 DIAGNOSIS — Z8546 Personal history of malignant neoplasm of prostate: Secondary | ICD-10-CM | POA: Diagnosis not present

## 2017-10-26 DIAGNOSIS — Z96651 Presence of right artificial knee joint: Secondary | ICD-10-CM | POA: Insufficient documentation

## 2017-10-26 DIAGNOSIS — Z87891 Personal history of nicotine dependence: Secondary | ICD-10-CM | POA: Diagnosis not present

## 2017-10-26 DIAGNOSIS — I10 Essential (primary) hypertension: Secondary | ICD-10-CM | POA: Insufficient documentation

## 2017-10-26 DIAGNOSIS — Z96641 Presence of right artificial hip joint: Secondary | ICD-10-CM | POA: Insufficient documentation

## 2017-10-26 DIAGNOSIS — Z880 Allergy status to penicillin: Secondary | ICD-10-CM | POA: Insufficient documentation

## 2017-10-26 DIAGNOSIS — H2512 Age-related nuclear cataract, left eye: Secondary | ICD-10-CM | POA: Diagnosis not present

## 2017-10-26 DIAGNOSIS — Z85828 Personal history of other malignant neoplasm of skin: Secondary | ICD-10-CM | POA: Diagnosis not present

## 2017-10-26 HISTORY — PX: CATARACT EXTRACTION W/PHACO: SHX586

## 2017-10-26 SURGERY — PHACOEMULSIFICATION, CATARACT, WITH IOL INSERTION
Anesthesia: Monitor Anesthesia Care | Site: Eye | Laterality: Left

## 2017-10-26 MED ORDER — BRIMONIDINE TARTRATE-TIMOLOL 0.2-0.5 % OP SOLN
OPHTHALMIC | Status: DC | PRN
  Administered 2017-10-26: 1 [drp] via OPHTHALMIC

## 2017-10-26 MED ORDER — FENTANYL CITRATE (PF) 100 MCG/2ML IJ SOLN
INTRAMUSCULAR | Status: DC | PRN
  Administered 2017-10-26: 50 ug via INTRAVENOUS

## 2017-10-26 MED ORDER — LACTATED RINGERS IV SOLN: 10.0000 mL/h | INTRAVENOUS | Status: DC

## 2017-10-26 MED ORDER — EPINEPHRINE PF 1 MG/ML IJ SOLN
INTRAOCULAR | Status: DC | PRN
  Administered 2017-10-26: 85 mL via OPHTHALMIC

## 2017-10-26 MED ORDER — MOXIFLOXACIN HCL 0.5 % OP SOLN
1.0000 [drp] | OPHTHALMIC | Status: DC | PRN
  Administered 2017-10-26 (×3): 1 [drp] via OPHTHALMIC

## 2017-10-26 MED ORDER — MIDAZOLAM HCL 2 MG/2ML IJ SOLN
INTRAMUSCULAR | Status: DC | PRN
  Administered 2017-10-26: 2 mg via INTRAVENOUS

## 2017-10-26 MED ORDER — LIDOCAINE HCL (PF) 2 % IJ SOLN
INTRAOCULAR | Status: DC | PRN
  Administered 2017-10-26: 2 mL

## 2017-10-26 MED ORDER — ONDANSETRON HCL 4 MG/2ML IJ SOLN: 4.0000 mg | Freq: Once | INTRAMUSCULAR | Status: DC | PRN

## 2017-10-26 MED ORDER — NA HYALUR & NA CHOND-NA HYALUR 0.4-0.35 ML IO KIT
PACK | INTRAOCULAR | Status: DC | PRN
  Administered 2017-10-26: 1 mL via INTRAOCULAR

## 2017-10-26 MED ORDER — TETRACAINE HCL 0.5 % OP SOLN
1.0000 [drp] | OPHTHALMIC | Status: DC | PRN
  Administered 2017-10-26 (×2): 1 [drp] via OPHTHALMIC

## 2017-10-26 MED ORDER — ARMC OPHTHALMIC DILATING DROPS
1.0000 "application " | OPHTHALMIC | Status: DC | PRN
  Administered 2017-10-26 (×3): 1 via OPHTHALMIC

## 2017-10-26 MED ORDER — MOXIFLOXACIN HCL 0.5 % OP SOLN
OPHTHALMIC | Status: DC | PRN
  Administered 2017-10-26: 0.2 mL via OPHTHALMIC

## 2017-10-26 SURGICAL SUPPLY — 19 items
CANNULA ANT/CHMB 27GA (MISCELLANEOUS) ×3 IMPLANT
GLOVE SURG LX 7.5 STRW (GLOVE) ×2
GLOVE SURG LX STRL 7.5 STRW (GLOVE) ×1 IMPLANT
GLOVE SURG TRIUMPH 8.0 PF LTX (GLOVE) ×3 IMPLANT
GOWN STRL REUS W/ TWL LRG LVL3 (GOWN DISPOSABLE) ×2 IMPLANT
GOWN STRL REUS W/TWL LRG LVL3 (GOWN DISPOSABLE) ×4
LENS IOL ACRSF IQ ULTRA 18.5 (Intraocular Lens) ×1 IMPLANT
LENS IOL ACRYSOF IQ 18.5 (Intraocular Lens) ×3 IMPLANT
MARKER SKIN DUAL TIP RULER LAB (MISCELLANEOUS) ×3 IMPLANT
NEEDLE FILTER BLUNT 18X 1/2SAF (NEEDLE) ×2
NEEDLE FILTER BLUNT 18X1 1/2 (NEEDLE) ×1 IMPLANT
PACK CATARACT BRASINGTON (MISCELLANEOUS) ×3 IMPLANT
PACK EYE AFTER SURG (MISCELLANEOUS) ×3 IMPLANT
PACK OPTHALMIC (MISCELLANEOUS) ×3 IMPLANT
SYR 3ML LL SCALE MARK (SYRINGE) ×3 IMPLANT
SYR 5ML LL (SYRINGE) ×3 IMPLANT
SYR TB 1ML LUER SLIP (SYRINGE) ×3 IMPLANT
WATER STERILE IRR 500ML POUR (IV SOLUTION) ×3 IMPLANT
WIPE NON LINTING 3.25X3.25 (MISCELLANEOUS) ×3 IMPLANT

## 2017-10-26 NOTE — Transfer of Care (Signed)
Immediate Anesthesia Transfer of Care Note  Patient: Christopher [name]  Procedure(s) Performed: CATARACT EXTRACTION PHACO AND INTRAOCULAR LENS PLACEMENT (IOC) LEFT (Left Eye)  Patient Location: PACU  Anesthesia Type: MAC  Level of Consciousness: awake, alert  and patient cooperative  Airway and Oxygen Therapy: Patient Spontanous Breathing and Patient connected to supplemental oxygen  Post-op Assessment: Post-op Vital signs reviewed, Patient's Cardiovascular Status Stable, Respiratory Function Stable, Patent Airway and No signs of Nausea or vomiting  Post-op Vital Signs: Reviewed and stable  Complications: No apparent anesthesia complications

## 2017-10-26 NOTE — Op Note (Signed)
OPERATIVE NOTE  Christopher [name] [id number] 10/26/2017   PREOPERATIVE DIAGNOSIS:  Nuclear sclerotic cataract left eye. H25.12   POSTOPERATIVE DIAGNOSIS:    Nuclear sclerotic cataract left eye.     PROCEDURE:  Phacoemusification with posterior chamber intraocular lens placement of the left eye   LENS:   Implant Name Type Inv. Item Serial No. Manufacturer Lot No. LRB No. Used  LENS IOL ACRYSOF IQ 18.5 - [id number] Intraocular Lens LENS IOL ACRYSOF IQ 18.5 [id number] ALCON  Left 1        ULTRASOUND TIME: 17  % of 1 minutes 36 seconds, CDE 16.3  SURGEON:  Wyonia Hough, MD   ANESTHESIA:  Topical with tetracaine drops and 2% Xylocaine jelly, augmented with 1% preservative-free intracameral lidocaine.    COMPLICATIONS:  None.   DESCRIPTION OF PROCEDURE:  The patient was identified in the holding room and transported to the operating room and placed in the supine position under the operating microscope.  The left eye was identified as the operative eye and it was prepped and draped in the usual sterile ophthalmic fashion.   A 1 millimeter clear-corneal paracentesis was made at the 1:30 position.  0.5 ml of preservative-free 1% lidocaine was injected into the anterior chamber.  The anterior chamber was filled with Viscoat viscoelastic.  A 2.4 millimeter keratome was used to make a near-clear corneal incision at the 10:30 position.  .  A curvilinear capsulorrhexis was made with a cystotome and capsulorrhexis forceps.  Balanced salt solution was used to hydrodissect and hydrodelineate the nucleus.   Phacoemulsification was then used in stop and chop fashion to remove the lens nucleus and epinucleus.  The remaining cortex was then removed using the irrigation and aspiration handpiece. Provisc was then placed into the capsular bag to distend it for lens placement.  A lens was then injected into the capsular bag.  The remaining viscoelastic was aspirated.   Wounds were hydrated with  balanced salt solution.  The anterior chamber was inflated to a physiologic pressure with balanced salt solution.  No wound leaks were noted. Vigamox 0.2 ml of a 1mg  per ml solution was injected into the anterior chamber for a dose of 0.2 mg of intracameral antibiotic at the completion of the case.   Timolol and Brimonidine drops were applied to the eye.  The patient was taken to the recovery room in stable condition without complications of anesthesia or surgery.  [name] 10/26/2017, 12:06 PM

## 2017-10-26 NOTE — Anesthesia Postprocedure Evaluation (Signed)
Anesthesia Post Note  Patient: Christopher [name]  Procedure(s) Performed: CATARACT EXTRACTION PHACO AND INTRAOCULAR LENS PLACEMENT (IOC) LEFT (Left Eye)  Patient location during evaluation: PACU Anesthesia Type: MAC Level of consciousness: awake and alert Pain management: pain level controlled Vital Signs Assessment: post-procedure vital signs reviewed and stable Respiratory status: spontaneous breathing Cardiovascular status: blood pressure returned to baseline Anesthetic complications: no    Jaci Standard, III,  Skylyn Slezak D

## 2017-10-26 NOTE — Anesthesia Procedure Notes (Signed)
Procedure Name: MAC Performed by: Akelia Husted, CRNA Pre-anesthesia Checklist: Patient identified, Emergency Drugs available, Suction available, Timeout performed and Patient being monitored Patient Re-evaluated:Patient Re-evaluated prior to induction Oxygen Delivery Method: Nasal cannula Placement Confirmation: positive ETCO2       

## 2017-10-26 NOTE — H&P (Signed)
The History and Physical notes are on paper, have been signed, and are to be scanned. The patient remains stable and unchanged from the H&P.   Previous H&P reviewed, patient examined, and there are no changes.  Ed Rayson 10/26/2017 11:42 AM

## 2017-10-26 NOTE — Anesthesia Preprocedure Evaluation (Signed)
Anesthesia Evaluation  Patient identified by MRN, date of birth, ID band Patient awake    Reviewed: Allergy & Precautions, H&P , NPO status , Patient's Chart, lab work & pertinent test results  Airway Mallampati: II  TM Distance: >3 FB Neck ROM: full    Dental no notable dental hx.    Pulmonary former smoker,    Pulmonary exam normal        Cardiovascular hypertension, Normal cardiovascular exam     Neuro/Psych    GI/Hepatic negative GI ROS, Neg liver ROS,   Endo/Other  negative endocrine ROS  Renal/GU      Musculoskeletal   Abdominal   Peds  Hematology negative hematology ROS (+)   Anesthesia Other Findings   Reproductive/Obstetrics                             Anesthesia Physical Anesthesia Plan  ASA: II  Anesthesia Plan: MAC   Post-op Pain Management:    Induction:   PONV Risk Score and Plan:   Airway Management Planned:   Additional Equipment:   Intra-op Plan:   Post-operative Plan:   Informed Consent: I have reviewed the patients History and Physical, chart, labs and discussed the procedure including the risks, benefits and alternatives for the proposed anesthesia with the patient or authorized representative who has indicated his/her understanding and acceptance.     Plan Discussed with:   Anesthesia Plan Comments:         Anesthesia Quick Evaluation

## 2017-10-27 ENCOUNTER — Encounter: Payer: Self-pay | Admitting: Ophthalmology

## 2017-11-01 DIAGNOSIS — S51812A Laceration without foreign body of left forearm, initial encounter: Secondary | ICD-10-CM | POA: Diagnosis not present

## 2017-12-01 DIAGNOSIS — L570 Actinic keratosis: Secondary | ICD-10-CM | POA: Diagnosis not present

## 2017-12-01 DIAGNOSIS — D2339 Other benign neoplasm of skin of other parts of face: Secondary | ICD-10-CM | POA: Diagnosis not present

## 2017-12-01 DIAGNOSIS — Z85828 Personal history of other malignant neoplasm of skin: Secondary | ICD-10-CM | POA: Diagnosis not present

## 2017-12-05 DIAGNOSIS — L989 Disorder of the skin and subcutaneous tissue, unspecified: Secondary | ICD-10-CM | POA: Diagnosis not present

## 2017-12-05 DIAGNOSIS — D692 Other nonthrombocytopenic purpura: Secondary | ICD-10-CM | POA: Diagnosis not present

## 2017-12-05 DIAGNOSIS — N183 Chronic kidney disease, stage 3 (moderate): Secondary | ICD-10-CM | POA: Diagnosis not present

## 2017-12-05 DIAGNOSIS — Z1389 Encounter for screening for other disorder: Secondary | ICD-10-CM | POA: Diagnosis not present

## 2017-12-05 DIAGNOSIS — I872 Venous insufficiency (chronic) (peripheral): Secondary | ICD-10-CM | POA: Diagnosis not present

## 2017-12-05 DIAGNOSIS — Z79899 Other long term (current) drug therapy: Secondary | ICD-10-CM | POA: Diagnosis not present

## 2017-12-15 ENCOUNTER — Encounter: Payer: Self-pay | Admitting: Podiatry

## 2017-12-15 ENCOUNTER — Ambulatory Visit (INDEPENDENT_AMBULATORY_CARE_PROVIDER_SITE_OTHER): Payer: Medicare Other | Admitting: Podiatry

## 2017-12-15 VITALS — BP 138/75 | HR 72

## 2017-12-15 DIAGNOSIS — B351 Tinea unguium: Secondary | ICD-10-CM | POA: Diagnosis not present

## 2017-12-15 DIAGNOSIS — M79675 Pain in left toe(s): Secondary | ICD-10-CM | POA: Diagnosis not present

## 2017-12-15 DIAGNOSIS — R600 Localized edema: Secondary | ICD-10-CM

## 2017-12-15 DIAGNOSIS — M79674 Pain in right toe(s): Secondary | ICD-10-CM | POA: Diagnosis not present

## 2017-12-15 NOTE — Patient Instructions (Signed)

## 2018-01-12 DIAGNOSIS — B356 Tinea cruris: Secondary | ICD-10-CM | POA: Diagnosis not present

## 2018-01-12 DIAGNOSIS — Z85038 Personal history of other malignant neoplasm of large intestine: Secondary | ICD-10-CM | POA: Diagnosis not present

## 2018-01-12 DIAGNOSIS — C61 Malignant neoplasm of prostate: Secondary | ICD-10-CM | POA: Diagnosis not present

## 2018-01-14 ENCOUNTER — Encounter: Payer: Self-pay | Admitting: Podiatry

## 2018-01-14 NOTE — Progress Notes (Signed)
Subjective: Christopher [name] presents today referred by [name], MD with cc of painful, discolored, thick toenails which interfere with daily activities.  Duration is greater than 2 months. Pain is aggravated when wearing enclosed shoe gear. He denies any attempt at treatment.  Past Medical History:  Diagnosis Date  . Bruises easily    both hands  . Cancer [facility]) 2005   prostate cancer treated with external beam radiation and radioactive seeds  . Cataracts, bilateral    immature  . Chronic kidney disease   . Complication of anesthesia    states he was given too much anesthesia during his knee replacement  . Constipation    takes Colace daily as needed  . Decreased hearing   . Diverticulitis 2003  . DJD (degenerative joint disease)    thumbs and knees  . History of colon polyps    benign  . History of gout   . Joint pain   . Joint swelling   . Leg cramps    no meds  . Peripheral edema    takes Maxzide daily  . Pneumonia    hx of-15+yrs ago  . Ringing in ears   . UTI (lower urinary tract infection)    just completed antibiotic on 11/14/14    Patient Active Problem List   Diagnosis Date Noted  . Carpal tunnel syndrome of left wrist 12/28/2016  . Cubital tunnel syndrome on left 12/28/2016  . Trigger ring finger of right hand 12/14/2016  . Chronic maxillary sinusitis 08/05/2016  . Noise effect on both inner ears 08/05/2016  . Presbycusis of both ears 08/05/2016  . Dermatitis 10/23/2015  . Primary osteoarthritis of right hip 11/26/2014  . Severe obesity (BMI >= 40) ([place]) 11/26/2014  . S/P total hip arthroplasty 11/26/2014  . Acute cystitis without hematuria 11/03/2014  . Varicose veins of lower extremities with other complications 38/18/2993  . Elevated prostate specific antigen (PSA) 01/04/2011  . Cancer of prostate ([place]) 11/22/2010    Past Surgical History:  Procedure Laterality Date  . APPENDECTOMY  1941  . CATARACT EXTRACTION W/PHACO Right 10/05/2017    Procedure: CATARACT EXTRACTION PHACO AND INTRAOCULAR LENS PLACEMENT ([place]) RIGHT;  Surgeon: Leandrew Koyanagi, MD;  Location: [place];  Service: Ophthalmology;  Laterality: Right;  requests arrival time to be after 10am  . CATARACT EXTRACTION W/PHACO Left 10/26/2017   Procedure: CATARACT EXTRACTION PHACO AND INTRAOCULAR LENS PLACEMENT ([place]) LEFT;  Surgeon: Leandrew Koyanagi, MD;  Location: [place];  Service: Ophthalmology;  Laterality: Left;  . COLONOSCOPY    . ENDOVENOUS ABLATION SAPHENOUS VEIN W/ LASER  12-01-2011   left greater saphenous vein by Curt Jews MD   . ENDOVENOUS ABLATION SAPHENOUS VEIN W/ LASER  12-22-2011   right greater saphenous vein    by Curt Jews MD  . HAND SURGERY    . [place]  . PARTIAL COLECTOMY    . prostate seeds     . removal of chest wall tumor    . sigmoid colon resection    . TOTAL HIP ARTHROPLASTY Right 11/26/2014   Procedure: TOTAL HIP ARTHROPLASTY;  Surgeon: Garald Balding, MD;  Location: [place];  Service: Orthopedics;  Laterality: Right;  . TOTAL KNEE ARTHROPLASTY  08-2010     Current Outpatient Medications:  .  clotrimazole-betamethasone (LOTRISONE) cream, Apply topically 2 (two) times daily as needed (itching). , Disp: , Rfl:  .  docusate sodium (COLACE) 100 MG capsule, Take 100 mg by mouth daily as needed  for mild constipation., Disp: , Rfl:  .  ketoconazole (NIZORAL) 2 % cream, , Disp: , Rfl:  .  OVER THE COUNTER MEDICATION, daily. Swedish bitters, 2 tsp, Disp: , Rfl:  .  tamsulosin (FLOMAX) 0.4 MG CAPS capsule, Take 0.4 mg by mouth daily., Disp: , Rfl: 11 .  triamterene-hydrochlorothiazide (MAXZIDE) 75-50 MG per tablet, Take 1 tablet by mouth daily., Disp: , Rfl:   Allergies  Allergen Reactions  . Chicken Allergy Anaphylaxis  . Other Anaphylaxis and Swelling    Chicken  . Penicillins Itching and Swelling  . Poultry Meal Swelling    Social History   Occupational History  . Not on file   Tobacco Use  . Smoking status: Former Smoker    Packs/day: 1.00    Years: 12.00    Pack years: 12.00    Types: Cigarettes    Last attempt to quit: 07/21/1956    Years since quitting: 61.5  . Smokeless tobacco: Never Used  . Tobacco comment: quick smoking 91yrs ago  Substance and Sexual Activity  . Alcohol use: No  . Drug use: No  . Sexual activity: Not Currently    Family History  Problem Relation Age of Onset  . Kidney disease Mother      There is no immunization history on file for this patient.   Review of systems: Positive Findings in bold print.  Constitutional:  chills, fatigue, fever, sweats, weight change Communication: Optometrist, sign Ecologist, hand writing, iPad/Android device Eyes: diplopia, glare,  light sensitivity, eyeglasses, blindness Ears nose mouth throat: Hard of hearing, deaf, sign language,  vertigo,   bloody nose,  rhinitis,  cold sores, snoring Cardiovascular: HTN, edema, arrhythmia, pacemaker in place, defibrillator in place,  chest pain/tightness, chronic anticoagulation, blood clot, varicose veins lower extremities Respiratory:  difficulty breathing, denies congestion, SOB, wheezing, cough Gastrointestinal: abdominal pain, constipation, diarrhea, nausea, vomiting,  Genitourinary:  nocturia,  pain on urination,  blood in urine, Foley catheter, urinary urgency Musculoskeletal: Uses mobility aid,  cramping, stiff joints, painful joints, swollen joints, gout, osteoarthritis Skin: +changes in toenails, color change dryness, itchy skin, mole changes, or rash  Neurological: numbness, paresthesias, burning in feet, denies fainting,  seizure, change in speech. denies headaches, memory problems/poor historian, cerebral palsy, carpal tunnel syndrome Endocrine: diabetes, hypothyroidism, hyperthyroidism,  dry mouth, flushing, denies heat intolerance,  cold intolerance,  excessive thirst, denies polyuria,  nocturia Hematological:  easy bleeding,   excessive bleeding, easy bruising, enlarged lymph nodes, on long term blood thinner Allergy/immunological:  hives, frequent infections, multiple drug allergies, seasonal allergies,  Psychiatric:  anxiety, depression, mood disorder, suicidal ideations, hallucinations   Objective: Vascular Examination: Capillary refill time immediate x 10 digits Dorsalis pedis Posterior tibial pulses faintly palpable  b/l No digital hair x 10 digits Skin temperature gradient WNL b/l Edema b/l LE L>R +varicosities BLE  Dermatological Examination: Skin with normal turgor, texture and tone b/l  Toenails 1-5 b/l discolored, thick, dystrophic with subungual debris and pain with palpation to nailbeds due to thickness of nails.  Musculoskeletal: Muscle strength 5/5 to all LE muscle groups  No calf tenderness to palpation b/l LE  Neurological: Sensation intact with 10 gram monofilament Vibratory sensation intact.  Assessment: 1. Painful onychomycosis toenails 1-5 b/l  2. Edema b/l LE  Plan: 1. Discussed onychomycosis and treatment options.  Literature dispensed on today. 2. Toenails 1-5 b/l were debrided in length and girth without iatrogenic bleeding. 3. Patient to continue soft, supportive shoe gear 4. Patient to report any pedal  injuries to medical professional immediately. 5. Follow up 3 months. Patient/POA to call should there be a concern in the interim.

## 2018-01-23 ENCOUNTER — Ambulatory Visit (INDEPENDENT_AMBULATORY_CARE_PROVIDER_SITE_OTHER): Payer: Medicare Other

## 2018-01-23 ENCOUNTER — Telehealth (INDEPENDENT_AMBULATORY_CARE_PROVIDER_SITE_OTHER): Payer: Self-pay | Admitting: *Deleted

## 2018-01-23 ENCOUNTER — Ambulatory Visit (INDEPENDENT_AMBULATORY_CARE_PROVIDER_SITE_OTHER): Payer: Medicare Other | Admitting: Orthopaedic Surgery

## 2018-01-23 ENCOUNTER — Encounter (INDEPENDENT_AMBULATORY_CARE_PROVIDER_SITE_OTHER): Payer: Self-pay | Admitting: Orthopaedic Surgery

## 2018-01-23 VITALS — BP 109/56 | HR 56 | Wt 257.0 lb

## 2018-01-23 DIAGNOSIS — M1712 Unilateral primary osteoarthritis, left knee: Secondary | ICD-10-CM

## 2018-01-23 DIAGNOSIS — M170 Bilateral primary osteoarthritis of knee: Secondary | ICD-10-CM | POA: Diagnosis not present

## 2018-01-23 DIAGNOSIS — M25562 Pain in left knee: Secondary | ICD-10-CM

## 2018-01-23 DIAGNOSIS — G8929 Other chronic pain: Secondary | ICD-10-CM

## 2018-01-23 MED ORDER — LIDOCAINE HCL 1 % IJ SOLN
2.0000 mL | INTRAMUSCULAR | Status: AC | PRN
  Administered 2018-01-23: 2 mL

## 2018-01-23 MED ORDER — METHYLPREDNISOLONE ACETATE 40 MG/ML IJ SUSP
80.0000 mg | INTRAMUSCULAR | Status: AC | PRN
  Administered 2018-01-23: 80 mg

## 2018-01-23 MED ORDER — BUPIVACAINE HCL 0.5 % IJ SOLN
2.0000 mL | INTRAMUSCULAR | Status: AC | PRN
  Administered 2018-01-23: 2 mL via INTRA_ARTICULAR

## 2018-01-23 NOTE — Telephone Encounter (Signed)
Please apply for Euflexxa left knee for Dr. Durward Fortes patient. Thank you.

## 2018-01-23 NOTE — Progress Notes (Signed)
Office Visit Note   Patient: Christopher [name]           Date of Birth: 08/19/1928           MRN: [id number] Visit Date: 01/23/2018              Requested by: [name], [place]. [address] [employer] [address] [place], [place] [address] PCP: [name], MD   Assessment & Plan: Visit Diagnoses:  1. Chronic pain of left knee   2. Bilateral primary osteoarthritis of knee     Plan: Osteo-arthritis left knee.  Will inject with cortisone and precertify Visco supplementation  Follow-Up Instructions: Return in about 2 weeks (around 02/06/2018).   Orders:  Orders Placed This Encounter  Procedures  . XR KNEE 3 VIEW LEFT   No orders of the defined types were placed in this encounter.     Procedures: Large Joint Inj: L knee on 01/23/2018 8:45 AM Indications: pain and diagnostic evaluation Details: 25 G 1.5 in needle, anteromedial approach  Arthrogram: No  Medications: 2 mL lidocaine 1 %; 2 mL bupivacaine 0.5 %; 80 mg methylPREDNISolone acetate 40 MG/ML Procedure, treatment alternatives, risks and benefits explained, specific risks discussed. Consent was given by the patient. Patient was prepped and draped in the usual sterile fashion.       Clinical Data: No additional findings.   Subjective: Chief Complaint  Patient presents with  . Left Knee - Follow-up, Pain  Mr Zapata is 85 years old and visited the office for evaluation of an exacerbation of left knee pain.  He has a prior history of osteoarthritis by prior films.  He is had cortisone and Visco supplementation with excellent relief.  His last injection was about a year and a half ago.  He has had recurrent pain to the point of compromise and would like to start the Visco supplementation again.  No injury or trauma.  Pain is mostly along the medial compartment.  He has tried over-the-counter medicines  HPI  Review of Systems   Objective: Vital Signs: BP (!) 109/56 (BP Location: Left Arm, Patient Position: Sitting, Cuff  Size: Normal)   Pulse (!) 56   Wt 257 lb (116.6 kg)   BMI 39.08 kg/m   Physical Exam Constitutional:      Appearance: He is well-developed.  Eyes:     Pupils: Pupils are equal, round, and reactive to light.  Pulmonary:     Effort: Pulmonary effort is normal.  Skin:    General: Skin is warm and dry.  Neurological:     Mental Status: He is alert and oriented to person, place, and time.  Psychiatric:        Behavior: Behavior normal.     Ortho Exam awake alert and oriented x3.  Comfortable sitting.  Predominately medial joint pain left knee with very minimal effusion.  Increased varus.  Some patellar crepitation.  No lateral joint pain.  Venous stasis changes distally with nonpitting edema.  Good capillary refill to toes.  Straight leg raise negative Specialty Comments:  No specialty comments available.  Imaging: Xr Knee 3 View Left  Result Date: 01/23/2018 Films of the left knee were obtained in several projections standing.  There is bone-on-bone in the medial compartment with about 5 degrees of varus.  There are degenerative changes at the lateral compartment and at the patellofemoral joint as well.  No ectopic calcification.  No acute changes    PMFS History: Patient Active Problem List   Diagnosis  Date Noted  . Carpal tunnel syndrome of left wrist 12/28/2016  . Cubital tunnel syndrome on left 12/28/2016  . Trigger ring finger of right hand 12/14/2016  . Chronic maxillary sinusitis 08/05/2016  . Noise effect on both inner ears 08/05/2016  . Presbycusis of both ears 08/05/2016  . Dermatitis 10/23/2015  . Primary osteoarthritis of right hip 11/26/2014  . Severe obesity (BMI >= 40) ([place]) 11/26/2014  . S/P total hip arthroplasty 11/26/2014  . Acute cystitis without hematuria 11/03/2014  . Varicose veins of lower extremities with other complications 32/35/5732  . Elevated prostate specific antigen (PSA) 01/04/2011  . Cancer of prostate ([place]) 11/22/2010   Past Medical  History:  Diagnosis Date  . Bruises easily    both hands  . Cancer [facility]) 2005   prostate cancer treated with external beam radiation and radioactive seeds  . Cataracts, bilateral    immature  . Chronic kidney disease   . Complication of anesthesia    states he was given too much anesthesia during his knee replacement  . Constipation    takes Colace daily as needed  . Decreased hearing   . Diverticulitis 2003  . DJD (degenerative joint disease)    thumbs and knees  . History of colon polyps    benign  . History of gout   . Joint pain   . Joint swelling   . Leg cramps    no meds  . Peripheral edema    takes Maxzide daily  . Pneumonia    hx of-15+yrs ago  . Ringing in ears   . UTI (lower urinary tract infection)    just completed antibiotic on 11/14/14    Family History  Problem Relation Age of Onset  . Kidney disease Mother     Past Surgical History:  Procedure Laterality Date  . APPENDECTOMY  1941  . CATARACT EXTRACTION W/PHACO Right 10/05/2017   Procedure: CATARACT EXTRACTION PHACO AND INTRAOCULAR LENS PLACEMENT ([place]) RIGHT;  Surgeon: Leandrew Koyanagi, MD;  Location: [place];  Service: Ophthalmology;  Laterality: Right;  requests arrival time to be after 10am  . CATARACT EXTRACTION W/PHACO Left 10/26/2017   Procedure: CATARACT EXTRACTION PHACO AND INTRAOCULAR LENS PLACEMENT ([place]) LEFT;  Surgeon: Leandrew Koyanagi, MD;  Location: [place];  Service: Ophthalmology;  Laterality: Left;  . COLONOSCOPY    . ENDOVENOUS ABLATION SAPHENOUS VEIN W/ LASER  12-01-2011   left greater saphenous vein by Curt Jews MD   . ENDOVENOUS ABLATION SAPHENOUS VEIN W/ LASER  12-22-2011   right greater saphenous vein    by Curt Jews MD  . HAND SURGERY    . [place]  . PARTIAL COLECTOMY    . prostate seeds     . removal of chest wall tumor    . sigmoid colon resection    . TOTAL HIP ARTHROPLASTY Right 11/26/2014   Procedure: TOTAL HIP  ARTHROPLASTY;  Surgeon: Garald Balding, MD;  Location: [place];  Service: Orthopedics;  Laterality: Right;  . TOTAL KNEE ARTHROPLASTY  08-2010   Social History   Occupational History  . Not on file  Tobacco Use  . Smoking status: Former Smoker    Packs/day: 1.00    Years: 12.00    Pack years: 12.00    Types: Cigarettes    Last attempt to quit: 07/21/1956    Years since quitting: 61.5  . Smokeless tobacco: Never Used  . Tobacco comment: quick smoking 21yrs ago  Substance and Sexual Activity  .  Alcohol use: No  . Drug use: No  . Sexual activity: Not Currently     Garald Balding, MD   Note - This record has been created using [employer].  Chart creation errors have been sought, but may not always  have been located. Such creation errors do not reflect on  the standard of medical care.

## 2018-01-24 NOTE — Telephone Encounter (Signed)
Noted  

## 2018-02-07 ENCOUNTER — Encounter (INDEPENDENT_AMBULATORY_CARE_PROVIDER_SITE_OTHER): Payer: Self-pay | Admitting: Orthopaedic Surgery

## 2018-02-07 ENCOUNTER — Ambulatory Visit (INDEPENDENT_AMBULATORY_CARE_PROVIDER_SITE_OTHER): Payer: Medicare Other | Admitting: Orthopaedic Surgery

## 2018-02-07 ENCOUNTER — Ambulatory Visit (INDEPENDENT_AMBULATORY_CARE_PROVIDER_SITE_OTHER): Payer: Medicare Other

## 2018-02-07 ENCOUNTER — Telehealth (INDEPENDENT_AMBULATORY_CARE_PROVIDER_SITE_OTHER): Payer: Self-pay

## 2018-02-07 VITALS — BP 112/53 | HR 87 | Ht 68.0 in | Wt 255.0 lb

## 2018-02-07 DIAGNOSIS — M170 Bilateral primary osteoarthritis of knee: Secondary | ICD-10-CM | POA: Insufficient documentation

## 2018-02-07 DIAGNOSIS — M25551 Pain in right hip: Secondary | ICD-10-CM | POA: Diagnosis not present

## 2018-02-07 DIAGNOSIS — M1712 Unilateral primary osteoarthritis, left knee: Secondary | ICD-10-CM | POA: Diagnosis not present

## 2018-02-07 MED ORDER — SODIUM HYALURONATE (VISCOSUP) 20 MG/2ML IX SOSY
20.0000 mg | PREFILLED_SYRINGE | INTRA_ARTICULAR | Status: AC | PRN
  Administered 2018-02-07: 20 mg via INTRA_ARTICULAR

## 2018-02-07 NOTE — Telephone Encounter (Signed)
Submitted VOB for Euflexxa series, left knee.

## 2018-02-07 NOTE — Progress Notes (Signed)
Office Visit Note   Patient: Christopher [name]           Date of Birth: June 17, 1928           MRN: [id number] Visit Date: 02/07/2018              Requested by: [name], [place] [place], [place] [address] PCP: [name], MD   Assessment & Plan: Visit Diagnoses:  1. Pain in right hip   2. Bilateral primary osteoarthritis of knee     Plan: Recent onset of right hip pain posteriorly that could be referred from his back.  Films of his right total hip replacement reveal excellent position without any obvious complications.  He is several years postop.  Also has been followed for the osteoarthritis of his left knee.  He has had a cortisone injection several weeks ago that helped.  I am going to start Euflexxa injections today and have him return weekly in the next 2 weeks to complete the series  Follow-Up Instructions: Return in about 1 week (around 02/14/2018).   Orders:  Orders Placed This Encounter  Procedures  . XR HIP UNILAT W OR W/O PELVIS 2-3 VIEWS RIGHT   No orders of the defined types were placed in this encounter.     Procedures: Large Joint Inj: L knee on 02/07/2018 4:35 PM Indications: pain and joint swelling Details: 25 G 1.5 in needle, anteromedial approach  Arthrogram: No  Medications: 20 mg Sodium Hyaluronate 20 MG/2ML Outcome: tolerated well, no immediate complications Procedure, treatment alternatives, risks and benefits explained, specific risks discussed. Consent was given by the patient. Immediately prior to procedure a time out was called to verify the correct patient, procedure, equipment, support staff and site/side marked as required. Patient was prepped and draped in the usual sterile fashion.       Clinical Data: No additional findings.   Subjective: Chief Complaint  Patient presents with  . Left Knee - Follow-up  . Right Hip - Pain  . Knee Pain    still having pain can't put weight on his kne , having rt side hip  pain hx of hip replacement started about 2 weeks ago. ,   Relates having some relief of his left knee pain after the cortisone injection.  Has osteoarthritis  HPI  Review of Systems   Objective: Vital Signs: BP (!) 112/53   Pulse 87   Ht 5\' 8"  (1.727 m)   Wt 255 lb (115.7 kg)   BMI 38.77 kg/m   Physical Exam Constitutional:      Appearance: He is well-developed.  Eyes:     Pupils: Pupils are equal, round, and reactive to light.  Pulmonary:     Effort: Pulmonary effort is normal.  Skin:    General: Skin is warm and dry.  Neurological:     Mental Status: He is alert and oriented to person, place, and time.  Psychiatric:        Behavior: Behavior normal.     Ortho Exam awake alert and oriented x3.  Comfortable sitting.  Slight limp referable to his left knee.  Large legs.  Some mild pitting edema both ankles.  Full extension of his left knee with minimal effusion.  Predominant medial joint pain and slight varus.  Painless range of motion right hip with internal and external rotation.  Straight leg raise negative.  Some mild buttock pain but no pain laterally Specialty Comments:  No specialty comments available.  Imaging: Xr Hip Unilat W Or W/o Pelvis 2-3 Views Right  Result Date: 02/07/2018 AP pelvis and lateral of right hip demonstrate excellent position of the right total hip replacement without complication.  No evidence of polyethylene wear or ectopic calcification.  No acute changes    PMFS History: Patient Active Problem List   Diagnosis Date Noted  . Pain in right hip 02/07/2018  . Bilateral primary osteoarthritis of knee 02/07/2018  . Carpal tunnel syndrome of left wrist 12/28/2016  . Cubital tunnel syndrome on left 12/28/2016  . Trigger ring finger of right hand 12/14/2016  . Chronic maxillary sinusitis 08/05/2016  . Noise effect on both inner ears 08/05/2016  . Presbycusis of both ears 08/05/2016  . Dermatitis 10/23/2015  . Primary osteoarthritis of right  hip 11/26/2014  . Severe obesity (BMI >= 40) ([place]) 11/26/2014  . S/P total hip arthroplasty 11/26/2014  . Acute cystitis without hematuria 11/03/2014  . Varicose veins of lower extremities with other complications 00/86/7619  . Elevated prostate specific antigen (PSA) 01/04/2011  . Cancer of prostate ([place]) 11/22/2010   Past Medical History:  Diagnosis Date  . Bruises easily    both hands  . Cancer [facility]) 2005   prostate cancer treated with external beam radiation and radioactive seeds  . Cataracts, bilateral    immature  . Chronic kidney disease   . Complication of anesthesia    states he was given too much anesthesia during his knee replacement  . Constipation    takes Colace daily as needed  . Decreased hearing   . Diverticulitis 2003  . DJD (degenerative joint disease)    thumbs and knees  . History of colon polyps    benign  . History of gout   . Joint pain   . Joint swelling   . Leg cramps    no meds  . Peripheral edema    takes Maxzide daily  . Pneumonia    hx of-15+yrs ago  . Ringing in ears   . UTI (lower urinary tract infection)    just completed antibiotic on 11/14/14    Family History  Problem Relation Age of Onset  . Kidney disease Mother     Past Surgical History:  Procedure Laterality Date  . APPENDECTOMY  1941  . CATARACT EXTRACTION W/PHACO Right 10/05/2017   Procedure: CATARACT EXTRACTION PHACO AND INTRAOCULAR LENS PLACEMENT ([place]) RIGHT;  Surgeon: Leandrew Koyanagi, MD;  Location: [place];  Service: Ophthalmology;  Laterality: Right;  requests arrival time to be after 10am  . CATARACT EXTRACTION W/PHACO Left 10/26/2017   Procedure: CATARACT EXTRACTION PHACO AND INTRAOCULAR LENS PLACEMENT ([place]) LEFT;  Surgeon: Leandrew Koyanagi, MD;  Location: [place];  Service: Ophthalmology;  Laterality: Left;  . COLONOSCOPY    . ENDOVENOUS ABLATION SAPHENOUS VEIN W/ LASER  12-01-2011   left greater saphenous vein by Curt Jews MD   .  ENDOVENOUS ABLATION SAPHENOUS VEIN W/ LASER  12-22-2011   right greater saphenous vein    by Curt Jews MD  . HAND SURGERY    . [place]  . PARTIAL COLECTOMY    . prostate seeds     . removal of chest wall tumor    . sigmoid colon resection    . TOTAL HIP ARTHROPLASTY Right 11/26/2014   Procedure: TOTAL HIP ARTHROPLASTY;  Surgeon: Garald Balding, MD;  Location: [place];  Service: Orthopedics;  Laterality: Right;  . TOTAL KNEE ARTHROPLASTY  08-2010   Social History  Occupational History  . Not on file  Tobacco Use  . Smoking status: Former Smoker    Packs/day: 1.00    Years: 12.00    Pack years: 12.00    Types: Cigarettes    Last attempt to quit: 07/21/1956    Years since quitting: 61.5  . Smokeless tobacco: Never Used  . Tobacco comment: quick smoking 61yrs ago  Substance and Sexual Activity  . Alcohol use: No  . Drug use: No  . Sexual activity: Not Currently     Garald Balding, MD   Note - This record has been created using [employer].  Chart creation errors have been sought, but may not always  have been located. Such creation errors do not reflect on  the standard of medical care.

## 2018-02-13 ENCOUNTER — Telehealth (INDEPENDENT_AMBULATORY_CARE_PROVIDER_SITE_OTHER): Payer: Self-pay

## 2018-02-13 NOTE — Telephone Encounter (Signed)
Please schedule patient an appointment with Dr. Durward Fortes for gel injection.  Thank You.  Patient is approved for Euflexxa series, left knee. Buy & Bill Covered at 100% through insurance. No Co-pay No PA required

## 2018-02-14 ENCOUNTER — Encounter (INDEPENDENT_AMBULATORY_CARE_PROVIDER_SITE_OTHER): Payer: Self-pay | Admitting: Orthopaedic Surgery

## 2018-02-14 ENCOUNTER — Ambulatory Visit (INDEPENDENT_AMBULATORY_CARE_PROVIDER_SITE_OTHER): Payer: Medicare Other | Admitting: Orthopaedic Surgery

## 2018-02-14 VITALS — BP 135/76 | HR 84 | Ht 68.0 in | Wt 253.0 lb

## 2018-02-14 DIAGNOSIS — M170 Bilateral primary osteoarthritis of knee: Secondary | ICD-10-CM | POA: Diagnosis not present

## 2018-02-14 MED ORDER — SODIUM HYALURONATE (VISCOSUP) 20 MG/2ML IX SOSY
20.0000 mg | PREFILLED_SYRINGE | INTRA_ARTICULAR | Status: AC | PRN
  Administered 2018-02-14: 20 mg via INTRA_ARTICULAR

## 2018-02-14 NOTE — Progress Notes (Signed)
Office Visit Note   Patient: Christopher [name]           Date of Birth: 1928-03-18           MRN: [id number] Visit Date: 02/14/2018              Requested by: [name], MD [address] [place], [place] [address] PCP: [name], MD   Assessment & Plan: Visit Diagnoses:  1. Bilateral primary osteoarthritis of knee     Plan: Second Euflexxa injection left knee  Follow-Up Instructions: Return in about 1 week (around 02/21/2018).   Orders:  Orders Placed This Encounter  Procedures  . Large Joint Inj: L knee   No orders of the defined types were placed in this encounter.     Procedures: Large Joint Inj: L knee on 02/14/2018 5:09 PM Indications: pain and joint swelling Details: 25 G 1.5 in needle, anteromedial approach  Arthrogram: No  Medications: 20 mg Sodium Hyaluronate 20 MG/2ML Outcome: tolerated well, no immediate complications Procedure, treatment alternatives, risks and benefits explained, specific risks discussed. Consent was given by the patient. Immediately prior to procedure a time out was called to verify the correct patient, procedure, equipment, support staff and site/side marked as required. Patient was prepped and draped in the usual sterile fashion.       Clinical Data: No additional findings.   Subjective: Chief Complaint  Patient presents with  . Left Knee - Injections  . Injections    here for 2nd Euflexxa injection. 1st injection 02/07/18 with relief. This is patients second series of Euflexxa, lasted 2 years previously. Sharp stabbing pain at medial aspect of left knee.     HPI  Review of Systems   Objective: Vital Signs: BP 135/76 (BP Location: Left Arm, Patient Position: Sitting, Cuff Size: Normal)   Pulse 84   Ht 5\' 8"  (1.727 m)   Wt 253 lb (114.8 kg)   BMI 38.47 kg/m   Physical Exam  Ortho Exam left knee was not hot red warm or swollen.  Better since Euflexxa injection last week  Specialty Comments:  No  specialty comments available.  Imaging: No results found.   PMFS History: Patient Active Problem List   Diagnosis Date Noted  . Pain in right hip 02/07/2018  . Bilateral primary osteoarthritis of knee 02/07/2018  . Carpal tunnel syndrome of left wrist 12/28/2016  . Cubital tunnel syndrome on left 12/28/2016  . Trigger ring finger of right hand 12/14/2016  . Chronic maxillary sinusitis 08/05/2016  . Noise effect on both inner ears 08/05/2016  . Presbycusis of both ears 08/05/2016  . Dermatitis 10/23/2015  . Primary osteoarthritis of right hip 11/26/2014  . Severe obesity (BMI >= 40) ([place]) 11/26/2014  . S/P total hip arthroplasty 11/26/2014  . Acute cystitis without hematuria 11/03/2014  . Varicose veins of lower extremities with other complications 69/62/9528  . Elevated prostate specific antigen (PSA) 01/04/2011  . Cancer of prostate ([place]) 11/22/2010   Past Medical History:  Diagnosis Date  . Bruises easily    both hands  . Cancer [facility]) 2005   prostate cancer treated with external beam radiation and radioactive seeds  . Cataracts, bilateral    immature  . Chronic kidney disease   . Complication of anesthesia    states he was given too much anesthesia during his knee replacement  . Constipation    takes Colace daily as needed  . Decreased hearing   . Diverticulitis 2003  .  DJD (degenerative joint disease)    thumbs and knees  . History of colon polyps    benign  . History of gout   . Joint pain   . Joint swelling   . Leg cramps    no meds  . Peripheral edema    takes Maxzide daily  . Pneumonia    hx of-15+yrs ago  . Ringing in ears   . UTI (lower urinary tract infection)    just completed antibiotic on 11/14/14    Family History  Problem Relation Age of Onset  . Kidney disease Mother     Past Surgical History:  Procedure Laterality Date  . APPENDECTOMY  1941  . CATARACT EXTRACTION W/PHACO Right 10/05/2017   Procedure: CATARACT EXTRACTION PHACO AND  INTRAOCULAR LENS PLACEMENT ([place]) RIGHT;  Surgeon: Leandrew Koyanagi, MD;  Location: [place];  Service: Ophthalmology;  Laterality: Right;  requests arrival time to be after 10am  . CATARACT EXTRACTION W/PHACO Left 10/26/2017   Procedure: CATARACT EXTRACTION PHACO AND INTRAOCULAR LENS PLACEMENT ([place]) LEFT;  Surgeon: Leandrew Koyanagi, MD;  Location: [place];  Service: Ophthalmology;  Laterality: Left;  . COLONOSCOPY    . ENDOVENOUS ABLATION SAPHENOUS VEIN W/ LASER  12-01-2011   left greater saphenous vein by Curt Jews MD   . ENDOVENOUS ABLATION SAPHENOUS VEIN W/ LASER  12-22-2011   right greater saphenous vein    by Curt Jews MD  . HAND SURGERY    . [place]  . PARTIAL COLECTOMY    . prostate seeds     . removal of chest wall tumor    . sigmoid colon resection    . TOTAL HIP ARTHROPLASTY Right 11/26/2014   Procedure: TOTAL HIP ARTHROPLASTY;  Surgeon: Garald Balding, MD;  Location: [place];  Service: Orthopedics;  Laterality: Right;  . TOTAL KNEE ARTHROPLASTY  08-2010   Social History   Occupational History  . Not on file  Tobacco Use  . Smoking status: Former Smoker    Packs/day: 1.00    Years: 12.00    Pack years: 12.00    Types: Cigarettes    Last attempt to quit: 07/21/1956    Years since quitting: 61.6  . Smokeless tobacco: Never Used  . Tobacco comment: quick smoking 47yrs ago  Substance and Sexual Activity  . Alcohol use: No  . Drug use: No  . Sexual activity: Not Currently     Garald Balding, MD   Note - This record has been created using [employer].  Chart creation errors have been sought, but may not always  have been located. Such creation errors do not reflect on  the standard of medical care.

## 2018-02-23 ENCOUNTER — Ambulatory Visit (INDEPENDENT_AMBULATORY_CARE_PROVIDER_SITE_OTHER): Payer: Medicare Other | Admitting: Orthopaedic Surgery

## 2018-02-23 DIAGNOSIS — M1712 Unilateral primary osteoarthritis, left knee: Secondary | ICD-10-CM | POA: Diagnosis not present

## 2018-02-23 MED ORDER — SODIUM HYALURONATE (VISCOSUP) 20 MG/2ML IX SOSY
20.0000 mg | PREFILLED_SYRINGE | INTRA_ARTICULAR | Status: AC | PRN
  Administered 2018-02-23: 20 mg via INTRA_ARTICULAR

## 2018-02-23 MED ORDER — LIDOCAINE HCL 1 % IJ SOLN
2.0000 mL | INTRAMUSCULAR | Status: AC | PRN
  Administered 2018-02-23: 2 mL

## 2018-02-23 NOTE — Progress Notes (Signed)
Office Visit Note   Patient: Christopher [name]           Date of Birth: February 22, 1928           MRN: [id number] Visit Date: 02/23/2018              Requested by: [name], MD [address] [place], [place] [address] PCP: [name], MD   Assessment & Plan: Visit Diagnoses:  1. Unilateral primary osteoarthritis, left knee     Plan: Second Euflexxa injection left knee  Follow-Up Instructions: Return if symptoms worsen or fail to improve.   Orders:  No orders of the defined types were placed in this encounter.  No orders of the defined types were placed in this encounter.     Procedures: Large Joint Inj: L knee on 02/23/2018 11:34 AM Indications: pain and joint swelling Details: 25 G 1.5 in needle, anteromedial approach  Arthrogram: No  Medications: 2 mL lidocaine 1 %; 20 mg Sodium Hyaluronate 20 MG/2ML Outcome: tolerated well, no immediate complications Procedure, treatment alternatives, risks and benefits explained, specific risks discussed. Consent was given by the patient. Immediately prior to procedure a time out was called to verify the correct patient, procedure, equipment, support staff and site/side marked as required. Patient was prepped and draped in the usual sterile fashion.       Clinical Data: No additional findings.    Subjective: No chief complaint on file.   HPI   Review of Systems  Constitutional: Negative.   HENT: Negative.  Negative for trouble swallowing.   Eyes: Negative for pain.  Respiratory: Negative.   Cardiovascular: Negative.   Gastrointestinal: Negative.  Negative for constipation.  Endocrine: Negative for cold intolerance.  Genitourinary: Negative.  Negative for difficulty urinating.  Musculoskeletal: Positive for joint swelling.  Skin: Negative.  Negative for rash.  Allergic/Immunologic: Positive for food allergies.  Neurological: Negative.  Negative for weakness.  Hematological: Negative.     Psychiatric/Behavioral: Negative.  Negative for sleep disturbance.     Objective: Vital Signs: There were no vitals taken for this visit.  Physical Exam Constitutional:      Appearance: He is well-developed.  Eyes:     Pupils: Pupils are equal, round, and reactive to light.  Pulmonary:     Effort: Pulmonary effort is normal.  Skin:    General: Skin is warm and dry.  Neurological:     Mental Status: He is alert and oriented to person, place, and time.  Psychiatric:        Behavior: Behavior normal.     Ortho Exam  left knee was not hot red warm or swollen.  Better since Euflexxa injection last week  Specialty Comments:  No specialty comments available.  Imaging: No results found.   PMFS History: Patient Active Problem List   Diagnosis Date Noted  . Pain in right hip 02/07/2018  . Bilateral primary osteoarthritis of knee 02/07/2018  . Carpal tunnel syndrome of left wrist 12/28/2016  . Cubital tunnel syndrome on left 12/28/2016  . Trigger ring finger of right hand 12/14/2016  . Chronic maxillary sinusitis 08/05/2016  . Noise effect on both inner ears 08/05/2016  . Presbycusis of both ears 08/05/2016  . Dermatitis 10/23/2015  . Primary osteoarthritis of right hip 11/26/2014  . Severe obesity (BMI >= 40) ([place]) 11/26/2014  . S/P total hip arthroplasty 11/26/2014  . Acute cystitis without hematuria 11/03/2014  . Varicose veins of lower extremities with other complications 07/62/2633  . Elevated  prostate specific antigen (PSA) 01/04/2011  . Cancer of prostate ([place]) 11/22/2010   Past Medical History:  Diagnosis Date  . Bruises easily    both hands  . Cancer [facility]) 2005   prostate cancer treated with external beam radiation and radioactive seeds  . Cataracts, bilateral    immature  . Chronic kidney disease   . Complication of anesthesia    states he was given too much anesthesia during his knee replacement  . Constipation    takes Colace daily as needed  .  Decreased hearing   . Diverticulitis 2003  . DJD (degenerative joint disease)    thumbs and knees  . History of colon polyps    benign  . History of gout   . Joint pain   . Joint swelling   . Leg cramps    no meds  . Peripheral edema    takes Maxzide daily  . Pneumonia    hx of-15+yrs ago  . Ringing in ears   . UTI (lower urinary tract infection)    just completed antibiotic on 11/14/14    Family History  Problem Relation Age of Onset  . Kidney disease Mother     Past Surgical History:  Procedure Laterality Date  . APPENDECTOMY  1941  . CATARACT EXTRACTION W/PHACO Right 10/05/2017   Procedure: CATARACT EXTRACTION PHACO AND INTRAOCULAR LENS PLACEMENT ([place]) RIGHT;  Surgeon: Leandrew Koyanagi, MD;  Location: [place];  Service: Ophthalmology;  Laterality: Right;  requests arrival time to be after 10am  . CATARACT EXTRACTION W/PHACO Left 10/26/2017   Procedure: CATARACT EXTRACTION PHACO AND INTRAOCULAR LENS PLACEMENT ([place]) LEFT;  Surgeon: Leandrew Koyanagi, MD;  Location: [place];  Service: Ophthalmology;  Laterality: Left;  . COLONOSCOPY    . ENDOVENOUS ABLATION SAPHENOUS VEIN W/ LASER  12-01-2011   left greater saphenous vein by Curt Jews MD   . ENDOVENOUS ABLATION SAPHENOUS VEIN W/ LASER  12-22-2011   right greater saphenous vein    by Curt Jews MD  . HAND SURGERY    . [place]  . PARTIAL COLECTOMY    . prostate seeds     . removal of chest wall tumor    . sigmoid colon resection    . TOTAL HIP ARTHROPLASTY Right 11/26/2014   Procedure: TOTAL HIP ARTHROPLASTY;  Surgeon: Garald Balding, MD;  Location: [place];  Service: Orthopedics;  Laterality: Right;  . TOTAL KNEE ARTHROPLASTY  08-2010   Social History   Occupational History  . Not on file  Tobacco Use  . Smoking status: Former Smoker    Packs/day: 1.00    Years: 12.00    Pack years: 12.00    Types: Cigarettes    Last attempt to quit: 07/21/1956    Years since  quitting: 61.6  . Smokeless tobacco: Never Used  . Tobacco comment: quick smoking 62yrs ago  Substance and Sexual Activity  . Alcohol use: No  . Drug use: No  . Sexual activity: Not Currently     Biagio Borg, PA-C   Note - This record has been created using [employer].  Chart creation errors have been sought, but may not always  have been located. Such creation errors do not reflect on  the standard of medical care.

## 2018-02-24 ENCOUNTER — Ambulatory Visit (INDEPENDENT_AMBULATORY_CARE_PROVIDER_SITE_OTHER): Payer: Medicare Other | Admitting: Orthopaedic Surgery

## 2018-03-16 ENCOUNTER — Ambulatory Visit (INDEPENDENT_AMBULATORY_CARE_PROVIDER_SITE_OTHER): Payer: Medicare Other | Admitting: Podiatry

## 2018-03-16 DIAGNOSIS — B351 Tinea unguium: Secondary | ICD-10-CM | POA: Diagnosis not present

## 2018-03-16 DIAGNOSIS — M79675 Pain in left toe(s): Secondary | ICD-10-CM | POA: Diagnosis not present

## 2018-03-16 DIAGNOSIS — M79674 Pain in right toe(s): Secondary | ICD-10-CM

## 2018-03-16 NOTE — Patient Instructions (Signed)

## 2018-03-27 ENCOUNTER — Encounter: Payer: Self-pay | Admitting: Podiatry

## 2018-03-27 NOTE — Progress Notes (Signed)
Subjective: Christopher [name] is a 85 y.o. y.o. male who presents for preventative foot care today with for routine care of painful, discolored, thick toenails and painful callus/corn which interfere with daily activities. Pain is aggravated when wearing enclosed shoe gear. Pain is relieved with periodic professional debridement.  Christopher Manes, MD is his PCP.   Current Outpatient Medications:  .  clotrimazole-betamethasone (LOTRISONE) cream, Apply topically 2 (two) times daily as needed (itching). , Disp: , Rfl:  .  docusate sodium (COLACE) 100 MG capsule, Take 100 mg by mouth daily as needed for mild constipation., Disp: , Rfl:  .  ketoconazole (NIZORAL) 2 % cream, , Disp: , Rfl:  .  OVER THE COUNTER MEDICATION, daily. Swedish bitters, 2 tsp, Disp: , Rfl:  .  tamsulosin (FLOMAX) 0.4 MG CAPS capsule, Take 0.4 mg by mouth daily., Disp: , Rfl: 11 .  triamterene-hydrochlorothiazide (MAXZIDE) 75-50 MG per tablet, Take 1 tablet by mouth daily., Disp: , Rfl:   Allergies  Allergen Reactions  . Chicken Allergy Anaphylaxis  . Other Anaphylaxis and Swelling    Chicken  . Penicillins Itching and Swelling  . Poultry Meal Swelling    Objective: Vascular Examination: Capillary refill time immediate x 10 digits.  Dorsalis pedis pulses faintly palpable b/l.  Posterior tibial pulses faintly palpable b/l.  No digital hair x 10 digits.  Skin temperature WNL b/l.  Edema b/l LE.  Dermatological Examination: Skin with normal turgor, texture and tone b/l.  Toenails 1-5 b/l discolored, thick, dystrophic with subungual debris and pain with palpation to nailbeds due to thickness of nails.  Musculoskeletal: Muscle strength 5/5 to all LE muscle groups  Neurological: Sensation intact with 10 gram monofilament.  Vibratory sensation intact b/l.  Assessment: 1. Painful onychomycosis toenails 1-5 b/l 2. Edema b/l LE  Plan: 1. Discuss diabetic foot care principles. Literature  dispensed. 2. Toenails 1-5 b/l were debrided in length and girth without iatrogenic bleeding. 3. Patient to continue soft, supportive shoe gear 4. Patient to report any pedal injuries to medical professional  5. Follow up 10 weeks.  6. Patient/POA to call should there be a concern in the interim.

## 2018-05-25 ENCOUNTER — Ambulatory Visit: Payer: Medicare Other | Admitting: Podiatry

## 2018-06-14 DIAGNOSIS — H353131 Nonexudative age-related macular degeneration, bilateral, early dry stage: Secondary | ICD-10-CM | POA: Diagnosis not present

## 2018-06-14 DIAGNOSIS — H1859 Other hereditary corneal dystrophies: Secondary | ICD-10-CM | POA: Diagnosis not present

## 2018-06-20 ENCOUNTER — Other Ambulatory Visit: Payer: Self-pay

## 2018-06-20 ENCOUNTER — Ambulatory Visit (INDEPENDENT_AMBULATORY_CARE_PROVIDER_SITE_OTHER): Payer: Medicare Other | Admitting: Podiatry

## 2018-06-20 ENCOUNTER — Encounter: Payer: Self-pay | Admitting: Podiatry

## 2018-06-20 VITALS — Temp 97.7°F

## 2018-06-20 DIAGNOSIS — M79675 Pain in left toe(s): Secondary | ICD-10-CM | POA: Diagnosis not present

## 2018-06-20 DIAGNOSIS — B351 Tinea unguium: Secondary | ICD-10-CM

## 2018-06-20 DIAGNOSIS — M79674 Pain in right toe(s): Secondary | ICD-10-CM | POA: Diagnosis not present

## 2018-06-20 NOTE — Patient Instructions (Signed)

## 2018-06-29 NOTE — Progress Notes (Signed)
Subjective:  Christopher [name] presents to clinic today with cc of  painful, thick, discolored, elongated toenails 1-5 b/l that become tender and cannot cut because of thickness. Pain is aggravated when wearing enclosed shoe gear.  He voices no new pedal problems on today's visit.   Current Outpatient Medications:  .  clotrimazole-betamethasone (LOTRISONE) cream, Apply topically 2 (two) times daily as needed (itching). , Disp: , Rfl:  .  docusate sodium (COLACE) 100 MG capsule, Take 100 mg by mouth daily as needed for mild constipation., Disp: , Rfl:  .  ketoconazole (NIZORAL) 2 % cream, , Disp: , Rfl:  .  OVER THE COUNTER MEDICATION, daily. Swedish bitters, 2 tsp, Disp: , Rfl:  .  tamsulosin (FLOMAX) 0.4 MG CAPS capsule, Take 0.4 mg by mouth daily., Disp: , Rfl: 11 .  triamterene-hydrochlorothiazide (MAXZIDE) 75-50 MG per tablet, Take 1 tablet by mouth daily., Disp: , Rfl:    Allergies  Allergen Reactions  . Chicken Allergy Anaphylaxis  . Other Anaphylaxis and Swelling    Chicken  . Penicillins Itching and Swelling  . Poultry Meal Swelling     Objective: Vitals:   06/20/18 1155  Temp: 97.7 F (36.5 C)    Physical Examination:  Vascular Examination: Capillary refill time immediate x 10 digits.  Faintly palpable DP/PT pulses b/l.  Digital hair absent b/l.  No edema noted b/l.  Skin temperature gradient WNL b/l.  Dermatological Examination: Skin with normal turgor, texture and tone b/l.  No open wounds b/l.  No interdigital macerations noted b/l.  Elongated, thick, discolored brittle toenails with subungual debris and pain on dorsal palpation of nailbeds 1-5 b/l.  Musculoskeletal Examination: Muscle strength 5/5 to all muscle groups b/l.  No pain, crepitus or joint discomfort with active/passive ROM.  Neurological Examination: Sensation intact 5/5 b/l with 10 gram monofilament.  Vibratory sensation intact b/l.  Proprioceptive sensation intact  b/l.  Assessment: Mycotic nail infection with pain 1-5 b/l  Plan: 1. Toenails 1-5 b/l were debrided in length and girth without iatrogenic laceration. 2.  Continue soft, supportive shoe gear daily. 3.  Report any pedal injuries to medical professional. 4.  Follow up 3 months. 5.  Patient/POA to call should there be a question/concern in there interim.

## 2018-07-06 DIAGNOSIS — L821 Other seborrheic keratosis: Secondary | ICD-10-CM | POA: Diagnosis not present

## 2018-07-06 DIAGNOSIS — L570 Actinic keratosis: Secondary | ICD-10-CM | POA: Diagnosis not present

## 2018-07-06 DIAGNOSIS — Z85828 Personal history of other malignant neoplasm of skin: Secondary | ICD-10-CM | POA: Diagnosis not present

## 2018-07-06 DIAGNOSIS — D220 Melanocytic nevi of lip: Secondary | ICD-10-CM | POA: Diagnosis not present

## 2018-07-06 DIAGNOSIS — L720 Epidermal cyst: Secondary | ICD-10-CM | POA: Diagnosis not present

## 2018-07-06 DIAGNOSIS — D1801 Hemangioma of skin and subcutaneous tissue: Secondary | ICD-10-CM | POA: Diagnosis not present

## 2018-07-06 DIAGNOSIS — D692 Other nonthrombocytopenic purpura: Secondary | ICD-10-CM | POA: Diagnosis not present

## 2018-08-11 DIAGNOSIS — N183 Chronic kidney disease, stage 3 (moderate): Secondary | ICD-10-CM | POA: Diagnosis not present

## 2018-08-11 DIAGNOSIS — Z1331 Encounter for screening for depression: Secondary | ICD-10-CM | POA: Diagnosis not present

## 2018-08-11 DIAGNOSIS — Z79899 Other long term (current) drug therapy: Secondary | ICD-10-CM | POA: Diagnosis not present

## 2018-08-11 DIAGNOSIS — Z6841 Body Mass Index (BMI) 40.0 and over, adult: Secondary | ICD-10-CM | POA: Diagnosis not present

## 2018-08-11 DIAGNOSIS — Z0000 Encounter for general adult medical examination without abnormal findings: Secondary | ICD-10-CM | POA: Diagnosis not present

## 2018-08-11 DIAGNOSIS — D692 Other nonthrombocytopenic purpura: Secondary | ICD-10-CM | POA: Diagnosis not present

## 2018-08-11 DIAGNOSIS — I872 Venous insufficiency (chronic) (peripheral): Secondary | ICD-10-CM | POA: Diagnosis not present

## 2018-08-11 DIAGNOSIS — I700 Atherosclerosis of aorta: Secondary | ICD-10-CM | POA: Diagnosis not present

## 2018-08-29 ENCOUNTER — Encounter: Payer: Self-pay | Admitting: Podiatry

## 2018-08-29 ENCOUNTER — Ambulatory Visit (INDEPENDENT_AMBULATORY_CARE_PROVIDER_SITE_OTHER): Payer: Medicare Other | Admitting: Podiatry

## 2018-08-29 ENCOUNTER — Other Ambulatory Visit: Payer: Self-pay

## 2018-08-29 VITALS — Temp 98.0°F

## 2018-08-29 DIAGNOSIS — M79675 Pain in left toe(s): Secondary | ICD-10-CM | POA: Diagnosis not present

## 2018-08-29 DIAGNOSIS — M79674 Pain in right toe(s): Secondary | ICD-10-CM | POA: Diagnosis not present

## 2018-08-29 DIAGNOSIS — B351 Tinea unguium: Secondary | ICD-10-CM | POA: Diagnosis not present

## 2018-08-29 NOTE — Patient Instructions (Signed)

## 2018-09-07 NOTE — Progress Notes (Signed)
Subjective:  Christopher [name] presents to clinic today for management of painful, mycotic toenails.  Mr. Ferrando cannot cut because of thickness. Pain is aggravated when wearing enclosed shoe gear.  He relates he is doing well, he has had no problems during the current pandemic.   [name], MD is his PCP.   Current Outpatient Medications:  .  clotrimazole-betamethasone (LOTRISONE) cream, Apply topically 2 (two) times daily as needed (itching). , Disp: , Rfl:  .  docusate sodium (COLACE) 100 MG capsule, Take 100 mg by mouth daily as needed for mild constipation., Disp: , Rfl:  .  ketoconazole (NIZORAL) 2 % cream, , Disp: , Rfl:  .  OVER THE COUNTER MEDICATION, daily. Swedish bitters, 2 tsp, Disp: , Rfl:  .  tamsulosin (FLOMAX) 0.4 MG CAPS capsule, Take 0.4 mg by mouth daily., Disp: , Rfl: 11 .  triamterene-hydrochlorothiazide (MAXZIDE) 75-50 MG per tablet, Take 1 tablet by mouth daily., Disp: , Rfl:    Allergies  Allergen Reactions  . Chicken Allergy Anaphylaxis  . Other Anaphylaxis and Swelling    Chicken  . Penicillins Itching and Swelling  . Poultry Meal Swelling     Objective: Vitals:   08/29/18 1121  Temp: 98 F (36.7 C)    Physical Examination:  Vascular Examination: Capillary refill time immediate x 10 digits.  Palpable DP/PT pulses b/l.  Digital hair is noted to be absent bilaterally.  Bilateral lower extremity edema noted.  No blisters, no weeping, no warmth, no erythema.  Skin temperature gradient WNL b/l.  Dermatological Examination: Skin with normal turgor, texture and tone b/l.  No open wounds b/l.  No interdigital macerations noted b/l.  Elongated, thick, discolored brittle toenails with subungual debris and pain on dorsal palpation of nailbeds 1-5 b/l.  Musculoskeletal Examination: Muscle strength 5/5 to all muscle groups b/l.  No pain, crepitus or joint discomfort with active/passive ROM.  Neurological Examination: Sensation intact 5/5 b/l  with 10 gram monofilament.  Vibratory sensation intact b/l.  Proprioceptive sensation intact b/l.  Assessment: Mycotic nail infection with pain 1-5 b/l  Plan: 1. Toenails 1-5 b/l were debrided in length and girth without iatrogenic laceration. 2.  Continue soft, supportive shoe gear daily. 3.  Report any pedal injuries to medical professional. 4.  Follow up 3 months. 5.  Patient/POA to call should there be a question/concern in there interim.

## 2018-11-10 ENCOUNTER — Other Ambulatory Visit: Payer: Self-pay

## 2018-11-10 ENCOUNTER — Ambulatory Visit (INDEPENDENT_AMBULATORY_CARE_PROVIDER_SITE_OTHER): Payer: Medicare Other | Admitting: Podiatry

## 2018-11-10 ENCOUNTER — Encounter: Payer: Self-pay | Admitting: Podiatry

## 2018-11-10 DIAGNOSIS — M79675 Pain in left toe(s): Secondary | ICD-10-CM

## 2018-11-10 DIAGNOSIS — B351 Tinea unguium: Secondary | ICD-10-CM | POA: Diagnosis not present

## 2018-11-10 DIAGNOSIS — M79674 Pain in right toe(s): Secondary | ICD-10-CM | POA: Diagnosis not present

## 2018-11-11 NOTE — Progress Notes (Signed)
Subjective: Christopher [name] presents today with painful, thick toenails 1-5 b/l that he cannot cut and which interfere with daily activities.  Pain is aggravated when wearing enclosed shoe gear.  He voices no new pedal problems on today's visit.  Current Outpatient Medications on File Prior to Visit  Medication Sig Dispense Refill  . clotrimazole-betamethasone (LOTRISONE) cream Apply topically 2 (two) times daily as needed (itching).     Marland Kitchen docusate sodium (COLACE) 100 MG capsule Take 100 mg by mouth daily as needed for mild constipation.    Marland Kitchen ketoconazole (NIZORAL) 2 % cream     . OVER THE COUNTER MEDICATION daily. Swedish bitters, 2 tsp    . tamsulosin (FLOMAX) 0.4 MG CAPS capsule Take 0.4 mg by mouth daily.  11  . triamterene-hydrochlorothiazide (MAXZIDE) 75-50 MG per tablet Take 1 tablet by mouth daily.     No current facility-administered medications on file prior to visit.     Allergies  Allergen Reactions  . Chicken Allergy Anaphylaxis  . Other Anaphylaxis and Swelling    Chicken  . Penicillins Itching and Swelling  . Poultry Meal Swelling    Objective:  Vascular Examination: Capillary refill time immediate x 10 digits  Dorsalis pedis and Posterior tibial pulses palpable b/l.  Digital hair absent b/l.   Skin temperature gradient WNL b/l.  Chronic b/l LE edema b/l legs. No blisters, no weeping, no erythema, no warmth. No pain with calf compression.   Dermatological Examination: Skin with normal turgor, texture and tone b/l.  Toenails 1-5 b/l discolored, thick, dystrophic with subungual debris and pain with palpation to nailbeds due to thickness of nails.  Musculoskeletal: Muscle strength 5/5 to all LE muscle groups b/l.   No gross bony deformities b/l.  No pain, crepitus or joint limitation noted with ROM.   Neurological: Sensation intact 5/5 b/l with 10 gram monofilament.  Vibratory sensation intact b/l. Assessment: Painful onychomycosis toenails 1-5 b/l    Plan: 1. Toenails 1-5 b/l were debrided in length and girth without iatrogenic bleeding. 2. Patient to continue soft, supportive shoe gear daily. 3. Patient to report any pedal injuries to medical professional immediately. 4. Follow up 10 weeks. 5. Patient/POA to call should there be a concern in the interim.

## 2018-11-24 DIAGNOSIS — S81811A Laceration without foreign body, right lower leg, initial encounter: Secondary | ICD-10-CM | POA: Diagnosis not present

## 2018-11-24 DIAGNOSIS — Z6841 Body Mass Index (BMI) 40.0 and over, adult: Secondary | ICD-10-CM | POA: Diagnosis not present

## 2018-11-24 DIAGNOSIS — Z23 Encounter for immunization: Secondary | ICD-10-CM | POA: Diagnosis not present

## 2018-11-24 DIAGNOSIS — I129 Hypertensive chronic kidney disease with stage 1 through stage 4 chronic kidney disease, or unspecified chronic kidney disease: Secondary | ICD-10-CM | POA: Diagnosis not present

## 2018-11-24 DIAGNOSIS — N1832 Chronic kidney disease, stage 3b: Secondary | ICD-10-CM | POA: Diagnosis not present

## 2018-12-11 DIAGNOSIS — Z961 Presence of intraocular lens: Secondary | ICD-10-CM | POA: Diagnosis not present

## 2019-01-26 ENCOUNTER — Ambulatory Visit: Payer: Medicare Other | Admitting: Podiatry

## 2019-01-29 DIAGNOSIS — D1801 Hemangioma of skin and subcutaneous tissue: Secondary | ICD-10-CM | POA: Diagnosis not present

## 2019-01-29 DIAGNOSIS — L821 Other seborrheic keratosis: Secondary | ICD-10-CM | POA: Diagnosis not present

## 2019-01-29 DIAGNOSIS — L570 Actinic keratosis: Secondary | ICD-10-CM | POA: Diagnosis not present

## 2019-01-29 DIAGNOSIS — Z85828 Personal history of other malignant neoplasm of skin: Secondary | ICD-10-CM | POA: Diagnosis not present

## 2019-02-07 DIAGNOSIS — N1832 Chronic kidney disease, stage 3b: Secondary | ICD-10-CM | POA: Diagnosis not present

## 2019-02-07 DIAGNOSIS — I129 Hypertensive chronic kidney disease with stage 1 through stage 4 chronic kidney disease, or unspecified chronic kidney disease: Secondary | ICD-10-CM | POA: Diagnosis not present

## 2019-02-08 ENCOUNTER — Ambulatory Visit: Payer: Medicare Other

## 2019-02-15 ENCOUNTER — Ambulatory Visit: Payer: Medicare Other | Attending: Internal Medicine

## 2019-02-15 DIAGNOSIS — Z23 Encounter for immunization: Secondary | ICD-10-CM | POA: Insufficient documentation

## 2019-02-15 NOTE — Progress Notes (Signed)
   Covid-19 Vaccination Clinic  Name:  Kison Story    MRN: [id number] DOB: 01-13-1928  02/15/2019  Mr. Poehlman was observed post Covid-19 immunization for 30 minutes based on pre-vaccination screening without incidence. He was provided with Vaccine Information Sheet and instruction to access the V-Safe system.   Mr. Piech was instructed to call 911 with any severe reactions post vaccine: Marland Kitchen Difficulty breathing  . Swelling of your face and throat  . A fast heartbeat  . A bad rash all over your body  . Dizziness and weakness    Immunizations Administered    Name Date Dose VIS Date Route   Pfizer COVID-19 Vaccine 02/15/2019  1:32 PM 0.3 mL 12/22/2018 Intramuscular   Manufacturer: [place]   Lot: [id number]   [place]: [id number]

## 2019-03-12 ENCOUNTER — Ambulatory Visit: Payer: Medicare Other | Attending: Internal Medicine

## 2019-03-12 DIAGNOSIS — Z23 Encounter for immunization: Secondary | ICD-10-CM

## 2019-03-12 NOTE — Progress Notes (Signed)
   Covid-19 Vaccination Clinic  Name:  Christopher [name]    MRN: [id number] DOB: 1928/09/19  03/12/2019  [name] was observed post Covid-19 immunization for 15 minutes without incidence. He was provided with Vaccine Information Sheet and instruction to access the V-Safe system.   [name] was instructed to call 911 with any severe reactions post vaccine: Marland Kitchen Difficulty breathing  . Swelling of your face and throat  . A fast heartbeat  . A bad rash all over your body  . Dizziness and weakness    Immunizations Administered    Name Date Dose VIS Date Route   Pfizer COVID-19 Vaccine 03/12/2019  3:31 PM 0.3 mL 12/22/2018 Intramuscular   Manufacturer: [place]   Lot: [id number]   [place]: [id number]

## 2019-03-19 ENCOUNTER — Telehealth: Payer: Self-pay | Admitting: Orthopaedic Surgery

## 2019-03-22 ENCOUNTER — Other Ambulatory Visit: Payer: Self-pay

## 2019-03-22 ENCOUNTER — Ambulatory Visit: Payer: Self-pay

## 2019-03-22 ENCOUNTER — Ambulatory Visit (INDEPENDENT_AMBULATORY_CARE_PROVIDER_SITE_OTHER): Payer: Medicare Other | Admitting: Orthopaedic Surgery

## 2019-03-22 ENCOUNTER — Ambulatory Visit (INDEPENDENT_AMBULATORY_CARE_PROVIDER_SITE_OTHER): Payer: Medicare Other

## 2019-03-22 ENCOUNTER — Encounter: Payer: Self-pay | Admitting: Orthopaedic Surgery

## 2019-03-22 VITALS — Ht 68.0 in | Wt 258.0 lb

## 2019-03-22 DIAGNOSIS — M25551 Pain in right hip: Secondary | ICD-10-CM

## 2019-03-22 NOTE — Progress Notes (Signed)
Office Visit Note   Patient: Christopher [name]           Date of Birth: Jan 14, 1928           MRN: [id number] Visit Date: 03/22/2019              Requested by: [name], MD [address] [place],  [place] [address] PCP: [name], MD   Assessment & Plan: Visit Diagnoses:  1. Pain in right hip     Plan:  #1: At this time we believe that his symptoms are probably based on the Covid second injection.  We will see how he does over the next several days to week.  If his symptoms worsen then he can return otherwise we will follow back up as needed.  Follow-Up Instructions: No follow-ups on file.   Orders:  Orders Placed This Encounter  Procedures  . XR HIP UNILAT W OR W/O PELVIS 2-3 VIEWS RIGHT   No orders of the defined types were placed in this encounter.     Procedures: No procedures performed   Clinical Data: No additional findings.   Subjective: Chief Complaint  Patient presents with  . Right Hip - Pain   HPI Patient presents today for recurrent right hip pain. He received his first covid vaccine at the beginning of February and states that his right hip hurt for a day afterwards. The pain then went away. He received the second covid vaccine on March 1st and states that the pain returned. He has been hurting very badly for a week. His pain is located on his lateral hip. No groin pain. He has taken Tylenol Extra Strength. He only has pain with weightbearing. He has a history of a right total knee arthroplasty in 2016.   Review of Systems unchanged  Objective: Vital Signs: Ht 5\' 8"  (1.727 m)   Wt 258 lb (117 kg)   BMI 39.23 kg/m   Physical Exam Constitutional:      Appearance: He is well-developed.  Eyes:     Pupils: Pupils are equal, round, and reactive to light.  Pulmonary:     Effort: Pulmonary effort is normal.  Skin:    General: Skin is warm and dry.  Neurological:     Mental Status: He is alert and oriented to person, place,  and time.  Psychiatric:        Behavior: Behavior normal.     Ortho Exam  He is tender to palpation over the right trochanteric region as well as over the right SI area.  He does not really have particular pain with internal or external rotation or flexion.  When he does weight-bear it does cause pain into his posterior and lateral aspect of his hip.  Smooth motion without crepitance.  Negative straight leg raising.  Specialty Comments:  No specialty comments available.  Imaging: No results found.   PMFS History: Patient Active Problem List   Diagnosis Date Noted  . Pain in right hip 02/07/2018  . Bilateral primary osteoarthritis of knee 02/07/2018  . Carpal tunnel syndrome of left wrist 12/28/2016  . Cubital tunnel syndrome on left 12/28/2016  . Trigger ring finger of right hand 12/14/2016  . Chronic maxillary sinusitis 08/05/2016  . Noise effect on both inner ears 08/05/2016  . Presbycusis of both ears 08/05/2016  . Dermatitis 10/23/2015  . Primary osteoarthritis of right hip 11/26/2014  . Severe obesity (BMI >= 40) ([place]) 11/26/2014  . S/P total hip arthroplasty 11/26/2014  .  Acute cystitis without hematuria 11/03/2014  . Varicose veins of lower extremities with other complications 99991111  . Elevated prostate specific antigen (PSA) 01/04/2011  . Cancer of prostate ([place]) 11/22/2010   Past Medical History:  Diagnosis Date  . Bruises easily    both hands  . Cancer [facility]) 2005   prostate cancer treated with external beam radiation and radioactive seeds  . Cataracts, bilateral    immature  . Chronic kidney disease   . Complication of anesthesia    states he was given too much anesthesia during his knee replacement  . Constipation    takes Colace daily as needed  . Decreased hearing   . Diverticulitis 2003  . DJD (degenerative joint disease)    thumbs and knees  . History of colon polyps    benign  . History of gout   . Joint pain   . Joint swelling   . Leg  cramps    no meds  . Peripheral edema    takes Maxzide daily  . Pneumonia    hx of-15+yrs ago  . Ringing in ears   . UTI (lower urinary tract infection)    just completed antibiotic on 11/14/14    Family History  Problem Relation Age of Onset  . Kidney disease Mother     Past Surgical History:  Procedure Laterality Date  . APPENDECTOMY  1941  . CATARACT EXTRACTION W/PHACO Right 10/05/2017   Procedure: CATARACT EXTRACTION PHACO AND INTRAOCULAR LENS PLACEMENT ([place]) RIGHT;  Surgeon: Leandrew Koyanagi, MD;  Location: [place];  Service: Ophthalmology;  Laterality: Right;  requests arrival time to be after 10am  . CATARACT EXTRACTION W/PHACO Left 10/26/2017   Procedure: CATARACT EXTRACTION PHACO AND INTRAOCULAR LENS PLACEMENT ([place]) LEFT;  Surgeon: Leandrew Koyanagi, MD;  Location: [place];  Service: Ophthalmology;  Laterality: Left;  . COLONOSCOPY    . ENDOVENOUS ABLATION SAPHENOUS VEIN W/ LASER  12-01-2011   left greater saphenous vein by Curt Jews MD   . ENDOVENOUS ABLATION SAPHENOUS VEIN W/ LASER  12-22-2011   right greater saphenous vein    by Curt Jews MD  . HAND SURGERY    . [place]  . PARTIAL COLECTOMY    . prostate seeds     . removal of chest wall tumor    . sigmoid colon resection    . TOTAL HIP ARTHROPLASTY Right 11/26/2014   Procedure: TOTAL HIP ARTHROPLASTY;  Surgeon: Garald Balding, MD;  Location: [facility];  Service: Orthopedics;  Laterality: Right;  . TOTAL KNEE ARTHROPLASTY  08-2010   Social History   Occupational History  . Not on file  Tobacco Use  . Smoking status: Former Smoker    Packs/day: 1.00    Years: 12.00    Pack years: 12.00    Types: Cigarettes    Quit date: 07/21/1956    Years since quitting: 62.7  . Smokeless tobacco: Never Used  . Tobacco comment: quick smoking 20yrs ago  Substance and Sexual Activity  . Alcohol use: No  . Drug use: No  . Sexual activity: Not Currently

## 2019-03-23 ENCOUNTER — Other Ambulatory Visit: Payer: Self-pay

## 2019-03-23 ENCOUNTER — Encounter: Payer: Self-pay | Admitting: Podiatry

## 2019-03-23 ENCOUNTER — Ambulatory Visit (INDEPENDENT_AMBULATORY_CARE_PROVIDER_SITE_OTHER): Payer: Medicare Other | Admitting: Podiatry

## 2019-03-23 DIAGNOSIS — B351 Tinea unguium: Secondary | ICD-10-CM | POA: Diagnosis not present

## 2019-03-23 DIAGNOSIS — M79675 Pain in left toe(s): Secondary | ICD-10-CM

## 2019-03-23 DIAGNOSIS — M79674 Pain in right toe(s): Secondary | ICD-10-CM | POA: Diagnosis not present

## 2019-03-23 NOTE — Progress Notes (Signed)
Subjective: Christopher [name] presents today for follow up of painful mycotic nails b/l that are difficult to trim. Pain interferes with ambulation. Aggravating factors include wearing enclosed shoe gear. Pain is relieved with periodic professional debridement.   Christopher [name] relates he received his 2nd COVID vaccine and experienced a little hip pain which has resolved. He voices no new pedal problems on today's visit.  Allergies  Allergen Reactions  . Chicken Allergy Anaphylaxis  . Other Anaphylaxis and Swelling    Chicken  . Penicillins Itching and Swelling  . Poultry Meal Swelling     Objective: There were no vitals filed for this visit.  Pt 86 y.o. year old male  in NAD. AAO x 3.   Vascular Examination:  Capillary refill time to digits immediate b/l. Palpable DP pulses b/l. Palpable PT pulses b/l. Pedal hair absent b/l Skin temperature gradient within normal limits b/l. Nonpitting edema noted b/l LE. No pain with calf compression b/l.  Dermatological Examination: No open wounds bilaterally. No interdigital macerations bilaterally. Toenails 1-5 b/l elongated, dystrophic, thickened, crumbly with subungual debris and tenderness to dorsal palpation. Evidence of chronic venous insufficiency b/l LE.  Musculoskeletal: Normal muscle strength 5/5 to all lower extremity muscle groups bilaterally, no gross bony deformities bilaterally, no pain crepitus or joint limitation noted with ROM b/l and utilizes cane for ambulation assistance  Neurological: Protective sensation intact 5/5 intact bilaterally with 10g monofilament b/l Vibratory sensation intact b/l  Assessment: No diagnosis found.  Plan: -Toenails 1-5 b/l were debrided in length and girth with sterile nail nippers and dremel without iatrogenic bleeding.  -Patient to continue soft, supportive shoe gear daily. -Patient to report any pedal injuries to medical professional immediately. -Patient/POA to call should there be question/concern  in the interim.  Return in about 10 weeks (around 06/01/2019) for nail trim.

## 2019-03-23 NOTE — Patient Instructions (Signed)

## 2019-05-03 DIAGNOSIS — G5622 Lesion of ulnar nerve, left upper limb: Secondary | ICD-10-CM | POA: Diagnosis not present

## 2019-05-03 DIAGNOSIS — G5601 Carpal tunnel syndrome, right upper limb: Secondary | ICD-10-CM | POA: Diagnosis not present

## 2019-05-03 DIAGNOSIS — G5621 Lesion of ulnar nerve, right upper limb: Secondary | ICD-10-CM | POA: Diagnosis not present

## 2019-05-03 DIAGNOSIS — G5602 Carpal tunnel syndrome, left upper limb: Secondary | ICD-10-CM | POA: Diagnosis not present

## 2019-06-04 ENCOUNTER — Ambulatory Visit (INDEPENDENT_AMBULATORY_CARE_PROVIDER_SITE_OTHER): Payer: Medicare Other | Admitting: Podiatry

## 2019-06-04 ENCOUNTER — Other Ambulatory Visit: Payer: Self-pay

## 2019-06-04 ENCOUNTER — Encounter: Payer: Self-pay | Admitting: Podiatry

## 2019-06-04 DIAGNOSIS — M79675 Pain in left toe(s): Secondary | ICD-10-CM

## 2019-06-04 DIAGNOSIS — M79674 Pain in right toe(s): Secondary | ICD-10-CM | POA: Diagnosis not present

## 2019-06-04 DIAGNOSIS — B351 Tinea unguium: Secondary | ICD-10-CM

## 2019-06-04 NOTE — Patient Instructions (Signed)

## 2019-06-08 DIAGNOSIS — N1832 Chronic kidney disease, stage 3b: Secondary | ICD-10-CM | POA: Diagnosis not present

## 2019-06-08 DIAGNOSIS — I129 Hypertensive chronic kidney disease with stage 1 through stage 4 chronic kidney disease, or unspecified chronic kidney disease: Secondary | ICD-10-CM | POA: Diagnosis not present

## 2019-06-08 DIAGNOSIS — I700 Atherosclerosis of aorta: Secondary | ICD-10-CM | POA: Diagnosis not present

## 2019-06-08 DIAGNOSIS — D692 Other nonthrombocytopenic purpura: Secondary | ICD-10-CM | POA: Diagnosis not present

## 2019-06-08 DIAGNOSIS — Z6841 Body Mass Index (BMI) 40.0 and over, adult: Secondary | ICD-10-CM | POA: Diagnosis not present

## 2019-06-09 NOTE — Progress Notes (Signed)
Subjective: Christopher [name] is a 86 y.o. male patient seen today painful mycotic nails b/l that are difficult to trim. Pain interferes with ambulation. Aggravating factors include wearing enclosed shoe gear. Pain is relieved with periodic professional debridement  Patient Active Problem List   Diagnosis Date Noted  . Pain in right hip 02/07/2018  . Bilateral primary osteoarthritis of knee 02/07/2018  . Carpal tunnel syndrome of left wrist 12/28/2016  . Cubital tunnel syndrome on left 12/28/2016  . Trigger ring finger of right hand 12/14/2016  . Chronic maxillary sinusitis 08/05/2016  . Noise effect on both inner ears 08/05/2016  . Presbycusis of both ears 08/05/2016  . Dermatitis 10/23/2015  . Primary osteoarthritis of right hip 11/26/2014  . Severe obesity (BMI >= 40) ([place]) 11/26/2014  . S/P total hip arthroplasty 11/26/2014  . Acute cystitis without hematuria 11/03/2014  . Varicose veins of lower extremities with other complications 99991111  . Elevated prostate specific antigen (PSA) 01/04/2011  . Cancer of prostate ([place]) 11/22/2010    Current Outpatient Medications on File Prior to Visit  Medication Sig Dispense Refill  . clotrimazole-betamethasone (LOTRISONE) cream Apply topically 2 (two) times daily as needed (itching).     Marland Kitchen docusate sodium (COLACE) 100 MG capsule Take 100 mg by mouth daily as needed for mild constipation.    Marland Kitchen ketoconazole (NIZORAL) 2 % cream     . OVER THE COUNTER MEDICATION daily. Swedish bitters, 2 tsp    . tamsulosin (FLOMAX) 0.4 MG CAPS capsule Take 0.4 mg by mouth daily.  11  . triamterene-hydrochlorothiazide (MAXZIDE) 75-50 MG per tablet Take 1 tablet by mouth daily.     No current facility-administered medications on file prior to visit.    Allergies  Allergen Reactions  . Chicken Allergy Anaphylaxis  . Other Anaphylaxis and Swelling    Chicken  . Penicillins Itching and Swelling  . Poultry Meal Swelling    Objective: Physical  Exam  General: Christopher [name] is a pleasant 86 y.o. Caucasian male, in NAD. AAO x 3.   Vascular:  Capillary refill time to digits immediate b/l. Palpable DP pulses b/l. Palpable PT pulses b/l. Pedal hair present b/l. Skin temperature gradient within normal limits b/l. No pain with calf compression b/l. Evidence of chronic venous insufficiency b/l LE. Nonpitting edema noted b/l LE.  Dermatological:  Pedal skin with normal turgor, texture and tone bilaterally. No open wounds bilaterally. No interdigital macerations bilaterally. Toenails 1-5 b/l elongated, discolored, dystrophic, thickened, crumbly with subungual debris and tenderness to dorsal palpation. Evidence of chronic venous insufficiency b/l LE.  Musculoskeletal:  Normal muscle strength 5/5 to all lower extremity muscle groups bilaterally. No gross bony deformities bilaterally. No pain crepitus or joint limitation noted with ROM b/l.  Neurological:  Protective sensation intact 5/5 intact bilaterally with 10g monofilament b/l. Vibratory sensation intact b/l.  Assessment and Plan:  1. Pain due to onychomycosis of toenails of both feet    -Examined patient. -No new findings. No new orders. -Toenails 1-5 b/l were debrided in length and girth with sterile nail nippers and dremel without iatrogenic bleeding.  -Patient to continue soft, supportive shoe gear daily. -Patient to report any pedal injuries to medical professional immediately. -Patient/POA to call should there be question/concern in the interim.  Return in about 9 weeks (around 08/06/2019) for nail trim.  Marzetta Board, DPM

## 2019-07-30 DIAGNOSIS — Z85828 Personal history of other malignant neoplasm of skin: Secondary | ICD-10-CM | POA: Diagnosis not present

## 2019-07-30 DIAGNOSIS — D485 Neoplasm of uncertain behavior of skin: Secondary | ICD-10-CM | POA: Diagnosis not present

## 2019-07-30 DIAGNOSIS — L570 Actinic keratosis: Secondary | ICD-10-CM | POA: Diagnosis not present

## 2019-07-30 DIAGNOSIS — D234 Other benign neoplasm of skin of scalp and neck: Secondary | ICD-10-CM | POA: Diagnosis not present

## 2019-07-30 DIAGNOSIS — D1801 Hemangioma of skin and subcutaneous tissue: Secondary | ICD-10-CM | POA: Diagnosis not present

## 2019-07-30 DIAGNOSIS — L918 Other hypertrophic disorders of the skin: Secondary | ICD-10-CM | POA: Diagnosis not present

## 2019-07-30 DIAGNOSIS — L821 Other seborrheic keratosis: Secondary | ICD-10-CM | POA: Diagnosis not present

## 2019-07-30 DIAGNOSIS — D692 Other nonthrombocytopenic purpura: Secondary | ICD-10-CM | POA: Diagnosis not present

## 2019-08-10 ENCOUNTER — Encounter: Payer: Self-pay | Admitting: Podiatry

## 2019-08-10 ENCOUNTER — Other Ambulatory Visit: Payer: Self-pay

## 2019-08-10 ENCOUNTER — Ambulatory Visit (INDEPENDENT_AMBULATORY_CARE_PROVIDER_SITE_OTHER): Payer: Medicare Other | Admitting: Podiatry

## 2019-08-10 DIAGNOSIS — M79675 Pain in left toe(s): Secondary | ICD-10-CM | POA: Diagnosis not present

## 2019-08-10 DIAGNOSIS — B351 Tinea unguium: Secondary | ICD-10-CM

## 2019-08-10 DIAGNOSIS — M79674 Pain in right toe(s): Secondary | ICD-10-CM | POA: Diagnosis not present

## 2019-08-11 NOTE — Progress Notes (Signed)
Subjective: Christopher [name] is a 86 y.o. male patient seen today painful mycotic nails b/l that are difficult to trim. Pain interferes with ambulation. Aggravating factors include wearing enclosed shoe gear. Pain is relieved with periodic professional debridement.  [name] voices no new pedal concerns on today's visit.  He states he does use compression hose for his lower extremity edema.  Patient Active Problem List   Diagnosis Date Noted  . Pain in right hip 02/07/2018  . Bilateral primary osteoarthritis of knee 02/07/2018  . Carpal tunnel syndrome of left wrist 12/28/2016  . Cubital tunnel syndrome on left 12/28/2016  . Trigger ring finger of right hand 12/14/2016  . Chronic maxillary sinusitis 08/05/2016  . Noise effect on both inner ears 08/05/2016  . Presbycusis of both ears 08/05/2016  . Dermatitis 10/23/2015  . Primary osteoarthritis of right hip 11/26/2014  . Severe obesity (BMI >= 40) ([place]) 11/26/2014  . S/P total hip arthroplasty 11/26/2014  . Acute cystitis without hematuria 11/03/2014  . Varicose veins of lower extremities with other complications 07/11/1599  . Elevated prostate specific antigen (PSA) 01/04/2011  . Cancer of prostate ([place]) 11/22/2010    Current Outpatient Medications on File Prior to Visit  Medication Sig Dispense Refill  . clotrimazole-betamethasone (LOTRISONE) cream Apply topically 2 (two) times daily as needed (itching).     Marland Kitchen docusate sodium (COLACE) 100 MG capsule Take 100 mg by mouth daily as needed for mild constipation.    Marland Kitchen ketoconazole (NIZORAL) 2 % cream     . OVER THE COUNTER MEDICATION daily. Swedish bitters, 2 tsp    . predniSONE (DELTASONE) 10 MG tablet Take by mouth.    . tamsulosin (FLOMAX) 0.4 MG CAPS capsule Take 0.4 mg by mouth daily.  11  . triamterene-hydrochlorothiazide (MAXZIDE) 75-50 MG per tablet Take 1 tablet by mouth daily.     No current facility-administered medications on file prior to visit.    Allergies  Allergen  Reactions  . Chicken Allergy Anaphylaxis  . Other Anaphylaxis and Swelling    Chicken  . Penicillins Itching and Swelling  . Poultry Meal Swelling    Objective: Physical Exam  General: Christopher [name] is a pleasant 86 y.o. Caucasian male, morbidly obese, in NAD. AAO x 3.   Vascular:  Capillary refill time to digits immediate b/l. Palpable DP pulses b/l. Palpable PT pulses b/l. Pedal hair present b/l. Skin temperature gradient within normal limits b/l. No pain with calf compression b/l. Evidence of chronic venous insufficiency b/l LE. Nonpitting edema noted b/l LE.  Dermatological:  Pedal skin with normal turgor, texture and tone bilaterally. No open wounds bilaterally. No interdigital macerations bilaterally. Toenails 1-5 b/l elongated, discolored, dystrophic, thickened, crumbly with subungual debris and tenderness to dorsal palpation. Evidence of chronic venous insufficiency b/l LE.  Musculoskeletal:  Normal muscle strength 5/5 to all lower extremity muscle groups bilaterally. No gross bony deformities bilaterally. No pain crepitus or joint limitation noted with ROM b/l.  Neurological:  Protective sensation intact 5/5 intact bilaterally with 10g monofilament b/l. Vibratory sensation intact b/l.  Assessment and Plan:  1. Pain due to onychomycosis of toenails of both feet    -Examined patient. -No new findings. No new orders. -Toenails 1-5 b/l were debrided in length and girth with sterile nail nippers and dremel without iatrogenic bleeding.  -Patient to continue soft, supportive shoe gear daily. -Patient to report any pedal injuries to medical professional immediately. -Patient/POA to call should there be question/concern in the interim.  Return in about  3 months (around 11/10/2019) for nail trim.  Marzetta Board, DPM

## 2019-08-21 DIAGNOSIS — C61 Malignant neoplasm of prostate: Secondary | ICD-10-CM | POA: Diagnosis not present

## 2019-08-22 DIAGNOSIS — N1832 Chronic kidney disease, stage 3b: Secondary | ICD-10-CM | POA: Diagnosis not present

## 2019-08-22 DIAGNOSIS — Z1389 Encounter for screening for other disorder: Secondary | ICD-10-CM | POA: Diagnosis not present

## 2019-08-22 DIAGNOSIS — Z0000 Encounter for general adult medical examination without abnormal findings: Secondary | ICD-10-CM | POA: Diagnosis not present

## 2019-08-22 DIAGNOSIS — I129 Hypertensive chronic kidney disease with stage 1 through stage 4 chronic kidney disease, or unspecified chronic kidney disease: Secondary | ICD-10-CM | POA: Diagnosis not present

## 2019-08-22 DIAGNOSIS — J301 Allergic rhinitis due to pollen: Secondary | ICD-10-CM | POA: Diagnosis not present

## 2019-09-05 DIAGNOSIS — H353131 Nonexudative age-related macular degeneration, bilateral, early dry stage: Secondary | ICD-10-CM | POA: Diagnosis not present

## 2019-09-24 DIAGNOSIS — I129 Hypertensive chronic kidney disease with stage 1 through stage 4 chronic kidney disease, or unspecified chronic kidney disease: Secondary | ICD-10-CM | POA: Diagnosis not present

## 2019-09-24 DIAGNOSIS — N1832 Chronic kidney disease, stage 3b: Secondary | ICD-10-CM | POA: Diagnosis not present

## 2019-09-24 DIAGNOSIS — R300 Dysuria: Secondary | ICD-10-CM | POA: Diagnosis not present

## 2019-11-16 ENCOUNTER — Other Ambulatory Visit: Payer: Self-pay

## 2019-11-16 ENCOUNTER — Ambulatory Visit (INDEPENDENT_AMBULATORY_CARE_PROVIDER_SITE_OTHER): Payer: Medicare Other | Admitting: Podiatry

## 2019-11-16 ENCOUNTER — Encounter: Payer: Self-pay | Admitting: Podiatry

## 2019-11-16 DIAGNOSIS — M79675 Pain in left toe(s): Secondary | ICD-10-CM

## 2019-11-16 DIAGNOSIS — R600 Localized edema: Secondary | ICD-10-CM

## 2019-11-16 DIAGNOSIS — B351 Tinea unguium: Secondary | ICD-10-CM | POA: Diagnosis not present

## 2019-11-16 DIAGNOSIS — M79674 Pain in right toe(s): Secondary | ICD-10-CM | POA: Diagnosis not present

## 2019-11-16 DIAGNOSIS — I872 Venous insufficiency (chronic) (peripheral): Secondary | ICD-10-CM

## 2019-11-18 NOTE — Progress Notes (Addendum)
Subjective: Christopher [name] is a 86 y.o. male patient seen today painful mycotic nails b/l that are difficult to trim. Pain interferes with ambulation. Aggravating factors include wearing enclosed shoe gear. Pain is relieved with periodic professional debridement.  Christopher [name] voices no new pedal concerns on today's visit.  He states he does use compression hose for his lower extremity edema.  Patient Active Problem List   Diagnosis Date Noted  . Pain in right hip 02/07/2018  . Bilateral primary osteoarthritis of knee 02/07/2018  . Carpal tunnel syndrome of left wrist 12/28/2016  . Cubital tunnel syndrome on left 12/28/2016  . Trigger ring finger of right hand 12/14/2016  . Chronic maxillary sinusitis 08/05/2016  . Noise effect on both inner ears 08/05/2016  . Presbycusis of both ears 08/05/2016  . Dermatitis 10/23/2015  . Primary osteoarthritis of right hip 11/26/2014  . Severe obesity (BMI >= 40) ([place]) 11/26/2014  . S/P total hip arthroplasty 11/26/2014  . Acute cystitis without hematuria 11/03/2014  . Varicose veins of lower extremities with other complications 25/95/6387  . Elevated prostate specific antigen (PSA) 01/04/2011  . Cancer of prostate ([place]) 11/22/2010    Current Outpatient Medications on File Prior to Visit  Medication Sig Dispense Refill  . clotrimazole-betamethasone (LOTRISONE) cream Apply topically 2 (two) times daily as needed (itching).     Marland Kitchen docusate sodium (COLACE) 100 MG capsule Take 100 mg by mouth daily as needed for mild constipation.    Marland Kitchen ketoconazole (NIZORAL) 2 % cream     . OVER THE COUNTER MEDICATION daily. Swedish bitters, 2 tsp    . predniSONE (DELTASONE) 10 MG tablet Take by mouth.    . tamsulosin (FLOMAX) 0.4 MG CAPS capsule Take 0.4 mg by mouth daily.  11  . triamterene-hydrochlorothiazide (MAXZIDE) 75-50 MG per tablet Take 1 tablet by mouth daily.     No current facility-administered medications on file prior to visit.    Allergies  Allergen  Reactions  . Chicken Allergy Anaphylaxis  . Other Anaphylaxis and Swelling    Chicken  . Penicillins Itching and Swelling  . Poultry Meal Swelling    Objective: Physical Exam  General: Christopher [name] is a pleasant 86 y.o. Caucasian male, morbidly obese, in NAD. AAO x 3.   Vascular:  Capillary refill time to digits immediate b/l. Palpable pedal pulses b/l LE. Pedal hair present. Lower extremity skin temperature gradient within normal limits. Dependent rubor noted b/l lower extremities. No pain with calf compression b/l. Nonpitting edema noted b/l lower extremities. Evidence of chronic venous insufficiency b/l lower extremities.  Dermatological:  Pedal skin with normal turgor, texture and tone bilaterally. No open wounds bilaterally. No interdigital macerations bilaterally. Toenails 1-5 b/l elongated, discolored, dystrophic, thickened, crumbly with subungual debris and tenderness to dorsal palpation. Evidence of chronic venous insufficiency b/l LE.  Musculoskeletal:  Normal muscle strength 5/5 to all lower extremity muscle groups bilaterally. No gross bony deformities bilaterally. No pain crepitus or joint limitation noted with ROM b/l.  Neurological:  Protective sensation intact 5/5 intact bilaterally with 10g monofilament b/l. Vibratory sensation intact b/l.  Assessment and Plan:  1. Pain due to onychomycosis of toenails of both feet   2. Localized edema   3. Chronic venous insufficiency of lower extremity    -Examined patient. -No new findings. No new orders. -Toenails 1-5 b/l were debrided in length and girth with sterile nail nippers and dremel without iatrogenic bleeding.  -For edema, continue daily use of compression hose and elevate feet when at  rest to minimize edema. -Patient to continue soft, supportive shoe gear daily. -Patient to report any pedal injuries to medical professional immediately. -Patient/POA to call should there be question/concern in the interim.  Return  in about 9 weeks (around 01/18/2020).  Marzetta Board, DPM

## 2019-12-11 ENCOUNTER — Other Ambulatory Visit (HOSPITAL_COMMUNITY): Payer: Self-pay

## 2019-12-11 DIAGNOSIS — Z20822 Contact with and (suspected) exposure to covid-19: Secondary | ICD-10-CM | POA: Diagnosis not present

## 2019-12-11 DIAGNOSIS — J988 Other specified respiratory disorders: Secondary | ICD-10-CM | POA: Diagnosis not present

## 2019-12-12 ENCOUNTER — Telehealth: Payer: Self-pay | Admitting: Unknown Physician Specialty

## 2019-12-12 ENCOUNTER — Other Ambulatory Visit: Payer: Self-pay | Admitting: Unknown Physician Specialty

## 2019-12-12 DIAGNOSIS — U071 COVID-19: Secondary | ICD-10-CM

## 2019-12-12 NOTE — Telephone Encounter (Signed)
I connected by phone with Christopher [name] on 12/12/2019 at 4:20 PM to discuss the potential use of a new treatment for mild to moderate COVID-19 viral infection in non-hospitalized patients.  This patient is a 86 y.o. male that meets the FDA criteria for Emergency Use Authorization of COVID monoclonal antibody casirivimab/imdevimab, bamlanivimab/eteseviamb, or sotrovimab.  Has a (+) direct SARS-CoV-2 viral test result  Has mild or moderate COVID-19   Is NOT hospitalized due to COVID-19  Is within 10 days of symptom onset  Has at least one of the high risk factor(s) for progression to severe COVID-19 and/or hospitalization as defined in EUA.  Specific high risk criteria : BMI > 25   I have spoken and communicated the following to the patient or parent/caregiver regarding COVID monoclonal antibody treatment:  1. FDA has authorized the emergency use for the treatment of mild to moderate COVID-19 in adults and pediatric patients with positive results of direct SARS-CoV-2 viral testing who are 77 years of age and older weighing at least 40 kg, and who are at high risk for progressing to severe COVID-19 and/or hospitalization.  2. The significant known and potential risks and benefits of COVID monoclonal antibody, and the extent to which such potential risks and benefits are unknown.  3. Information on available alternative treatments and the risks and benefits of those alternatives, including clinical trials.  4. Patients treated with COVID monoclonal antibody should continue to self-isolate and use infection control measures (e.g., wear mask, isolate, social distance, avoid sharing personal items, clean and disinfect "high touch" surfaces, and frequent handwashing) according to CDC guidelines.   5. The patient or parent/caregiver has the option to accept or refuse COVID monoclonal antibody treatment.  After reviewing this information with the patient, the patient has agreed to receive one of the  available covid 19 monoclonal antibodies and will be provided an appropriate fact sheet prior to infusion. Kathrine Haddock, NP 12/12/2019 4:20 PM  Sx onset 11/28

## 2019-12-13 ENCOUNTER — Ambulatory Visit (HOSPITAL_COMMUNITY)
Admission: RE | Admit: 2019-12-13 | Discharge: 2019-12-13 | Disposition: A | Discharge status: Home / Self Care | Payer: Medicare Other | Source: Ambulatory Visit | Attending: Pulmonary Disease | Admitting: Pulmonary Disease

## 2019-12-13 DIAGNOSIS — Z23 Encounter for immunization: Secondary | ICD-10-CM | POA: Insufficient documentation

## 2019-12-13 DIAGNOSIS — U071 COVID-19: Secondary | ICD-10-CM | POA: Diagnosis not present

## 2019-12-13 DIAGNOSIS — Z6841 Body Mass Index (BMI) 40.0 and over, adult: Secondary | ICD-10-CM | POA: Diagnosis not present

## 2019-12-13 MED ORDER — EPINEPHRINE 0.3 MG/0.3ML IJ SOAJ: 0.3000 mg | Freq: Once | INTRAMUSCULAR | Status: DC | PRN

## 2019-12-13 MED ORDER — METHYLPREDNISOLONE SODIUM SUCC 125 MG IJ SOLR: 125.0000 mg | Freq: Once | INTRAMUSCULAR | Status: DC | PRN

## 2019-12-13 MED ORDER — SOTROVIMAB 500 MG/8ML IV SOLN
500.0000 mg | Freq: Once | INTRAVENOUS | Status: AC
  Administered 2019-12-13: 500 mg via INTRAVENOUS

## 2019-12-13 MED ORDER — FAMOTIDINE IN NACL 20-0.9 MG/50ML-% IV SOLN: 20.0000 mg | Freq: Once | INTRAVENOUS | Status: DC | PRN

## 2019-12-13 MED ORDER — ALBUTEROL SULFATE HFA 108 (90 BASE) MCG/ACT IN AERS: 2.0000 | INHALATION_SPRAY | Freq: Once | RESPIRATORY_TRACT | Status: DC | PRN

## 2019-12-13 MED ORDER — SODIUM CHLORIDE 0.9 % IV SOLN: INTRAVENOUS | Status: DC | PRN

## 2019-12-13 MED ORDER — DIPHENHYDRAMINE HCL 50 MG/ML IJ SOLN: 50.0000 mg | Freq: Once | INTRAMUSCULAR | Status: DC | PRN

## 2019-12-13 NOTE — Progress Notes (Signed)
Patient reviewed Fact Sheet for Patients, Parents, and Caregivers for Emergency Use Authorization (EUA) of Sotrovimab for the Treatment of Coronavirus. Patient also reviewed and is agreeable to the estimated cost of treatment. Patient is agreeable to proceed.   

## 2019-12-13 NOTE — Discharge Instructions (Signed)
10 Things You Can Do to Manage Your COVID-19 Symptoms at Home If you have possible or confirmed COVID-19: 1. Stay home from work and school. And stay away from other public places. If you must go out, avoid using any kind of public transportation, ridesharing, or taxis. 2. Monitor your symptoms carefully. If your symptoms get worse, call your healthcare provider immediately. 3. Get rest and stay hydrated. 4. If you have a medical appointment, call the healthcare provider ahead of time and tell them that you have or may have COVID-19. 5. For medical emergencies, call 911 and notify the dispatch personnel that you have or may have COVID-19. 6. Cover your cough and sneezes with a tissue or use the inside of your elbow. 7. Wash your hands often with soap and water for at least 20 seconds or clean your hands with an alcohol-based hand sanitizer that contains at least 60% alcohol. 8. As much as possible, stay in a specific room and away from other people in your home. Also, you should use a separate bathroom, if available. If you need to be around other people in or outside of the home, wear a mask. 9. Avoid sharing personal items with other people in your household, like dishes, towels, and bedding. 10. Clean all surfaces that are touched often, like counters, tabletops, and doorknobs. Use household cleaning sprays or wipes according to the label instructions. cdc.gov/coronavirus 07/12/2018 This information is not intended to replace advice given to you by your health care provider. Make sure you discuss any questions you have with your health care provider. Document Revised: 12/14/2018 Document Reviewed: 12/14/2018 Elsevier Patient Education  2020 Elsevier Inc. What types of side effects do monoclonal antibody drugs cause?  Common side effects  In general, the more common side effects caused by monoclonal antibody drugs include: . Allergic reactions, such as hives or itching . Flu-like signs and  symptoms, including chills, fatigue, fever, and muscle aches and pains . Nausea, vomiting . Diarrhea . Skin rashes . Low blood pressure   The CDC is recommending patients who receive monoclonal antibody treatments wait at least 90 days before being vaccinated.  Currently, there are no data on the safety and efficacy of mRNA COVID-19 vaccines in persons who received monoclonal antibodies or convalescent plasma as part of COVID-19 treatment. Based on the estimated half-life of such therapies as well as evidence suggesting that reinfection is uncommon in the 90 days after initial infection, vaccination should be deferred for at least 90 days, as a precautionary measure until additional information becomes available, to avoid interference of the antibody treatment with vaccine-induced immune responses. If you have any questions or concerns after the infusion please call the Advanced Practice Provider on call at [phone]. This number is ONLY intended for your use regarding questions or concerns about the infusion post-treatment side-effects.  Please do not provide this number to others for use. For return to work notes please contact your primary care provider.   If someone you know is interested in receiving treatment please have them call the COVID hotline at [phone].   

## 2019-12-13 NOTE — Progress Notes (Signed)
Diagnosis: COVID-19  Physician: Dr. Patrick Wright  Procedure: [facility]fusion [facility] Med: Sotrovimab infusion - Provided patient with sotrovimab fact sheet for patients, parents, and caregivers prior to infusion.   Complications: No immediate complications noted  Discharge: Discharged home    

## 2019-12-25 DIAGNOSIS — Z23 Encounter for immunization: Secondary | ICD-10-CM | POA: Diagnosis not present

## 2019-12-25 DIAGNOSIS — I129 Hypertensive chronic kidney disease with stage 1 through stage 4 chronic kidney disease, or unspecified chronic kidney disease: Secondary | ICD-10-CM | POA: Diagnosis not present

## 2019-12-25 DIAGNOSIS — N1832 Chronic kidney disease, stage 3b: Secondary | ICD-10-CM | POA: Diagnosis not present

## 2019-12-25 DIAGNOSIS — Z79899 Other long term (current) drug therapy: Secondary | ICD-10-CM | POA: Diagnosis not present

## 2020-02-07 DIAGNOSIS — L821 Other seborrheic keratosis: Secondary | ICD-10-CM | POA: Diagnosis not present

## 2020-02-07 DIAGNOSIS — L570 Actinic keratosis: Secondary | ICD-10-CM | POA: Diagnosis not present

## 2020-02-07 DIAGNOSIS — L814 Other melanin hyperpigmentation: Secondary | ICD-10-CM | POA: Diagnosis not present

## 2020-02-07 DIAGNOSIS — D1801 Hemangioma of skin and subcutaneous tissue: Secondary | ICD-10-CM | POA: Diagnosis not present

## 2020-02-07 DIAGNOSIS — D692 Other nonthrombocytopenic purpura: Secondary | ICD-10-CM | POA: Diagnosis not present

## 2020-02-07 DIAGNOSIS — Z85828 Personal history of other malignant neoplasm of skin: Secondary | ICD-10-CM | POA: Diagnosis not present

## 2020-02-22 ENCOUNTER — Ambulatory Visit (INDEPENDENT_AMBULATORY_CARE_PROVIDER_SITE_OTHER): Payer: Medicare Other | Admitting: Podiatry

## 2020-02-22 ENCOUNTER — Encounter: Payer: Self-pay | Admitting: Podiatry

## 2020-02-22 ENCOUNTER — Other Ambulatory Visit: Payer: Self-pay

## 2020-02-22 DIAGNOSIS — B351 Tinea unguium: Secondary | ICD-10-CM

## 2020-02-22 DIAGNOSIS — M79675 Pain in left toe(s): Secondary | ICD-10-CM

## 2020-02-22 DIAGNOSIS — I700 Atherosclerosis of aorta: Secondary | ICD-10-CM | POA: Insufficient documentation

## 2020-02-22 DIAGNOSIS — N183 Chronic kidney disease, stage 3 unspecified: Secondary | ICD-10-CM | POA: Insufficient documentation

## 2020-02-22 DIAGNOSIS — Z86010 Personal history of colonic polyps: Secondary | ICD-10-CM | POA: Insufficient documentation

## 2020-02-22 DIAGNOSIS — I499 Cardiac arrhythmia, unspecified: Secondary | ICD-10-CM | POA: Insufficient documentation

## 2020-02-22 DIAGNOSIS — I872 Venous insufficiency (chronic) (peripheral): Secondary | ICD-10-CM

## 2020-02-22 DIAGNOSIS — K5730 Diverticulosis of large intestine without perforation or abscess without bleeding: Secondary | ICD-10-CM | POA: Insufficient documentation

## 2020-02-22 DIAGNOSIS — R600 Localized edema: Secondary | ICD-10-CM

## 2020-02-22 DIAGNOSIS — M79674 Pain in right toe(s): Secondary | ICD-10-CM | POA: Diagnosis not present

## 2020-02-22 DIAGNOSIS — I129 Hypertensive chronic kidney disease with stage 1 through stage 4 chronic kidney disease, or unspecified chronic kidney disease: Secondary | ICD-10-CM | POA: Insufficient documentation

## 2020-02-22 DIAGNOSIS — J309 Allergic rhinitis, unspecified: Secondary | ICD-10-CM | POA: Insufficient documentation

## 2020-02-22 DIAGNOSIS — M1990 Unspecified osteoarthritis, unspecified site: Secondary | ICD-10-CM | POA: Insufficient documentation

## 2020-02-22 DIAGNOSIS — J301 Allergic rhinitis due to pollen: Secondary | ICD-10-CM | POA: Insufficient documentation

## 2020-02-22 DIAGNOSIS — I493 Ventricular premature depolarization: Secondary | ICD-10-CM | POA: Insufficient documentation

## 2020-02-22 DIAGNOSIS — M109 Gout, unspecified: Secondary | ICD-10-CM | POA: Insufficient documentation

## 2020-02-29 NOTE — Progress Notes (Signed)
Subjective: Christopher [name] is a 87 y.o. male patient seen today painful mycotic nails b/l that are difficult to trim. Pain interferes with ambulation. Aggravating factors include wearing enclosed shoe gear. Pain is relieved with periodic professional debridement.  [name] voices no new pedal concerns on today's visit.  He has h/o chronic LE edema and wears compression hose.  Allergies  Allergen Reactions  . Chicken Allergy Anaphylaxis  . Other Anaphylaxis and Swelling    Chicken  . Penicillins Itching and Swelling  . Poultry Meal Swelling   Objective: Physical Exam  General: Christopher [name] is a pleasant 87 y.o. Caucasian male, morbidly obese, in NAD. AAO x 3.   Vascular:  Capillary refill time to digits immediate b/l. Palpable pedal pulses b/l LE. Pedal hair present. Lower extremity skin temperature gradient within normal limits. Dependent rubor noted b/l lower extremities. No pain with calf compression b/l. Nonpitting edema noted b/l lower extremities. Evidence of chronic venous insufficiency b/l lower extremities.  Dermatological:  Pedal skin with normal turgor, texture and tone bilaterally. No open wounds bilaterally. No interdigital macerations bilaterally. Toenails 1-5 b/l elongated, discolored, dystrophic, thickened, crumbly with subungual debris and tenderness to dorsal palpation. Evidence of chronic venous insufficiency b/l LE.  Musculoskeletal:  Normal muscle strength 5/5 to all lower extremity muscle groups bilaterally. No gross bony deformities bilaterally. No pain crepitus or joint limitation noted with ROM b/l.  Neurological:  Protective sensation intact 5/5 intact bilaterally with 10g monofilament b/l. Vibratory sensation intact b/l.  Assessment and Plan:  1. Pain due to onychomycosis of toenails of both feet   2. Localized edema   3. Chronic venous insufficiency of lower extremity    -Examined patient. -No new findings. No new orders. -Toenails 1-5 b/l were debrided  in length and girth with sterile nail nippers and dremel without iatrogenic bleeding.  -For edema, continue daily use of compression hose and elevate feet when at rest to minimize edema. -Patient to continue soft, supportive shoe gear daily. -Patient to report any pedal injuries to medical professional immediately. -Patient/POA to call should there be question/concern in the interim.  Return in about 3 months (around 05/21/2020) for nail trim.  Marzetta Board, DPM

## 2020-03-11 ENCOUNTER — Other Ambulatory Visit: Payer: Self-pay | Admitting: Internal Medicine

## 2020-03-11 ENCOUNTER — Ambulatory Visit
Admission: RE | Admit: 2020-03-11 | Discharge: 2020-03-11 | Disposition: A | Discharge status: Home / Self Care | Payer: Medicare Other | Source: Ambulatory Visit | Attending: Internal Medicine | Admitting: Internal Medicine

## 2020-03-11 DIAGNOSIS — I708 Atherosclerosis of other arteries: Secondary | ICD-10-CM | POA: Diagnosis not present

## 2020-03-11 DIAGNOSIS — M25552 Pain in left hip: Secondary | ICD-10-CM | POA: Diagnosis not present

## 2020-03-11 DIAGNOSIS — M545 Low back pain, unspecified: Secondary | ICD-10-CM | POA: Diagnosis not present

## 2020-03-11 DIAGNOSIS — I70202 Unspecified atherosclerosis of native arteries of extremities, left leg: Secondary | ICD-10-CM | POA: Diagnosis not present

## 2020-03-11 DIAGNOSIS — L03116 Cellulitis of left lower limb: Secondary | ICD-10-CM | POA: Diagnosis not present

## 2020-03-14 DIAGNOSIS — L03116 Cellulitis of left lower limb: Secondary | ICD-10-CM | POA: Diagnosis not present

## 2020-04-05 DIAGNOSIS — N390 Urinary tract infection, site not specified: Secondary | ICD-10-CM | POA: Diagnosis not present

## 2020-04-05 DIAGNOSIS — R300 Dysuria: Secondary | ICD-10-CM | POA: Diagnosis not present

## 2020-04-09 DIAGNOSIS — H353131 Nonexudative age-related macular degeneration, bilateral, early dry stage: Secondary | ICD-10-CM | POA: Diagnosis not present

## 2020-04-24 DIAGNOSIS — M7989 Other specified soft tissue disorders: Secondary | ICD-10-CM | POA: Diagnosis not present

## 2020-04-28 DIAGNOSIS — I872 Venous insufficiency (chronic) (peripheral): Secondary | ICD-10-CM | POA: Diagnosis not present

## 2020-04-28 DIAGNOSIS — M7989 Other specified soft tissue disorders: Secondary | ICD-10-CM | POA: Diagnosis not present

## 2020-05-13 DIAGNOSIS — I129 Hypertensive chronic kidney disease with stage 1 through stage 4 chronic kidney disease, or unspecified chronic kidney disease: Secondary | ICD-10-CM | POA: Diagnosis not present

## 2020-05-13 DIAGNOSIS — I872 Venous insufficiency (chronic) (peripheral): Secondary | ICD-10-CM | POA: Diagnosis not present

## 2020-05-13 DIAGNOSIS — R600 Localized edema: Secondary | ICD-10-CM | POA: Diagnosis not present

## 2020-05-13 DIAGNOSIS — Z79899 Other long term (current) drug therapy: Secondary | ICD-10-CM | POA: Diagnosis not present

## 2020-05-13 DIAGNOSIS — N1832 Chronic kidney disease, stage 3b: Secondary | ICD-10-CM | POA: Diagnosis not present

## 2020-05-21 DIAGNOSIS — I129 Hypertensive chronic kidney disease with stage 1 through stage 4 chronic kidney disease, or unspecified chronic kidney disease: Secondary | ICD-10-CM | POA: Diagnosis not present

## 2020-05-21 DIAGNOSIS — R600 Localized edema: Secondary | ICD-10-CM | POA: Diagnosis not present

## 2020-05-21 DIAGNOSIS — M79604 Pain in right leg: Secondary | ICD-10-CM | POA: Diagnosis not present

## 2020-05-21 DIAGNOSIS — M7122 Synovial cyst of popliteal space [Baker], left knee: Secondary | ICD-10-CM | POA: Diagnosis not present

## 2020-05-21 DIAGNOSIS — N1832 Chronic kidney disease, stage 3b: Secondary | ICD-10-CM | POA: Diagnosis not present

## 2020-05-21 DIAGNOSIS — I872 Venous insufficiency (chronic) (peripheral): Secondary | ICD-10-CM | POA: Diagnosis not present

## 2020-06-03 ENCOUNTER — Ambulatory Visit: Payer: Medicare Other | Admitting: Podiatry

## 2020-06-05 DIAGNOSIS — N1832 Chronic kidney disease, stage 3b: Secondary | ICD-10-CM | POA: Diagnosis not present

## 2020-06-05 DIAGNOSIS — I872 Venous insufficiency (chronic) (peripheral): Secondary | ICD-10-CM | POA: Diagnosis not present

## 2020-06-05 DIAGNOSIS — L03116 Cellulitis of left lower limb: Secondary | ICD-10-CM | POA: Diagnosis not present

## 2020-06-05 DIAGNOSIS — I499 Cardiac arrhythmia, unspecified: Secondary | ICD-10-CM | POA: Diagnosis not present

## 2020-06-12 DIAGNOSIS — N1832 Chronic kidney disease, stage 3b: Secondary | ICD-10-CM | POA: Diagnosis not present

## 2020-06-12 DIAGNOSIS — L03116 Cellulitis of left lower limb: Secondary | ICD-10-CM | POA: Diagnosis not present

## 2020-06-12 DIAGNOSIS — I872 Venous insufficiency (chronic) (peripheral): Secondary | ICD-10-CM | POA: Diagnosis not present

## 2020-06-17 DIAGNOSIS — N1832 Chronic kidney disease, stage 3b: Secondary | ICD-10-CM | POA: Diagnosis not present

## 2020-06-20 DIAGNOSIS — N1832 Chronic kidney disease, stage 3b: Secondary | ICD-10-CM | POA: Diagnosis not present

## 2020-06-20 DIAGNOSIS — L03116 Cellulitis of left lower limb: Secondary | ICD-10-CM | POA: Diagnosis not present

## 2020-06-25 DIAGNOSIS — N1832 Chronic kidney disease, stage 3b: Secondary | ICD-10-CM | POA: Diagnosis not present

## 2020-06-25 DIAGNOSIS — L03116 Cellulitis of left lower limb: Secondary | ICD-10-CM | POA: Diagnosis not present

## 2020-06-25 DIAGNOSIS — I872 Venous insufficiency (chronic) (peripheral): Secondary | ICD-10-CM | POA: Diagnosis not present

## 2020-07-08 ENCOUNTER — Encounter: Payer: Self-pay | Admitting: Podiatry

## 2020-07-08 ENCOUNTER — Ambulatory Visit (INDEPENDENT_AMBULATORY_CARE_PROVIDER_SITE_OTHER): Payer: Medicare Other | Admitting: Podiatry

## 2020-07-08 ENCOUNTER — Other Ambulatory Visit: Payer: Self-pay

## 2020-07-08 DIAGNOSIS — I872 Venous insufficiency (chronic) (peripheral): Secondary | ICD-10-CM | POA: Insufficient documentation

## 2020-07-08 DIAGNOSIS — M79675 Pain in left toe(s): Secondary | ICD-10-CM

## 2020-07-08 DIAGNOSIS — M79674 Pain in right toe(s): Secondary | ICD-10-CM | POA: Diagnosis not present

## 2020-07-08 DIAGNOSIS — B351 Tinea unguium: Secondary | ICD-10-CM

## 2020-07-09 DIAGNOSIS — I872 Venous insufficiency (chronic) (peripheral): Secondary | ICD-10-CM | POA: Diagnosis not present

## 2020-07-09 DIAGNOSIS — N1832 Chronic kidney disease, stage 3b: Secondary | ICD-10-CM | POA: Diagnosis not present

## 2020-07-10 NOTE — Progress Notes (Addendum)
Subjective: Christopher [name] is a pleasant 87 y.o. male patient seen today painful thick toenails that are difficult to trim. Pain interferes with ambulation. Aggravating factors include wearing enclosed shoe gear. Pain is relieved with periodic professional debridement.  PCP is [name], MD. Last visit was: 12/25/2019.  Patient states he has been dealing with LE edema and has had cellulitis of the left LE. He states it was managed by Dr. Felipa Eth. He also sees Dr. Ronnald Ramp for this as well. He states his leg began to swell again after he was taken off of his fluid pill.  Allergies  Allergen Reactions   Chicken Allergy Anaphylaxis   Other Anaphylaxis and Swelling    Chicken   Penicillins Itching and Swelling   Poultry Meal Swelling    Objective: Physical Exam  General: Christopher [name] is a pleasant 87 y.o. Caucasian male, morbidly obese in NAD. AAO x 3.   Vascular:  Capillary refill time to digits immediate b/l. Palpable pedal pulses b/l LE. Pedal hair present. Lower extremity skin temperature gradient within normal limits. Dependent rubor noted b/l lower extremities. No increased warmth. No blistering.  No pain with calf compression b/l. Trace edema noted right lower extremity. +1 pitting edema left lower extremity. Evidence of chronic venous insufficiency b/l lower extremities.  Dermatological:  No open wounds b/l lower extremities No interdigital macerations b/l lower extremities Toenails 1-5 b/l elongated, discolored, dystrophic, thickened, crumbly with subungual debris and tenderness to dorsal palpation.  Musculoskeletal:  Normal muscle strength 5/5 to all lower extremity muscle groups bilaterally. No pain crepitus or joint limitation noted with ROM b/l. No gross bony deformities bilaterally.  Neurological:  Protective sensation intact 5/5 intact bilaterally with 10g monofilament b/l. Vibratory sensation intact b/l.  Assessment and Plan:  1. Pain due to onychomycosis of toenails  of both feet    -Examined patient. -He will be following up with Dr. Ronnald Ramp and Dr. Felipa Eth for LE edema/cellulitis. -Patient to continue soft, supportive shoe gear daily. -Toenails 1-5 b/l were debrided in length and girth with sterile nail nippers and dremel without iatrogenic bleeding.  -Patient to report any pedal injuries to medical professional immediately. -Patient/POA to call should there be question/concern in the interim.  Return in about 10 weeks (around 09/16/2020).  Marzetta Board, DPM

## 2020-07-12 DIAGNOSIS — S50812A Abrasion of left forearm, initial encounter: Secondary | ICD-10-CM | POA: Diagnosis not present

## 2020-07-12 DIAGNOSIS — S60811A Abrasion of right wrist, initial encounter: Secondary | ICD-10-CM | POA: Diagnosis not present

## 2020-07-12 DIAGNOSIS — S51812A Laceration without foreign body of left forearm, initial encounter: Secondary | ICD-10-CM | POA: Diagnosis not present

## 2020-07-15 DIAGNOSIS — S51812D Laceration without foreign body of left forearm, subsequent encounter: Secondary | ICD-10-CM | POA: Diagnosis not present

## 2020-07-15 DIAGNOSIS — S50819A Abrasion of unspecified forearm, initial encounter: Secondary | ICD-10-CM | POA: Diagnosis not present

## 2020-07-22 DIAGNOSIS — Z5189 Encounter for other specified aftercare: Secondary | ICD-10-CM | POA: Diagnosis not present

## 2020-07-22 DIAGNOSIS — T798XXA Other early complications of trauma, initial encounter: Secondary | ICD-10-CM | POA: Diagnosis not present

## 2020-08-07 DIAGNOSIS — I890 Lymphedema, not elsewhere classified: Secondary | ICD-10-CM | POA: Diagnosis not present

## 2020-08-07 DIAGNOSIS — L821 Other seborrheic keratosis: Secondary | ICD-10-CM | POA: Diagnosis not present

## 2020-08-07 DIAGNOSIS — Z85828 Personal history of other malignant neoplasm of skin: Secondary | ICD-10-CM | POA: Diagnosis not present

## 2020-08-07 DIAGNOSIS — L570 Actinic keratosis: Secondary | ICD-10-CM | POA: Diagnosis not present

## 2020-08-27 DIAGNOSIS — Z0000 Encounter for general adult medical examination without abnormal findings: Secondary | ICD-10-CM | POA: Diagnosis not present

## 2020-08-27 DIAGNOSIS — Z1389 Encounter for screening for other disorder: Secondary | ICD-10-CM | POA: Diagnosis not present

## 2020-08-27 DIAGNOSIS — I872 Venous insufficiency (chronic) (peripheral): Secondary | ICD-10-CM | POA: Diagnosis not present

## 2020-08-27 DIAGNOSIS — D692 Other nonthrombocytopenic purpura: Secondary | ICD-10-CM | POA: Diagnosis not present

## 2020-08-27 DIAGNOSIS — N1832 Chronic kidney disease, stage 3b: Secondary | ICD-10-CM | POA: Diagnosis not present

## 2020-10-02 DIAGNOSIS — N1832 Chronic kidney disease, stage 3b: Secondary | ICD-10-CM | POA: Diagnosis not present

## 2020-10-02 DIAGNOSIS — I872 Venous insufficiency (chronic) (peripheral): Secondary | ICD-10-CM | POA: Diagnosis not present

## 2020-10-02 DIAGNOSIS — R300 Dysuria: Secondary | ICD-10-CM | POA: Diagnosis not present

## 2020-10-02 DIAGNOSIS — Z23 Encounter for immunization: Secondary | ICD-10-CM | POA: Diagnosis not present

## 2020-10-13 ENCOUNTER — Ambulatory Visit: Payer: Medicare Other | Admitting: Podiatry

## 2020-10-14 DIAGNOSIS — C61 Malignant neoplasm of prostate: Secondary | ICD-10-CM | POA: Diagnosis not present

## 2020-10-28 ENCOUNTER — Other Ambulatory Visit: Payer: Self-pay

## 2020-10-28 ENCOUNTER — Encounter: Payer: Self-pay | Admitting: Podiatry

## 2020-10-28 ENCOUNTER — Ambulatory Visit (INDEPENDENT_AMBULATORY_CARE_PROVIDER_SITE_OTHER): Payer: Medicare Other | Admitting: Podiatry

## 2020-10-28 DIAGNOSIS — M79674 Pain in right toe(s): Secondary | ICD-10-CM

## 2020-10-28 DIAGNOSIS — M79675 Pain in left toe(s): Secondary | ICD-10-CM | POA: Diagnosis not present

## 2020-10-28 DIAGNOSIS — B351 Tinea unguium: Secondary | ICD-10-CM | POA: Diagnosis not present

## 2020-11-02 NOTE — Progress Notes (Signed)
Subjective: Christopher [name] is a 87 y.o. male patient seen today for follow up of  painful thick toenails that are difficult to trim. Pain interferes with ambulation. Aggravating factors include wearing enclosed shoe gear. Pain is relieved with periodic professional debridement.  New problems reported today: Patient states he was taken off of his diuretic and left leg has started swelling again. He will see his urologist on Tuesday  PCP is [name], MD. Last visit was: 07/09/2020.  Allergies  Allergen Reactions   Chicken Allergy Anaphylaxis   Other Anaphylaxis and Swelling    Chicken   Penicillins Itching and Swelling   Poultry Meal Swelling   Objective: Physical Exam  General: Patient is a pleasant 87 y.o. Caucasian male morbidly obese in NAD. AAO x 3.   Neurovascular Examination: Capillary refill time to digits immediate b/l lower extremities. Palpable DP pulse(s) b/l lower extremities Palpable PT pulse(s) b/l lower extremities Pedal hair absent. Lower extremity skin temperature gradient within normal limits. Dependent rubor noted left lower extremity. No pain with calf compression b/l. Unilateral edema left LE. Evidence of chronic venous insufficiency b/l lower extremities.  Protective sensation intact 5/5 intact bilaterally with 10g monofilament b/l.  Dermatological:  Skin warm and supple b/l lower extremities. No open wounds b/l LE. No interdigital macerations b/l lower extremities. Toenails 1-5 b/l elongated, discolored, dystrophic, thickened, crumbly with subungual debris and tenderness to dorsal palpation.  Musculoskeletal:  Normal muscle strength 5/5 to all lower extremity muscle groups bilaterally. No gross bony deformities b/l lower extremities.  Assessment: 1. Pain due to onychomycosis of toenails of both feet    Plan: Patient was evaluated and treated and all questions answered. Consent given for treatment as described below: -No new findings. No new  orders. -Mycotic toenails were debrided in length and girth with sterile nail nippers and dremel without iatrogenic bleeding. -Patient to report any pedal injuries to medical professional immediately. -Patient/POA to call should there be question/concern in the interim.  Return in about 3 months (around 01/28/2021).  Marzetta Board, DPM

## 2020-11-17 DIAGNOSIS — R350 Frequency of micturition: Secondary | ICD-10-CM | POA: Diagnosis not present

## 2020-11-17 DIAGNOSIS — R351 Nocturia: Secondary | ICD-10-CM | POA: Diagnosis not present

## 2020-11-17 DIAGNOSIS — N3041 Irradiation cystitis with hematuria: Secondary | ICD-10-CM | POA: Diagnosis not present

## 2020-11-17 DIAGNOSIS — N5231 Erectile dysfunction following radical prostatectomy: Secondary | ICD-10-CM | POA: Diagnosis not present

## 2020-11-17 DIAGNOSIS — R82998 Other abnormal findings in urine: Secondary | ICD-10-CM | POA: Diagnosis not present

## 2020-11-17 DIAGNOSIS — R3915 Urgency of urination: Secondary | ICD-10-CM | POA: Diagnosis not present

## 2020-11-17 DIAGNOSIS — R8290 Unspecified abnormal findings in urine: Secondary | ICD-10-CM | POA: Diagnosis not present

## 2020-11-17 DIAGNOSIS — Z8546 Personal history of malignant neoplasm of prostate: Secondary | ICD-10-CM | POA: Diagnosis not present

## 2020-11-25 DIAGNOSIS — N1832 Chronic kidney disease, stage 3b: Secondary | ICD-10-CM | POA: Diagnosis not present

## 2020-11-25 DIAGNOSIS — I872 Venous insufficiency (chronic) (peripheral): Secondary | ICD-10-CM | POA: Diagnosis not present

## 2020-11-25 DIAGNOSIS — R109 Unspecified abdominal pain: Secondary | ICD-10-CM | POA: Diagnosis not present

## 2020-12-03 DIAGNOSIS — H353131 Nonexudative age-related macular degeneration, bilateral, early dry stage: Secondary | ICD-10-CM | POA: Diagnosis not present

## 2021-02-09 ENCOUNTER — Encounter: Payer: Self-pay | Admitting: Podiatry

## 2021-02-09 ENCOUNTER — Ambulatory Visit (INDEPENDENT_AMBULATORY_CARE_PROVIDER_SITE_OTHER): Payer: Medicare Other | Admitting: Podiatry

## 2021-02-09 ENCOUNTER — Other Ambulatory Visit: Payer: Self-pay

## 2021-02-09 DIAGNOSIS — I890 Lymphedema, not elsewhere classified: Secondary | ICD-10-CM | POA: Diagnosis not present

## 2021-02-09 DIAGNOSIS — B351 Tinea unguium: Secondary | ICD-10-CM | POA: Diagnosis not present

## 2021-02-09 DIAGNOSIS — M79674 Pain in right toe(s): Secondary | ICD-10-CM | POA: Diagnosis not present

## 2021-02-09 DIAGNOSIS — D692 Other nonthrombocytopenic purpura: Secondary | ICD-10-CM | POA: Insufficient documentation

## 2021-02-09 DIAGNOSIS — M79675 Pain in left toe(s): Secondary | ICD-10-CM | POA: Diagnosis not present

## 2021-02-11 DIAGNOSIS — Z85828 Personal history of other malignant neoplasm of skin: Secondary | ICD-10-CM | POA: Diagnosis not present

## 2021-02-11 DIAGNOSIS — D692 Other nonthrombocytopenic purpura: Secondary | ICD-10-CM | POA: Diagnosis not present

## 2021-02-11 DIAGNOSIS — L570 Actinic keratosis: Secondary | ICD-10-CM | POA: Diagnosis not present

## 2021-02-11 DIAGNOSIS — L821 Other seborrheic keratosis: Secondary | ICD-10-CM | POA: Diagnosis not present

## 2021-02-15 ENCOUNTER — Encounter: Payer: Self-pay | Admitting: Podiatry

## 2021-02-15 NOTE — Progress Notes (Signed)
Subjective:  Patient ID: Christopher [name], male    DOB: 1928-03-30,  MRN: [id number]  Christopher [name] presents to clinic today for painful elongated mycotic toenails 1-5 bilaterally which are tender when wearing enclosed shoe gear. Pain is relieved with periodic professional debridement.  Patient states his left leg remains swollen. He takes diuretics from time to time to manage edema. He states he had surgery on his veins about 10 years ago and swelling has been a problem off and on since then.       PCP is Christopher Manes, MD , and last visit was 11/25/2020.  Past Surgical History:  Procedure Laterality Date   APPENDECTOMY  1941   CATARACT EXTRACTION W/PHACO Right 10/05/2017   Procedure: CATARACT EXTRACTION PHACO AND INTRAOCULAR LENS PLACEMENT ([place]) RIGHT;  Surgeon: Christopher Koyanagi, MD;  Location: [place];  Service: Ophthalmology;  Laterality: Right;  requests arrival time to be after 10am   CATARACT EXTRACTION W/PHACO Left 10/26/2017   Procedure: CATARACT EXTRACTION PHACO AND INTRAOCULAR LENS PLACEMENT ([place]) LEFT;  Surgeon: Christopher Koyanagi, MD;  Location: [facility];  Service: Ophthalmology;  Laterality: Left;   COLONOSCOPY     ENDOVENOUS ABLATION SAPHENOUS VEIN W/ LASER  12-01-2011   left greater saphenous vein by Christopher Jews MD    ENDOVENOUS ABLATION SAPHENOUS VEIN W/ LASER  12-22-2011   right greater saphenous vein    by Christopher Jews MD   HAND SURGERY     HEMORRHOID SURGERY  1970   PARTIAL COLECTOMY     prostate seeds      removal of chest wall tumor     sigmoid colon resection     TOTAL HIP ARTHROPLASTY Right 11/26/2014   Procedure: TOTAL HIP ARTHROPLASTY;  Surgeon: Christopher Balding, MD;  Location: [facility];  Service: Orthopedics;  Laterality: Right;   TOTAL KNEE ARTHROPLASTY  08-2010     Allergies  Allergen Reactions   Chicken Allergy Anaphylaxis    Other reaction(s): Unknown   Other Anaphylaxis and Swelling    Chicken   Mirabegron     Other  reaction(s): side pain   Penicillins Itching and Swelling   Poultry Meal Swelling    Review of Systems: Negative except as noted in the HPI. Objective:   Constitutional Christopher [name] is a pleasant 88 y.o. Caucasian male, morbidly obese in NAD. AAO x 3.   Vascular Capillary refill time to digits immediate b/l. Palpable pedal pulses b/l LE. Pedal hair absent. No pain with calf compression b/l. Unilateral edema left LE. No warmth, no blistering, no open wounds. +Varicosities. No cyanosis or clubbing noted b/l LE.  Neurologic Normal speech. Oriented to person, place, and time. Protective sensation intact 5/5 intact bilaterally with 10g monofilament b/l.  Dermatologic No open wounds b/l LE. No interdigital macerations noted b/l LE. Toenails 1-5 b/l well maintained with adequate length. No erythema, no edema, no drainage, no fluctuance. No hyperkeratotic nor porokeratotic lesions present on today's visit.  Orthopedic: Muscle strength 5/5 to all lower extremity muscle groups bilaterally. No pain, crepitus or joint limitation noted with ROM bilateral LE. Patient ambulates independent of any assistive aids.   Radiographs: None  Last A1c: No flowsheet data found.   Assessment:   1. Pain due to onychomycosis of toenails of both feet   2. Lymphedema of left lower extremity    Plan:  Patient was evaluated and treated and all questions answered. Consent given for treatment as described below: -Examined patient. -Discussed chronic edema with patient. Referral made to  [facility] for management of chronic LLE lymphedema. -Toenails 1-5 b/l were debrided in length and girth with sterile nail nippers and dremel without iatrogenic bleeding.  -Patient/POA to call should there be question/concern in the interim.  Return in about 3 months (around 05/10/2021).  Marzetta Board, DPM

## 2021-02-16 ENCOUNTER — Telehealth: Payer: Self-pay | Admitting: *Deleted

## 2021-02-16 NOTE — Telephone Encounter (Signed)
Patient is calling because [facility] called and said that they received his referral but they only treat open wounds at their facility. Please advise.

## 2021-02-18 ENCOUNTER — Other Ambulatory Visit: Payer: Self-pay | Admitting: *Deleted

## 2021-02-18 DIAGNOSIS — I890 Lymphedema, not elsewhere classified: Secondary | ICD-10-CM

## 2021-02-18 NOTE — Telephone Encounter (Signed)
-----   Message from Marzetta Board, DPM sent at 02/17/2021  1:57 AM EST ----- Regarding: Home Health Service Good morning Miss Jhania Etherington!  Can you find out if Mr. Liller has a [facility] that comes to his home? If he does, I can order Nursing to come and wrap his leg. If not, I can initiate home health services for him. Please let me know.  Appreciate all you do!  Dr. Elisha Ponder

## 2021-02-18 NOTE — Telephone Encounter (Signed)
Contacted patient and he does not have anyone coming to home for nursing.explained that we are sending a referral for home health from [facility], and someone will contact him to schedule an appointment, he verbalized understanding and was very thankful. Faxed referral to [facility], received confirmation 02/18/21.

## 2021-02-26 ENCOUNTER — Telehealth: Payer: Self-pay | Admitting: *Deleted

## 2021-02-26 DIAGNOSIS — Z9842 Cataract extraction status, left eye: Secondary | ICD-10-CM | POA: Diagnosis not present

## 2021-02-26 DIAGNOSIS — H9113 Presbycusis, bilateral: Secondary | ICD-10-CM | POA: Diagnosis not present

## 2021-02-26 DIAGNOSIS — I129 Hypertensive chronic kidney disease with stage 1 through stage 4 chronic kidney disease, or unspecified chronic kidney disease: Secondary | ICD-10-CM | POA: Diagnosis not present

## 2021-02-26 DIAGNOSIS — Z9181 History of falling: Secondary | ICD-10-CM | POA: Diagnosis not present

## 2021-02-26 DIAGNOSIS — J301 Allergic rhinitis due to pollen: Secondary | ICD-10-CM | POA: Diagnosis not present

## 2021-02-26 DIAGNOSIS — I872 Venous insufficiency (chronic) (peripheral): Secondary | ICD-10-CM | POA: Diagnosis not present

## 2021-02-26 DIAGNOSIS — D692 Other nonthrombocytopenic purpura: Secondary | ICD-10-CM | POA: Diagnosis not present

## 2021-02-26 DIAGNOSIS — J320 Chronic maxillary sinusitis: Secondary | ICD-10-CM | POA: Diagnosis not present

## 2021-02-26 DIAGNOSIS — Z87440 Personal history of urinary (tract) infections: Secondary | ICD-10-CM | POA: Diagnosis not present

## 2021-02-26 DIAGNOSIS — M109 Gout, unspecified: Secondary | ICD-10-CM | POA: Diagnosis not present

## 2021-02-26 DIAGNOSIS — N189 Chronic kidney disease, unspecified: Secondary | ICD-10-CM | POA: Diagnosis not present

## 2021-02-26 DIAGNOSIS — I890 Lymphedema, not elsewhere classified: Secondary | ICD-10-CM | POA: Diagnosis not present

## 2021-02-26 DIAGNOSIS — M65341 Trigger finger, right ring finger: Secondary | ICD-10-CM | POA: Diagnosis not present

## 2021-02-26 DIAGNOSIS — Z6841 Body Mass Index (BMI) 40.0 and over, adult: Secondary | ICD-10-CM | POA: Diagnosis not present

## 2021-02-26 DIAGNOSIS — K5790 Diverticulosis of intestine, part unspecified, without perforation or abscess without bleeding: Secondary | ICD-10-CM | POA: Diagnosis not present

## 2021-02-26 DIAGNOSIS — H833X3 Noise effects on inner ear, bilateral: Secondary | ICD-10-CM | POA: Diagnosis not present

## 2021-02-26 DIAGNOSIS — G5602 Carpal tunnel syndrome, left upper limb: Secondary | ICD-10-CM | POA: Diagnosis not present

## 2021-02-26 DIAGNOSIS — I739 Peripheral vascular disease, unspecified: Secondary | ICD-10-CM | POA: Diagnosis not present

## 2021-02-26 DIAGNOSIS — Z9841 Cataract extraction status, right eye: Secondary | ICD-10-CM | POA: Diagnosis not present

## 2021-02-26 DIAGNOSIS — G5622 Lesion of ulnar nerve, left upper limb: Secondary | ICD-10-CM | POA: Diagnosis not present

## 2021-02-26 DIAGNOSIS — B351 Tinea unguium: Secondary | ICD-10-CM | POA: Diagnosis not present

## 2021-02-26 DIAGNOSIS — Z96641 Presence of right artificial hip joint: Secondary | ICD-10-CM | POA: Diagnosis not present

## 2021-02-26 DIAGNOSIS — I120 Hypertensive chronic kidney disease with stage 5 chronic kidney disease or end stage renal disease: Secondary | ICD-10-CM | POA: Diagnosis not present

## 2021-02-26 DIAGNOSIS — Z9089 Acquired absence of other organs: Secondary | ICD-10-CM | POA: Diagnosis not present

## 2021-02-26 NOTE — Telephone Encounter (Addendum)
Tabitha w/ [facility] is calling for nursing frequency orders, 1 week 4, 1 q other week for 1 prn, compression wraps. Please [phone]

## 2021-02-27 ENCOUNTER — Other Ambulatory Visit (INDEPENDENT_AMBULATORY_CARE_PROVIDER_SITE_OTHER): Payer: Medicare Other | Admitting: Podiatry

## 2021-02-27 DIAGNOSIS — I872 Venous insufficiency (chronic) (peripheral): Secondary | ICD-10-CM

## 2021-02-27 DIAGNOSIS — I890 Lymphedema, not elsewhere classified: Secondary | ICD-10-CM

## 2021-02-27 NOTE — Progress Notes (Signed)
Orders signed  for skilled nursing for management of lymphedema LLE. Will be faxed to [facility].

## 2021-02-27 NOTE — Telephone Encounter (Signed)
Faxed home health orders to [facility] confirmation-02/27/21.

## 2021-03-03 DIAGNOSIS — I129 Hypertensive chronic kidney disease with stage 1 through stage 4 chronic kidney disease, or unspecified chronic kidney disease: Secondary | ICD-10-CM | POA: Diagnosis not present

## 2021-03-03 DIAGNOSIS — I890 Lymphedema, not elsewhere classified: Secondary | ICD-10-CM | POA: Diagnosis not present

## 2021-03-03 DIAGNOSIS — H833X3 Noise effects on inner ear, bilateral: Secondary | ICD-10-CM | POA: Diagnosis not present

## 2021-03-03 DIAGNOSIS — H9113 Presbycusis, bilateral: Secondary | ICD-10-CM | POA: Diagnosis not present

## 2021-03-03 DIAGNOSIS — N189 Chronic kidney disease, unspecified: Secondary | ICD-10-CM | POA: Diagnosis not present

## 2021-03-03 DIAGNOSIS — M65341 Trigger finger, right ring finger: Secondary | ICD-10-CM | POA: Diagnosis not present

## 2021-03-04 DIAGNOSIS — I129 Hypertensive chronic kidney disease with stage 1 through stage 4 chronic kidney disease, or unspecified chronic kidney disease: Secondary | ICD-10-CM | POA: Diagnosis not present

## 2021-03-04 DIAGNOSIS — N189 Chronic kidney disease, unspecified: Secondary | ICD-10-CM | POA: Diagnosis not present

## 2021-03-04 DIAGNOSIS — M65341 Trigger finger, right ring finger: Secondary | ICD-10-CM | POA: Diagnosis not present

## 2021-03-04 DIAGNOSIS — H9113 Presbycusis, bilateral: Secondary | ICD-10-CM | POA: Diagnosis not present

## 2021-03-04 DIAGNOSIS — I890 Lymphedema, not elsewhere classified: Secondary | ICD-10-CM | POA: Diagnosis not present

## 2021-03-04 DIAGNOSIS — H833X3 Noise effects on inner ear, bilateral: Secondary | ICD-10-CM | POA: Diagnosis not present

## 2021-03-06 DIAGNOSIS — M65341 Trigger finger, right ring finger: Secondary | ICD-10-CM | POA: Diagnosis not present

## 2021-03-06 DIAGNOSIS — H833X3 Noise effects on inner ear, bilateral: Secondary | ICD-10-CM | POA: Diagnosis not present

## 2021-03-06 DIAGNOSIS — I890 Lymphedema, not elsewhere classified: Secondary | ICD-10-CM | POA: Diagnosis not present

## 2021-03-06 DIAGNOSIS — H9113 Presbycusis, bilateral: Secondary | ICD-10-CM | POA: Diagnosis not present

## 2021-03-06 DIAGNOSIS — N189 Chronic kidney disease, unspecified: Secondary | ICD-10-CM | POA: Diagnosis not present

## 2021-03-06 DIAGNOSIS — I129 Hypertensive chronic kidney disease with stage 1 through stage 4 chronic kidney disease, or unspecified chronic kidney disease: Secondary | ICD-10-CM | POA: Diagnosis not present

## 2021-03-10 DIAGNOSIS — I129 Hypertensive chronic kidney disease with stage 1 through stage 4 chronic kidney disease, or unspecified chronic kidney disease: Secondary | ICD-10-CM | POA: Diagnosis not present

## 2021-03-10 DIAGNOSIS — H833X3 Noise effects on inner ear, bilateral: Secondary | ICD-10-CM | POA: Diagnosis not present

## 2021-03-10 DIAGNOSIS — I890 Lymphedema, not elsewhere classified: Secondary | ICD-10-CM | POA: Diagnosis not present

## 2021-03-10 DIAGNOSIS — N189 Chronic kidney disease, unspecified: Secondary | ICD-10-CM | POA: Diagnosis not present

## 2021-03-10 DIAGNOSIS — M65341 Trigger finger, right ring finger: Secondary | ICD-10-CM | POA: Diagnosis not present

## 2021-03-10 DIAGNOSIS — H9113 Presbycusis, bilateral: Secondary | ICD-10-CM | POA: Diagnosis not present

## 2021-03-11 DIAGNOSIS — M65341 Trigger finger, right ring finger: Secondary | ICD-10-CM | POA: Diagnosis not present

## 2021-03-11 DIAGNOSIS — N189 Chronic kidney disease, unspecified: Secondary | ICD-10-CM | POA: Diagnosis not present

## 2021-03-11 DIAGNOSIS — I129 Hypertensive chronic kidney disease with stage 1 through stage 4 chronic kidney disease, or unspecified chronic kidney disease: Secondary | ICD-10-CM | POA: Diagnosis not present

## 2021-03-11 DIAGNOSIS — I890 Lymphedema, not elsewhere classified: Secondary | ICD-10-CM | POA: Diagnosis not present

## 2021-03-11 DIAGNOSIS — H9113 Presbycusis, bilateral: Secondary | ICD-10-CM | POA: Diagnosis not present

## 2021-03-11 DIAGNOSIS — H833X3 Noise effects on inner ear, bilateral: Secondary | ICD-10-CM | POA: Diagnosis not present

## 2021-03-17 DIAGNOSIS — M65341 Trigger finger, right ring finger: Secondary | ICD-10-CM | POA: Diagnosis not present

## 2021-03-17 DIAGNOSIS — H833X3 Noise effects on inner ear, bilateral: Secondary | ICD-10-CM | POA: Diagnosis not present

## 2021-03-17 DIAGNOSIS — N189 Chronic kidney disease, unspecified: Secondary | ICD-10-CM | POA: Diagnosis not present

## 2021-03-17 DIAGNOSIS — I129 Hypertensive chronic kidney disease with stage 1 through stage 4 chronic kidney disease, or unspecified chronic kidney disease: Secondary | ICD-10-CM | POA: Diagnosis not present

## 2021-03-17 DIAGNOSIS — H9113 Presbycusis, bilateral: Secondary | ICD-10-CM | POA: Diagnosis not present

## 2021-03-17 DIAGNOSIS — I890 Lymphedema, not elsewhere classified: Secondary | ICD-10-CM | POA: Diagnosis not present

## 2021-03-19 DIAGNOSIS — H9113 Presbycusis, bilateral: Secondary | ICD-10-CM | POA: Diagnosis not present

## 2021-03-19 DIAGNOSIS — H833X3 Noise effects on inner ear, bilateral: Secondary | ICD-10-CM | POA: Diagnosis not present

## 2021-03-19 DIAGNOSIS — M65341 Trigger finger, right ring finger: Secondary | ICD-10-CM | POA: Diagnosis not present

## 2021-03-19 DIAGNOSIS — I890 Lymphedema, not elsewhere classified: Secondary | ICD-10-CM | POA: Diagnosis not present

## 2021-03-19 DIAGNOSIS — N189 Chronic kidney disease, unspecified: Secondary | ICD-10-CM | POA: Diagnosis not present

## 2021-03-19 DIAGNOSIS — I129 Hypertensive chronic kidney disease with stage 1 through stage 4 chronic kidney disease, or unspecified chronic kidney disease: Secondary | ICD-10-CM | POA: Diagnosis not present

## 2021-03-20 DIAGNOSIS — M65341 Trigger finger, right ring finger: Secondary | ICD-10-CM | POA: Diagnosis not present

## 2021-03-20 DIAGNOSIS — I890 Lymphedema, not elsewhere classified: Secondary | ICD-10-CM | POA: Diagnosis not present

## 2021-03-20 DIAGNOSIS — N189 Chronic kidney disease, unspecified: Secondary | ICD-10-CM | POA: Diagnosis not present

## 2021-03-20 DIAGNOSIS — H9113 Presbycusis, bilateral: Secondary | ICD-10-CM | POA: Diagnosis not present

## 2021-03-20 DIAGNOSIS — H833X3 Noise effects on inner ear, bilateral: Secondary | ICD-10-CM | POA: Diagnosis not present

## 2021-03-20 DIAGNOSIS — I129 Hypertensive chronic kidney disease with stage 1 through stage 4 chronic kidney disease, or unspecified chronic kidney disease: Secondary | ICD-10-CM | POA: Diagnosis not present

## 2021-03-23 DIAGNOSIS — H833X3 Noise effects on inner ear, bilateral: Secondary | ICD-10-CM | POA: Diagnosis not present

## 2021-03-23 DIAGNOSIS — H9113 Presbycusis, bilateral: Secondary | ICD-10-CM | POA: Diagnosis not present

## 2021-03-23 DIAGNOSIS — I129 Hypertensive chronic kidney disease with stage 1 through stage 4 chronic kidney disease, or unspecified chronic kidney disease: Secondary | ICD-10-CM | POA: Diagnosis not present

## 2021-03-23 DIAGNOSIS — I890 Lymphedema, not elsewhere classified: Secondary | ICD-10-CM | POA: Diagnosis not present

## 2021-03-23 DIAGNOSIS — M65341 Trigger finger, right ring finger: Secondary | ICD-10-CM | POA: Diagnosis not present

## 2021-03-23 DIAGNOSIS — N189 Chronic kidney disease, unspecified: Secondary | ICD-10-CM | POA: Diagnosis not present

## 2021-03-24 DIAGNOSIS — I890 Lymphedema, not elsewhere classified: Secondary | ICD-10-CM | POA: Diagnosis not present

## 2021-03-24 DIAGNOSIS — H9113 Presbycusis, bilateral: Secondary | ICD-10-CM | POA: Diagnosis not present

## 2021-03-24 DIAGNOSIS — M65341 Trigger finger, right ring finger: Secondary | ICD-10-CM | POA: Diagnosis not present

## 2021-03-24 DIAGNOSIS — I129 Hypertensive chronic kidney disease with stage 1 through stage 4 chronic kidney disease, or unspecified chronic kidney disease: Secondary | ICD-10-CM | POA: Diagnosis not present

## 2021-03-24 DIAGNOSIS — N189 Chronic kidney disease, unspecified: Secondary | ICD-10-CM | POA: Diagnosis not present

## 2021-03-24 DIAGNOSIS — H833X3 Noise effects on inner ear, bilateral: Secondary | ICD-10-CM | POA: Diagnosis not present

## 2021-03-25 DIAGNOSIS — I700 Atherosclerosis of aorta: Secondary | ICD-10-CM | POA: Diagnosis not present

## 2021-03-25 DIAGNOSIS — Z6841 Body Mass Index (BMI) 40.0 and over, adult: Secondary | ICD-10-CM | POA: Diagnosis not present

## 2021-03-25 DIAGNOSIS — I872 Venous insufficiency (chronic) (peripheral): Secondary | ICD-10-CM | POA: Diagnosis not present

## 2021-03-25 DIAGNOSIS — N1832 Chronic kidney disease, stage 3b: Secondary | ICD-10-CM | POA: Diagnosis not present

## 2021-03-28 DIAGNOSIS — J301 Allergic rhinitis due to pollen: Secondary | ICD-10-CM | POA: Diagnosis not present

## 2021-03-28 DIAGNOSIS — Z9842 Cataract extraction status, left eye: Secondary | ICD-10-CM | POA: Diagnosis not present

## 2021-03-28 DIAGNOSIS — J320 Chronic maxillary sinusitis: Secondary | ICD-10-CM | POA: Diagnosis not present

## 2021-03-28 DIAGNOSIS — G5622 Lesion of ulnar nerve, left upper limb: Secondary | ICD-10-CM | POA: Diagnosis not present

## 2021-03-28 DIAGNOSIS — M109 Gout, unspecified: Secondary | ICD-10-CM | POA: Diagnosis not present

## 2021-03-28 DIAGNOSIS — Z6841 Body Mass Index (BMI) 40.0 and over, adult: Secondary | ICD-10-CM | POA: Diagnosis not present

## 2021-03-28 DIAGNOSIS — I129 Hypertensive chronic kidney disease with stage 1 through stage 4 chronic kidney disease, or unspecified chronic kidney disease: Secondary | ICD-10-CM | POA: Diagnosis not present

## 2021-03-28 DIAGNOSIS — Z87440 Personal history of urinary (tract) infections: Secondary | ICD-10-CM | POA: Diagnosis not present

## 2021-03-28 DIAGNOSIS — H833X3 Noise effects on inner ear, bilateral: Secondary | ICD-10-CM | POA: Diagnosis not present

## 2021-03-28 DIAGNOSIS — I120 Hypertensive chronic kidney disease with stage 5 chronic kidney disease or end stage renal disease: Secondary | ICD-10-CM | POA: Diagnosis not present

## 2021-03-28 DIAGNOSIS — G5602 Carpal tunnel syndrome, left upper limb: Secondary | ICD-10-CM | POA: Diagnosis not present

## 2021-03-28 DIAGNOSIS — D692 Other nonthrombocytopenic purpura: Secondary | ICD-10-CM | POA: Diagnosis not present

## 2021-03-28 DIAGNOSIS — Z9841 Cataract extraction status, right eye: Secondary | ICD-10-CM | POA: Diagnosis not present

## 2021-03-28 DIAGNOSIS — H9113 Presbycusis, bilateral: Secondary | ICD-10-CM | POA: Diagnosis not present

## 2021-03-28 DIAGNOSIS — I872 Venous insufficiency (chronic) (peripheral): Secondary | ICD-10-CM | POA: Diagnosis not present

## 2021-03-28 DIAGNOSIS — I890 Lymphedema, not elsewhere classified: Secondary | ICD-10-CM | POA: Diagnosis not present

## 2021-03-28 DIAGNOSIS — Z96641 Presence of right artificial hip joint: Secondary | ICD-10-CM | POA: Diagnosis not present

## 2021-03-28 DIAGNOSIS — M65341 Trigger finger, right ring finger: Secondary | ICD-10-CM | POA: Diagnosis not present

## 2021-03-28 DIAGNOSIS — B351 Tinea unguium: Secondary | ICD-10-CM | POA: Diagnosis not present

## 2021-03-28 DIAGNOSIS — I739 Peripheral vascular disease, unspecified: Secondary | ICD-10-CM | POA: Diagnosis not present

## 2021-03-28 DIAGNOSIS — Z9181 History of falling: Secondary | ICD-10-CM | POA: Diagnosis not present

## 2021-03-28 DIAGNOSIS — N189 Chronic kidney disease, unspecified: Secondary | ICD-10-CM | POA: Diagnosis not present

## 2021-03-28 DIAGNOSIS — Z9089 Acquired absence of other organs: Secondary | ICD-10-CM | POA: Diagnosis not present

## 2021-03-28 DIAGNOSIS — K5790 Diverticulosis of intestine, part unspecified, without perforation or abscess without bleeding: Secondary | ICD-10-CM | POA: Diagnosis not present

## 2021-04-01 DIAGNOSIS — I129 Hypertensive chronic kidney disease with stage 1 through stage 4 chronic kidney disease, or unspecified chronic kidney disease: Secondary | ICD-10-CM | POA: Diagnosis not present

## 2021-04-01 DIAGNOSIS — M65341 Trigger finger, right ring finger: Secondary | ICD-10-CM | POA: Diagnosis not present

## 2021-04-01 DIAGNOSIS — I890 Lymphedema, not elsewhere classified: Secondary | ICD-10-CM | POA: Diagnosis not present

## 2021-04-01 DIAGNOSIS — N189 Chronic kidney disease, unspecified: Secondary | ICD-10-CM | POA: Diagnosis not present

## 2021-04-01 DIAGNOSIS — H9113 Presbycusis, bilateral: Secondary | ICD-10-CM | POA: Diagnosis not present

## 2021-04-01 DIAGNOSIS — H833X3 Noise effects on inner ear, bilateral: Secondary | ICD-10-CM | POA: Diagnosis not present

## 2021-04-02 DIAGNOSIS — I890 Lymphedema, not elsewhere classified: Secondary | ICD-10-CM | POA: Diagnosis not present

## 2021-04-02 DIAGNOSIS — N189 Chronic kidney disease, unspecified: Secondary | ICD-10-CM | POA: Diagnosis not present

## 2021-04-02 DIAGNOSIS — H9113 Presbycusis, bilateral: Secondary | ICD-10-CM | POA: Diagnosis not present

## 2021-04-02 DIAGNOSIS — M65341 Trigger finger, right ring finger: Secondary | ICD-10-CM | POA: Diagnosis not present

## 2021-04-02 DIAGNOSIS — H833X3 Noise effects on inner ear, bilateral: Secondary | ICD-10-CM | POA: Diagnosis not present

## 2021-04-02 DIAGNOSIS — I129 Hypertensive chronic kidney disease with stage 1 through stage 4 chronic kidney disease, or unspecified chronic kidney disease: Secondary | ICD-10-CM | POA: Diagnosis not present

## 2021-04-07 DIAGNOSIS — I129 Hypertensive chronic kidney disease with stage 1 through stage 4 chronic kidney disease, or unspecified chronic kidney disease: Secondary | ICD-10-CM | POA: Diagnosis not present

## 2021-04-07 DIAGNOSIS — I890 Lymphedema, not elsewhere classified: Secondary | ICD-10-CM | POA: Diagnosis not present

## 2021-04-07 DIAGNOSIS — M65341 Trigger finger, right ring finger: Secondary | ICD-10-CM | POA: Diagnosis not present

## 2021-04-07 DIAGNOSIS — H833X3 Noise effects on inner ear, bilateral: Secondary | ICD-10-CM | POA: Diagnosis not present

## 2021-04-07 DIAGNOSIS — N189 Chronic kidney disease, unspecified: Secondary | ICD-10-CM | POA: Diagnosis not present

## 2021-04-07 DIAGNOSIS — H9113 Presbycusis, bilateral: Secondary | ICD-10-CM | POA: Diagnosis not present

## 2021-04-09 DIAGNOSIS — M65341 Trigger finger, right ring finger: Secondary | ICD-10-CM | POA: Diagnosis not present

## 2021-04-09 DIAGNOSIS — H833X3 Noise effects on inner ear, bilateral: Secondary | ICD-10-CM | POA: Diagnosis not present

## 2021-04-09 DIAGNOSIS — H9113 Presbycusis, bilateral: Secondary | ICD-10-CM | POA: Diagnosis not present

## 2021-04-09 DIAGNOSIS — I129 Hypertensive chronic kidney disease with stage 1 through stage 4 chronic kidney disease, or unspecified chronic kidney disease: Secondary | ICD-10-CM | POA: Diagnosis not present

## 2021-04-09 DIAGNOSIS — N189 Chronic kidney disease, unspecified: Secondary | ICD-10-CM | POA: Diagnosis not present

## 2021-04-09 DIAGNOSIS — I890 Lymphedema, not elsewhere classified: Secondary | ICD-10-CM | POA: Diagnosis not present

## 2021-04-14 DIAGNOSIS — I890 Lymphedema, not elsewhere classified: Secondary | ICD-10-CM | POA: Diagnosis not present

## 2021-04-14 DIAGNOSIS — H9113 Presbycusis, bilateral: Secondary | ICD-10-CM | POA: Diagnosis not present

## 2021-04-14 DIAGNOSIS — I129 Hypertensive chronic kidney disease with stage 1 through stage 4 chronic kidney disease, or unspecified chronic kidney disease: Secondary | ICD-10-CM | POA: Diagnosis not present

## 2021-04-14 DIAGNOSIS — N189 Chronic kidney disease, unspecified: Secondary | ICD-10-CM | POA: Diagnosis not present

## 2021-04-14 DIAGNOSIS — M65341 Trigger finger, right ring finger: Secondary | ICD-10-CM | POA: Diagnosis not present

## 2021-04-14 DIAGNOSIS — H833X3 Noise effects on inner ear, bilateral: Secondary | ICD-10-CM | POA: Diagnosis not present

## 2021-04-25 DIAGNOSIS — M65341 Trigger finger, right ring finger: Secondary | ICD-10-CM | POA: Diagnosis not present

## 2021-04-25 DIAGNOSIS — H833X3 Noise effects on inner ear, bilateral: Secondary | ICD-10-CM | POA: Diagnosis not present

## 2021-04-25 DIAGNOSIS — H9113 Presbycusis, bilateral: Secondary | ICD-10-CM | POA: Diagnosis not present

## 2021-04-25 DIAGNOSIS — I129 Hypertensive chronic kidney disease with stage 1 through stage 4 chronic kidney disease, or unspecified chronic kidney disease: Secondary | ICD-10-CM | POA: Diagnosis not present

## 2021-04-25 DIAGNOSIS — I890 Lymphedema, not elsewhere classified: Secondary | ICD-10-CM | POA: Diagnosis not present

## 2021-04-25 DIAGNOSIS — N189 Chronic kidney disease, unspecified: Secondary | ICD-10-CM | POA: Diagnosis not present

## 2021-04-26 DIAGNOSIS — H833X3 Noise effects on inner ear, bilateral: Secondary | ICD-10-CM | POA: Diagnosis not present

## 2021-04-26 DIAGNOSIS — H9113 Presbycusis, bilateral: Secondary | ICD-10-CM | POA: Diagnosis not present

## 2021-04-26 DIAGNOSIS — N189 Chronic kidney disease, unspecified: Secondary | ICD-10-CM | POA: Diagnosis not present

## 2021-04-26 DIAGNOSIS — I890 Lymphedema, not elsewhere classified: Secondary | ICD-10-CM | POA: Diagnosis not present

## 2021-04-26 DIAGNOSIS — M65341 Trigger finger, right ring finger: Secondary | ICD-10-CM | POA: Diagnosis not present

## 2021-04-26 DIAGNOSIS — I129 Hypertensive chronic kidney disease with stage 1 through stage 4 chronic kidney disease, or unspecified chronic kidney disease: Secondary | ICD-10-CM | POA: Diagnosis not present

## 2021-04-27 ENCOUNTER — Telehealth: Payer: Self-pay | Admitting: Podiatry

## 2021-04-27 DIAGNOSIS — H9113 Presbycusis, bilateral: Secondary | ICD-10-CM | POA: Diagnosis not present

## 2021-04-27 DIAGNOSIS — Z87440 Personal history of urinary (tract) infections: Secondary | ICD-10-CM | POA: Diagnosis not present

## 2021-04-27 DIAGNOSIS — Z9089 Acquired absence of other organs: Secondary | ICD-10-CM | POA: Diagnosis not present

## 2021-04-27 DIAGNOSIS — J320 Chronic maxillary sinusitis: Secondary | ICD-10-CM | POA: Diagnosis not present

## 2021-04-27 DIAGNOSIS — I120 Hypertensive chronic kidney disease with stage 5 chronic kidney disease or end stage renal disease: Secondary | ICD-10-CM | POA: Diagnosis not present

## 2021-04-27 DIAGNOSIS — Z6841 Body Mass Index (BMI) 40.0 and over, adult: Secondary | ICD-10-CM | POA: Diagnosis not present

## 2021-04-27 DIAGNOSIS — J301 Allergic rhinitis due to pollen: Secondary | ICD-10-CM | POA: Diagnosis not present

## 2021-04-27 DIAGNOSIS — M65341 Trigger finger, right ring finger: Secondary | ICD-10-CM | POA: Diagnosis not present

## 2021-04-27 DIAGNOSIS — I872 Venous insufficiency (chronic) (peripheral): Secondary | ICD-10-CM | POA: Diagnosis not present

## 2021-04-27 DIAGNOSIS — I739 Peripheral vascular disease, unspecified: Secondary | ICD-10-CM | POA: Diagnosis not present

## 2021-04-27 DIAGNOSIS — Z9842 Cataract extraction status, left eye: Secondary | ICD-10-CM | POA: Diagnosis not present

## 2021-04-27 DIAGNOSIS — Z9181 History of falling: Secondary | ICD-10-CM | POA: Diagnosis not present

## 2021-04-27 DIAGNOSIS — I890 Lymphedema, not elsewhere classified: Secondary | ICD-10-CM | POA: Diagnosis not present

## 2021-04-27 DIAGNOSIS — H833X3 Noise effects on inner ear, bilateral: Secondary | ICD-10-CM | POA: Diagnosis not present

## 2021-04-27 DIAGNOSIS — K5790 Diverticulosis of intestine, part unspecified, without perforation or abscess without bleeding: Secondary | ICD-10-CM | POA: Diagnosis not present

## 2021-04-27 DIAGNOSIS — G5602 Carpal tunnel syndrome, left upper limb: Secondary | ICD-10-CM | POA: Diagnosis not present

## 2021-04-27 DIAGNOSIS — M109 Gout, unspecified: Secondary | ICD-10-CM | POA: Diagnosis not present

## 2021-04-27 DIAGNOSIS — B351 Tinea unguium: Secondary | ICD-10-CM | POA: Diagnosis not present

## 2021-04-27 DIAGNOSIS — Z96641 Presence of right artificial hip joint: Secondary | ICD-10-CM | POA: Diagnosis not present

## 2021-04-27 DIAGNOSIS — I129 Hypertensive chronic kidney disease with stage 1 through stage 4 chronic kidney disease, or unspecified chronic kidney disease: Secondary | ICD-10-CM | POA: Diagnosis not present

## 2021-04-27 DIAGNOSIS — N189 Chronic kidney disease, unspecified: Secondary | ICD-10-CM | POA: Diagnosis not present

## 2021-04-27 DIAGNOSIS — G5622 Lesion of ulnar nerve, left upper limb: Secondary | ICD-10-CM | POA: Diagnosis not present

## 2021-04-27 DIAGNOSIS — D692 Other nonthrombocytopenic purpura: Secondary | ICD-10-CM | POA: Diagnosis not present

## 2021-04-27 DIAGNOSIS — Z9841 Cataract extraction status, right eye: Secondary | ICD-10-CM | POA: Diagnosis not present

## 2021-04-27 NOTE — Telephone Encounter (Signed)
Tonya requested nursing order approval for the next 6 weeks effective 4/30 to see patient once a week, every other week.  ?

## 2021-04-30 ENCOUNTER — Telehealth: Payer: Self-pay | Admitting: *Deleted

## 2021-04-30 NOTE — Telephone Encounter (Addendum)
Tanya,nurse w/ [facility] is calling for nursing re certification orders effective 05/10/21.(1 week every 2 weeks, for 6 weeks effective 04/30.this is for continuing education on lymphedema, venus insuffiencey, peripheral vascular. Please advise. ?Called [facility](Tanya) , giving approval on verbal orders per Dr Damaris Schooner understanding. ?

## 2021-05-01 ENCOUNTER — Telehealth: Payer: Self-pay | Admitting: Podiatry

## 2021-05-01 ENCOUNTER — Other Ambulatory Visit (INDEPENDENT_AMBULATORY_CARE_PROVIDER_SITE_OTHER): Payer: Medicare Other | Admitting: Podiatry

## 2021-05-01 DIAGNOSIS — I872 Venous insufficiency (chronic) (peripheral): Secondary | ICD-10-CM

## 2021-05-01 DIAGNOSIS — I890 Lymphedema, not elsewhere classified: Secondary | ICD-10-CM

## 2021-05-01 NOTE — Telephone Encounter (Signed)
Called and gave verbal per Dr Elisha Ponder to Kenney Houseman, nurse with [facility] ?Faxed information to [facility] from Dr Elisha Ponder.05/01/21. ?

## 2021-05-01 NOTE — Telephone Encounter (Signed)
Patient missed his PT on 04/20 due to other appointments ?

## 2021-05-07 DIAGNOSIS — I890 Lymphedema, not elsewhere classified: Secondary | ICD-10-CM | POA: Diagnosis not present

## 2021-05-07 DIAGNOSIS — H9113 Presbycusis, bilateral: Secondary | ICD-10-CM | POA: Diagnosis not present

## 2021-05-07 DIAGNOSIS — H833X3 Noise effects on inner ear, bilateral: Secondary | ICD-10-CM | POA: Diagnosis not present

## 2021-05-07 DIAGNOSIS — N189 Chronic kidney disease, unspecified: Secondary | ICD-10-CM | POA: Diagnosis not present

## 2021-05-07 DIAGNOSIS — M65341 Trigger finger, right ring finger: Secondary | ICD-10-CM | POA: Diagnosis not present

## 2021-05-07 DIAGNOSIS — I129 Hypertensive chronic kidney disease with stage 1 through stage 4 chronic kidney disease, or unspecified chronic kidney disease: Secondary | ICD-10-CM | POA: Diagnosis not present

## 2021-05-11 ENCOUNTER — Encounter: Payer: Self-pay | Admitting: Podiatry

## 2021-05-11 ENCOUNTER — Ambulatory Visit (INDEPENDENT_AMBULATORY_CARE_PROVIDER_SITE_OTHER): Payer: Medicare Other | Admitting: Podiatry

## 2021-05-11 DIAGNOSIS — I872 Venous insufficiency (chronic) (peripheral): Secondary | ICD-10-CM

## 2021-05-11 DIAGNOSIS — R600 Localized edema: Secondary | ICD-10-CM

## 2021-05-11 DIAGNOSIS — M79674 Pain in right toe(s): Secondary | ICD-10-CM

## 2021-05-11 DIAGNOSIS — M79675 Pain in left toe(s): Secondary | ICD-10-CM | POA: Diagnosis not present

## 2021-05-11 DIAGNOSIS — I890 Lymphedema, not elsewhere classified: Secondary | ICD-10-CM

## 2021-05-11 DIAGNOSIS — B351 Tinea unguium: Secondary | ICD-10-CM

## 2021-05-12 DIAGNOSIS — M65341 Trigger finger, right ring finger: Secondary | ICD-10-CM | POA: Diagnosis not present

## 2021-05-12 DIAGNOSIS — I890 Lymphedema, not elsewhere classified: Secondary | ICD-10-CM | POA: Diagnosis not present

## 2021-05-12 DIAGNOSIS — N189 Chronic kidney disease, unspecified: Secondary | ICD-10-CM | POA: Diagnosis not present

## 2021-05-12 DIAGNOSIS — I129 Hypertensive chronic kidney disease with stage 1 through stage 4 chronic kidney disease, or unspecified chronic kidney disease: Secondary | ICD-10-CM | POA: Diagnosis not present

## 2021-05-12 DIAGNOSIS — H833X3 Noise effects on inner ear, bilateral: Secondary | ICD-10-CM | POA: Diagnosis not present

## 2021-05-12 DIAGNOSIS — H9113 Presbycusis, bilateral: Secondary | ICD-10-CM | POA: Diagnosis not present

## 2021-05-18 NOTE — Progress Notes (Signed)
?  Subjective:  ?Patient ID: Christopher [name], male    DOB: [birth year],  MRN: [id number] ? ?Christopher [name] presents to clinic today for painful elongated mycotic toenails 1-5 bilaterally which are tender when wearing enclosed shoe gear. Pain is relieved with periodic professional debridement. ? ?He has h/o venous insufficiency with chronic LE edema L\>R. He is very happy to receive care from [facility] who have helped him with minimizing LE edema. ? ?New problem(s): None.  ? ?PCP is [name], MD , and last visit was March 25, 2021. ? ?Allergies  ?Allergen Reactions  ? Chicken Allergy Anaphylaxis  ?  Other reaction(s): Unknown  ? Other Anaphylaxis and Swelling  ?  Chicken  ? Mirabegron   ?  Other reaction(s): side pain  ? Penicillins Itching and Swelling  ? Poultry Meal Swelling  ? ? ?Review of Systems: Negative except as noted in the HPI. ? ?Objective:  ?General: Patient is a pleasant 88 y.o. Caucasian male, morbidly obese in NAD. AAO x 3.  ? ?Neurovascular Examination: ?Capillary refill time to digits immediate b/l lower extremities. Palpable DP pulse(s) b/l lower extremities Palpable PT pulse(s) b/l lower extremities Pedal hair absent. Lower extremity skin temperature gradient within normal limits. Dependent rubor noted left lower extremity. No pain with calf compression b/l. Evidence of chronic venous insufficiency b/l lower extremities. ? ?Protective sensation intact 5/5 intact bilaterally with 10g monofilament b/l. ? ?Dermatological:  ?Skin warm and supple b/l lower extremities. No open wounds b/l LE. No interdigital macerations b/l lower extremities. Toenails 1-5 b/l elongated, discolored, dystrophic, thickened, crumbly with subungual debris and tenderness to dorsal palpation. ? ?Musculoskeletal:  ?Normal muscle strength 5/5 to all lower extremity muscle groups bilaterally. No gross bony deformities b/l lower extremities. Ambulates independently without assistive aids. ? ?Assessment/Plan: ?1. Pain  due to onychomycosis of toenails of both feet   ?2. Lymphedema of left lower extremity   ?3. Chronic venous insufficiency of lower extremity   ?4. Bilateral lower extremity edema   ?  ?-Patient was evaluated and treated. All patient's and/or POA's questions/concerns answered on today's visit. ?-Patient receiving home health to assist with LE edema/ADLs. Continue. Recertification signed and sent to [facility]. ?-Patient to continue soft, supportive shoe gear daily. ?-Continue compression hose daily to assist with minimizing LE edema. ?-Patient/POA to call should there be question/concern in the interim.  ? ?Return in about 3 months (around 08/11/2021). ? ?Marzetta Board, DPM  ?

## 2021-05-25 DIAGNOSIS — I129 Hypertensive chronic kidney disease with stage 1 through stage 4 chronic kidney disease, or unspecified chronic kidney disease: Secondary | ICD-10-CM | POA: Diagnosis not present

## 2021-05-25 DIAGNOSIS — H9113 Presbycusis, bilateral: Secondary | ICD-10-CM | POA: Diagnosis not present

## 2021-05-25 DIAGNOSIS — H833X3 Noise effects on inner ear, bilateral: Secondary | ICD-10-CM | POA: Diagnosis not present

## 2021-05-25 DIAGNOSIS — N189 Chronic kidney disease, unspecified: Secondary | ICD-10-CM | POA: Diagnosis not present

## 2021-05-25 DIAGNOSIS — M65341 Trigger finger, right ring finger: Secondary | ICD-10-CM | POA: Diagnosis not present

## 2021-05-25 DIAGNOSIS — I890 Lymphedema, not elsewhere classified: Secondary | ICD-10-CM | POA: Diagnosis not present

## 2021-05-27 DIAGNOSIS — Z87440 Personal history of urinary (tract) infections: Secondary | ICD-10-CM | POA: Diagnosis not present

## 2021-05-27 DIAGNOSIS — H833X3 Noise effects on inner ear, bilateral: Secondary | ICD-10-CM | POA: Diagnosis not present

## 2021-05-27 DIAGNOSIS — N189 Chronic kidney disease, unspecified: Secondary | ICD-10-CM | POA: Diagnosis not present

## 2021-05-27 DIAGNOSIS — J320 Chronic maxillary sinusitis: Secondary | ICD-10-CM | POA: Diagnosis not present

## 2021-05-27 DIAGNOSIS — I872 Venous insufficiency (chronic) (peripheral): Secondary | ICD-10-CM | POA: Diagnosis not present

## 2021-05-27 DIAGNOSIS — Z9842 Cataract extraction status, left eye: Secondary | ICD-10-CM | POA: Diagnosis not present

## 2021-05-27 DIAGNOSIS — D692 Other nonthrombocytopenic purpura: Secondary | ICD-10-CM | POA: Diagnosis not present

## 2021-05-27 DIAGNOSIS — Z6841 Body Mass Index (BMI) 40.0 and over, adult: Secondary | ICD-10-CM | POA: Diagnosis not present

## 2021-05-27 DIAGNOSIS — Z96641 Presence of right artificial hip joint: Secondary | ICD-10-CM | POA: Diagnosis not present

## 2021-05-27 DIAGNOSIS — G5622 Lesion of ulnar nerve, left upper limb: Secondary | ICD-10-CM | POA: Diagnosis not present

## 2021-05-27 DIAGNOSIS — I120 Hypertensive chronic kidney disease with stage 5 chronic kidney disease or end stage renal disease: Secondary | ICD-10-CM | POA: Diagnosis not present

## 2021-05-27 DIAGNOSIS — J301 Allergic rhinitis due to pollen: Secondary | ICD-10-CM | POA: Diagnosis not present

## 2021-05-27 DIAGNOSIS — I739 Peripheral vascular disease, unspecified: Secondary | ICD-10-CM | POA: Diagnosis not present

## 2021-05-27 DIAGNOSIS — Z9089 Acquired absence of other organs: Secondary | ICD-10-CM | POA: Diagnosis not present

## 2021-05-27 DIAGNOSIS — M109 Gout, unspecified: Secondary | ICD-10-CM | POA: Diagnosis not present

## 2021-05-27 DIAGNOSIS — H9113 Presbycusis, bilateral: Secondary | ICD-10-CM | POA: Diagnosis not present

## 2021-05-27 DIAGNOSIS — I890 Lymphedema, not elsewhere classified: Secondary | ICD-10-CM | POA: Diagnosis not present

## 2021-05-27 DIAGNOSIS — I129 Hypertensive chronic kidney disease with stage 1 through stage 4 chronic kidney disease, or unspecified chronic kidney disease: Secondary | ICD-10-CM | POA: Diagnosis not present

## 2021-05-27 DIAGNOSIS — K5790 Diverticulosis of intestine, part unspecified, without perforation or abscess without bleeding: Secondary | ICD-10-CM | POA: Diagnosis not present

## 2021-05-27 DIAGNOSIS — B351 Tinea unguium: Secondary | ICD-10-CM | POA: Diagnosis not present

## 2021-05-27 DIAGNOSIS — Z9181 History of falling: Secondary | ICD-10-CM | POA: Diagnosis not present

## 2021-05-27 DIAGNOSIS — M65341 Trigger finger, right ring finger: Secondary | ICD-10-CM | POA: Diagnosis not present

## 2021-05-27 DIAGNOSIS — G5602 Carpal tunnel syndrome, left upper limb: Secondary | ICD-10-CM | POA: Diagnosis not present

## 2021-05-27 DIAGNOSIS — Z9841 Cataract extraction status, right eye: Secondary | ICD-10-CM | POA: Diagnosis not present

## 2021-06-04 DIAGNOSIS — I872 Venous insufficiency (chronic) (peripheral): Secondary | ICD-10-CM | POA: Diagnosis not present

## 2021-06-04 DIAGNOSIS — I10 Essential (primary) hypertension: Secondary | ICD-10-CM | POA: Diagnosis not present

## 2021-06-10 DIAGNOSIS — I890 Lymphedema, not elsewhere classified: Secondary | ICD-10-CM | POA: Diagnosis not present

## 2021-06-10 DIAGNOSIS — I129 Hypertensive chronic kidney disease with stage 1 through stage 4 chronic kidney disease, or unspecified chronic kidney disease: Secondary | ICD-10-CM | POA: Diagnosis not present

## 2021-06-10 DIAGNOSIS — M65341 Trigger finger, right ring finger: Secondary | ICD-10-CM | POA: Diagnosis not present

## 2021-06-10 DIAGNOSIS — H833X3 Noise effects on inner ear, bilateral: Secondary | ICD-10-CM | POA: Diagnosis not present

## 2021-06-10 DIAGNOSIS — H9113 Presbycusis, bilateral: Secondary | ICD-10-CM | POA: Diagnosis not present

## 2021-06-10 DIAGNOSIS — N189 Chronic kidney disease, unspecified: Secondary | ICD-10-CM | POA: Diagnosis not present

## 2021-06-12 DIAGNOSIS — H9113 Presbycusis, bilateral: Secondary | ICD-10-CM | POA: Diagnosis not present

## 2021-06-12 DIAGNOSIS — H833X3 Noise effects on inner ear, bilateral: Secondary | ICD-10-CM | POA: Diagnosis not present

## 2021-06-12 DIAGNOSIS — M65341 Trigger finger, right ring finger: Secondary | ICD-10-CM | POA: Diagnosis not present

## 2021-06-12 DIAGNOSIS — N189 Chronic kidney disease, unspecified: Secondary | ICD-10-CM | POA: Diagnosis not present

## 2021-06-12 DIAGNOSIS — I890 Lymphedema, not elsewhere classified: Secondary | ICD-10-CM | POA: Diagnosis not present

## 2021-06-12 DIAGNOSIS — I129 Hypertensive chronic kidney disease with stage 1 through stage 4 chronic kidney disease, or unspecified chronic kidney disease: Secondary | ICD-10-CM | POA: Diagnosis not present

## 2021-06-24 DIAGNOSIS — N189 Chronic kidney disease, unspecified: Secondary | ICD-10-CM | POA: Diagnosis not present

## 2021-06-24 DIAGNOSIS — H9113 Presbycusis, bilateral: Secondary | ICD-10-CM | POA: Diagnosis not present

## 2021-06-24 DIAGNOSIS — M65341 Trigger finger, right ring finger: Secondary | ICD-10-CM | POA: Diagnosis not present

## 2021-06-24 DIAGNOSIS — I890 Lymphedema, not elsewhere classified: Secondary | ICD-10-CM | POA: Diagnosis not present

## 2021-06-24 DIAGNOSIS — H833X3 Noise effects on inner ear, bilateral: Secondary | ICD-10-CM | POA: Diagnosis not present

## 2021-06-24 DIAGNOSIS — I129 Hypertensive chronic kidney disease with stage 1 through stage 4 chronic kidney disease, or unspecified chronic kidney disease: Secondary | ICD-10-CM | POA: Diagnosis not present

## 2021-07-31 DIAGNOSIS — M79605 Pain in left leg: Secondary | ICD-10-CM | POA: Diagnosis not present

## 2021-07-31 DIAGNOSIS — L03116 Cellulitis of left lower limb: Secondary | ICD-10-CM | POA: Diagnosis not present

## 2021-07-31 DIAGNOSIS — R21 Rash and other nonspecific skin eruption: Secondary | ICD-10-CM | POA: Diagnosis not present

## 2021-07-31 DIAGNOSIS — R600 Localized edema: Secondary | ICD-10-CM | POA: Diagnosis not present

## 2021-07-31 DIAGNOSIS — N1832 Chronic kidney disease, stage 3b: Secondary | ICD-10-CM | POA: Diagnosis not present

## 2021-08-11 ENCOUNTER — Ambulatory Visit (INDEPENDENT_AMBULATORY_CARE_PROVIDER_SITE_OTHER): Payer: Medicare Other | Admitting: Podiatry

## 2021-08-11 ENCOUNTER — Encounter: Payer: Self-pay | Admitting: Podiatry

## 2021-08-11 DIAGNOSIS — M79675 Pain in left toe(s): Secondary | ICD-10-CM | POA: Diagnosis not present

## 2021-08-11 DIAGNOSIS — B351 Tinea unguium: Secondary | ICD-10-CM | POA: Diagnosis not present

## 2021-08-11 DIAGNOSIS — M79674 Pain in right toe(s): Secondary | ICD-10-CM

## 2021-08-16 NOTE — Progress Notes (Signed)
  Subjective:  Patient ID: Christopher [name], male    DOB: Jul 27, 1928,  MRN: [id number]  Christopher [name] presents to clinic today for at risk foot care with h/o CKD and painful thick toenails that are difficult to trim. Pain interferes with ambulation. Aggravating factors include wearing enclosed shoe gear. Pain is relieved with periodic professional debridement.  He has h/o lower extremity edema L>R being managed by Dr.Stoneking.  New problem(s): None.   PCP is [name], MD , and last visit was  July 31, 2021  Allergies  Allergen Reactions   Chicken Allergy Anaphylaxis    Other reaction(s): Unknown   Other Anaphylaxis and Swelling    Chicken   Mirabegron     Other reaction(s): side pain   Penicillins Itching and Swelling   Poultry Meal Swelling    Review of Systems: Negative except as noted in the HPI.  Objective: No changes noted in today's physical examination. General: Patient is a pleasant 88 y.o. Caucasian male, morbidly obese in NAD. AAO x 3.   Neurovascular Examination: Capillary refill time to digits immediate b/l lower extremities. Palpable DP pulse(s) b/l lower extremities Palpable PT pulse(s) b/l lower extremities Pedal hair absent. Lower extremity skin temperature gradient within normal limits. Dependent rubor noted left lower extremity. No pain with calf compression b/l. Evidence of chronic venous insufficiency b/l lower extremities.  Protective sensation intact 5/5 intact bilaterally with 10g monofilament b/l.  Dermatological:  Skin warm and supple b/l lower extremities. No open wounds b/l LE. No interdigital macerations b/l lower extremities. Toenails 1-5 b/l elongated, discolored, dystrophic, thickened, crumbly with subungual debris and tenderness to dorsal palpation.  Musculoskeletal:  Normal muscle strength 5/5 to all lower extremity muscle groups bilaterally. No gross bony deformities b/l lower extremities. Ambulates independently without assistive  aids.  Assessment/Plan: 1. Pain due to onychomycosis of toenails of both feet     -Examined patient. -Patient's LE edema managed by Dr. Felipa Eth. -Patient to continue soft, supportive shoe gear daily. -Mycotic toenails 1-5 bilaterally were debrided in length and girth with sterile nail nippers and dremel without incident. -Patient/POA to call should there be question/concern in the interim.   Return in about 3 months (around 11/11/2021).  Marzetta Board, DPM

## 2021-08-25 DIAGNOSIS — D692 Other nonthrombocytopenic purpura: Secondary | ICD-10-CM | POA: Diagnosis not present

## 2021-08-25 DIAGNOSIS — D3617 Benign neoplasm of peripheral nerves and autonomic nervous system of trunk, unspecified: Secondary | ICD-10-CM | POA: Diagnosis not present

## 2021-08-25 DIAGNOSIS — Z85828 Personal history of other malignant neoplasm of skin: Secondary | ICD-10-CM | POA: Diagnosis not present

## 2021-08-25 DIAGNOSIS — L570 Actinic keratosis: Secondary | ICD-10-CM | POA: Diagnosis not present

## 2021-08-25 DIAGNOSIS — D485 Neoplasm of uncertain behavior of skin: Secondary | ICD-10-CM | POA: Diagnosis not present

## 2021-08-31 DIAGNOSIS — Z0000 Encounter for general adult medical examination without abnormal findings: Secondary | ICD-10-CM | POA: Diagnosis not present

## 2021-08-31 DIAGNOSIS — I872 Venous insufficiency (chronic) (peripheral): Secondary | ICD-10-CM | POA: Diagnosis not present

## 2021-08-31 DIAGNOSIS — M25511 Pain in right shoulder: Secondary | ICD-10-CM | POA: Diagnosis not present

## 2021-09-02 ENCOUNTER — Ambulatory Visit (INDEPENDENT_AMBULATORY_CARE_PROVIDER_SITE_OTHER): Payer: Medicare Other

## 2021-09-02 ENCOUNTER — Ambulatory Visit (INDEPENDENT_AMBULATORY_CARE_PROVIDER_SITE_OTHER): Payer: Medicare Other | Admitting: Orthopaedic Surgery

## 2021-09-02 ENCOUNTER — Encounter: Payer: Self-pay | Admitting: Orthopaedic Surgery

## 2021-09-02 DIAGNOSIS — G8929 Other chronic pain: Secondary | ICD-10-CM

## 2021-09-02 DIAGNOSIS — M25511 Pain in right shoulder: Secondary | ICD-10-CM

## 2021-09-02 MED ORDER — METHYLPREDNISOLONE ACETATE 40 MG/ML IJ SUSP
80.0000 mg | INTRAMUSCULAR | Status: AC | PRN
  Administered 2021-09-02: 80 mg via INTRA_ARTICULAR

## 2021-09-02 MED ORDER — LIDOCAINE HCL 2 % IJ SOLN
2.0000 mL | INTRAMUSCULAR | Status: AC | PRN
  Administered 2021-09-02: 2 mL

## 2021-09-02 MED ORDER — BUPIVACAINE HCL 0.25 % IJ SOLN
2.0000 mL | INTRAMUSCULAR | Status: AC | PRN
  Administered 2021-09-02: 2 mL via INTRA_ARTICULAR

## 2021-09-02 NOTE — Progress Notes (Signed)
Office Visit Note   Patient: Christopher [name]           Date of Birth: Sep 27, 1928           MRN: [id number] Visit Date: 09/02/2021              Requested by: [name], MD [address] [place],  [place] [address] PCP: [name], MD   Assessment & Plan: Visit Diagnoses:  1. Chronic right shoulder pain     Plan: Mr. On relates a chronic history of right shoulder pain has been ongoing for "several years".  Recently had an exacerbation without injury or trauma.  He is right-hand dominant.  He is having some difficulty raising his arm over his head.  His past history is significant that he had a rotator cuff tear repair about 20 years ago on the left side.  He certainly has evidence of impingement with some mild weakness on the right.  X-rays were nondiagnostic.  He certainly could have a rotator cuff tear.  Like to try a subacromial cortisone injection first.  Might want to consider an MRI scan if no improvement.  He will let me know  Follow-Up Instructions: Return if symptoms worsen or fail to improve.   Orders:  Orders Placed This Encounter  Procedures   XR Shoulder Right   No orders of the defined types were placed in this encounter.     Procedures: Large Joint Inj: R subacromial bursa on 09/02/2021 3:48 PM Indications: pain and diagnostic evaluation Details: 25 G 1.5 in needle, anterolateral approach  Arthrogram: No  Medications: 2 mL lidocaine 2 %; 80 mg methylPREDNISolone acetate 40 MG/ML; 2 mL bupivacaine 0.25 % Consent was given by the patient. Immediately prior to procedure a time out was called to verify the correct patient, procedure, equipment, support staff and site/side marked as required. Patient was prepped and draped in the usual sterile fashion.       Clinical Data: No additional findings.   Subjective: Chief Complaint  Patient presents with   Right Shoulder - Follow-up   Patient presents today to discuss his right shoulder  pain. Patient states that he has been having ongoing right shoulder pain for years however has gradually been getting worse. He reports that he is right hand dominant however has not been having any radiating pains from his right shoulder into his arm.   Review of Systems   Objective: Vital Signs: There were no vitals taken for this visit.  Physical Exam Constitutional:      Appearance: He is well-developed.  Pulmonary:     Effort: Pulmonary effort is normal.  Skin:    General: Skin is warm and dry.  Neurological:     Mental Status: He is alert and oriented to person, place, and time.  Psychiatric:        Behavior: Behavior normal.     Ortho Exam awake alert and oriented x3.  Comfortable sitting.  Had a painful arc of motion with the right shoulder.  Was Christopher to place it almost fully overhead.  There was positive impingement but without grating or crepitation.  Some mild anterior and lateral subacromial pain.  No pain at the [facility] joint.  Biceps intact.  Speeds sign negative.  Had good strength with internal rotation a little bit of weakness with external rotation.  Good grip and release  Specialty Comments:  No specialty comments available.  Imaging: XR Shoulder Right  Result Date: 09/02/2021 Films of  the right shoulder tendon several projections.  Humeral head is centered about the glenoid.  Normal space between the humeral head and the acromion.  Acromion appears to be flat.  There are some degenerative changes at the [facility] joint with hypertrophy of the distal clavicle and some inferior prominence.  No ectopic calcification.  No acute changes.    PMFS History: Patient Active Problem List   Diagnosis Date Noted   Pain in right shoulder 09/02/2021   Senile purpura ([place]) 02/09/2021   Peripheral venous insufficiency 07/08/2020   Allergic rhinitis 02/22/2020   Allergic rhinitis due to pollen 02/22/2020   Cardiac arrhythmia 02/22/2020   Diverticulosis of colon 02/22/2020   Gout  02/22/2020   Hardening of the aorta (main artery of the heart) ([place]) 02/22/2020   History of colonic polyps 02/22/2020   Hypertensive renal disease 02/22/2020   Morbid obesity ([place]) 02/22/2020   Osteoarthrosis, unspecified whether generalized or localized, other specified sites 02/22/2020   Chronic kidney disease, stage 3 unspecified ([place]) 02/22/2020   Ventricular premature contractions 02/22/2020   Pain in right hip 02/07/2018   Bilateral primary osteoarthritis of knee 02/07/2018   Carpal tunnel syndrome of left wrist 12/28/2016   Cubital tunnel syndrome on left 12/28/2016   Trigger ring finger of right hand 12/14/2016   Chronic maxillary sinusitis 08/05/2016   Noise effect on both inner ears 08/05/2016   Presbycusis of both ears 08/05/2016   Dermatitis 10/23/2015   Primary osteoarthritis of right hip 11/26/2014   Severe obesity (BMI >= 40) ([place]) 11/26/2014   S/P total hip arthroplasty 11/26/2014   Acute cystitis without hematuria 11/03/2014   Varicose veins of lower extremities with other complications 40/98/1191   Elevated prostate specific antigen (PSA) 01/04/2011   Cancer of prostate ([place]) 11/22/2010   Past Medical History:  Diagnosis Date   Bruises easily    both hands   Cancer ([place]) 2005   prostate cancer treated with external beam radiation and radioactive seeds   Cataracts, bilateral    immature   Chronic kidney disease    Complication of anesthesia    states he was given too much anesthesia during his knee replacement   Constipation    takes Colace daily as needed   Decreased hearing    Diverticulitis 2003   DJD (degenerative joint disease)    thumbs and knees   History of colon polyps    benign   History of gout    Joint pain    Joint swelling    Leg cramps    no meds   Peripheral edema    takes Maxzide daily   Pneumonia    hx of-15+yrs ago   Ringing in ears    UTI (lower urinary tract infection)    just completed antibiotic on 11/14/14    Family  History  Problem Relation Age of Onset   Kidney disease Mother     Past Surgical History:  Procedure Laterality Date   APPENDECTOMY  1941   CATARACT EXTRACTION W/PHACO Right 10/05/2017   Procedure: CATARACT EXTRACTION PHACO AND INTRAOCULAR LENS PLACEMENT ([place]) RIGHT;  Surgeon: Leandrew Koyanagi, MD;  Location: [facility];  Service: Ophthalmology;  Laterality: Right;  requests arrival time to be after 10am   CATARACT EXTRACTION W/PHACO Left 10/26/2017   Procedure: CATARACT EXTRACTION PHACO AND INTRAOCULAR LENS PLACEMENT ([place]) LEFT;  Surgeon: Leandrew Koyanagi, MD;  Location: [employer] [facility];  Service: Ophthalmology;  Laterality: Left;   COLONOSCOPY     ENDOVENOUS ABLATION SAPHENOUS VEIN  W/ LASER  12-01-2011   left greater saphenous vein by Curt Jews MD    ENDOVENOUS ABLATION SAPHENOUS VEIN W/ LASER  12-22-2011   right greater saphenous vein    by Curt Jews MD   HAND SURGERY     HEMORRHOID SURGERY  1970   PARTIAL COLECTOMY     prostate seeds      removal of chest wall tumor     sigmoid colon resection     TOTAL HIP ARTHROPLASTY Right 11/26/2014   Procedure: TOTAL HIP ARTHROPLASTY;  Surgeon: Garald Balding, MD;  Location: [facility];  Service: Orthopedics;  Laterality: Right;   TOTAL KNEE ARTHROPLASTY  08-2010   Social History   Occupational History   Not on file  Tobacco Use   Smoking status: Former    Packs/day: 1.00    Years: 12.00    Total pack years: 12.00    Types: Cigarettes    Quit date: 07/21/1956    Years since quitting: 65.1   Smokeless tobacco: Never   Tobacco comments:    quick smoking 41yr ago  Vaping Use   Vaping Use: Never used  Substance and Sexual Activity   Alcohol use: No   Drug use: No   Sexual activity: Not Currently

## 2021-09-11 DIAGNOSIS — R399 Unspecified symptoms and signs involving the genitourinary system: Secondary | ICD-10-CM | POA: Diagnosis not present

## 2021-09-21 IMAGING — CR DG LUMBAR SPINE 2-3V
3 series · 3 of 3 positions shown · non-contrast
Comparison: None

CLINICAL DATA: Fell 1 day ago, LEFT hip and low back pain

EXAM:
LUMBAR SPINE - 2-3 VIEW

[t l-spine a.p.]
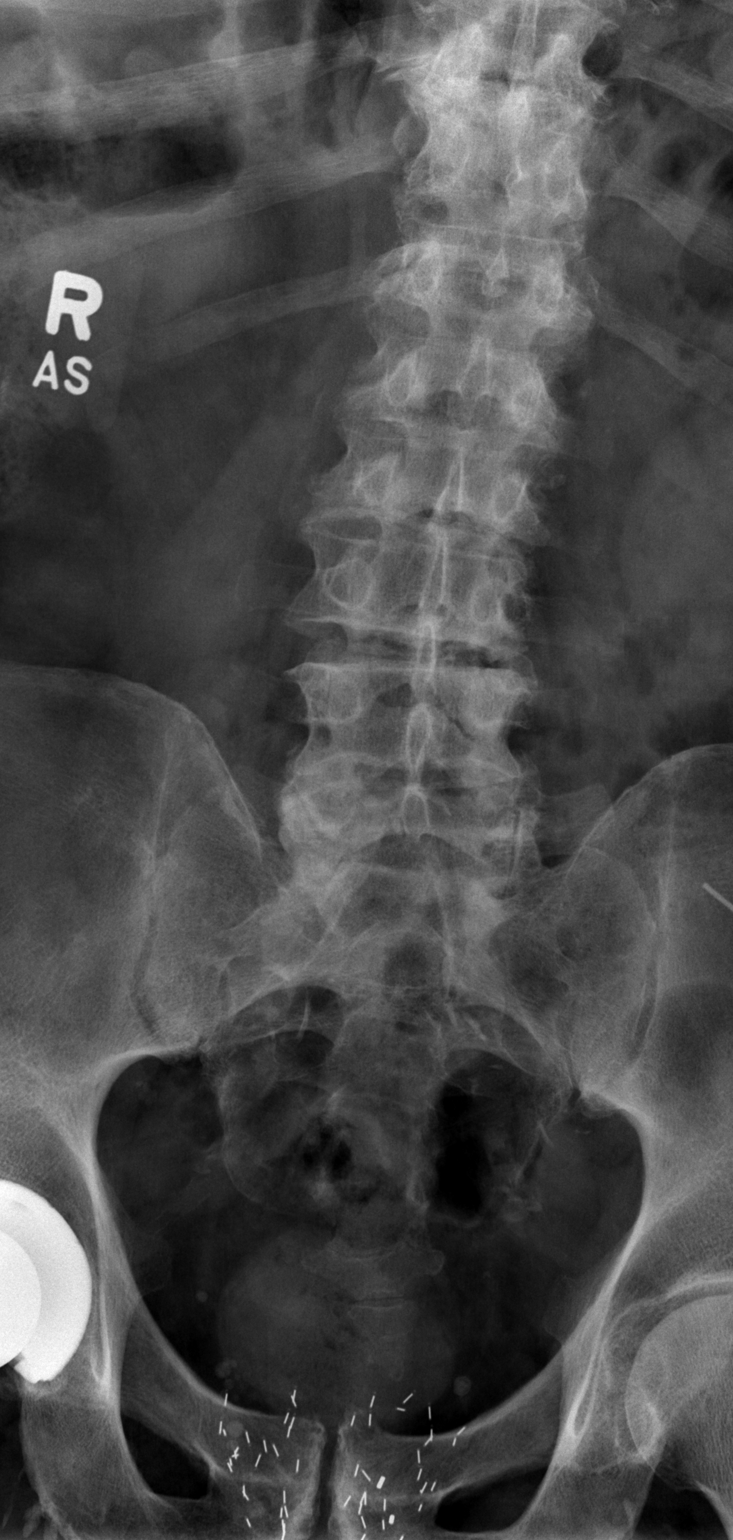

[t l-spine lat]
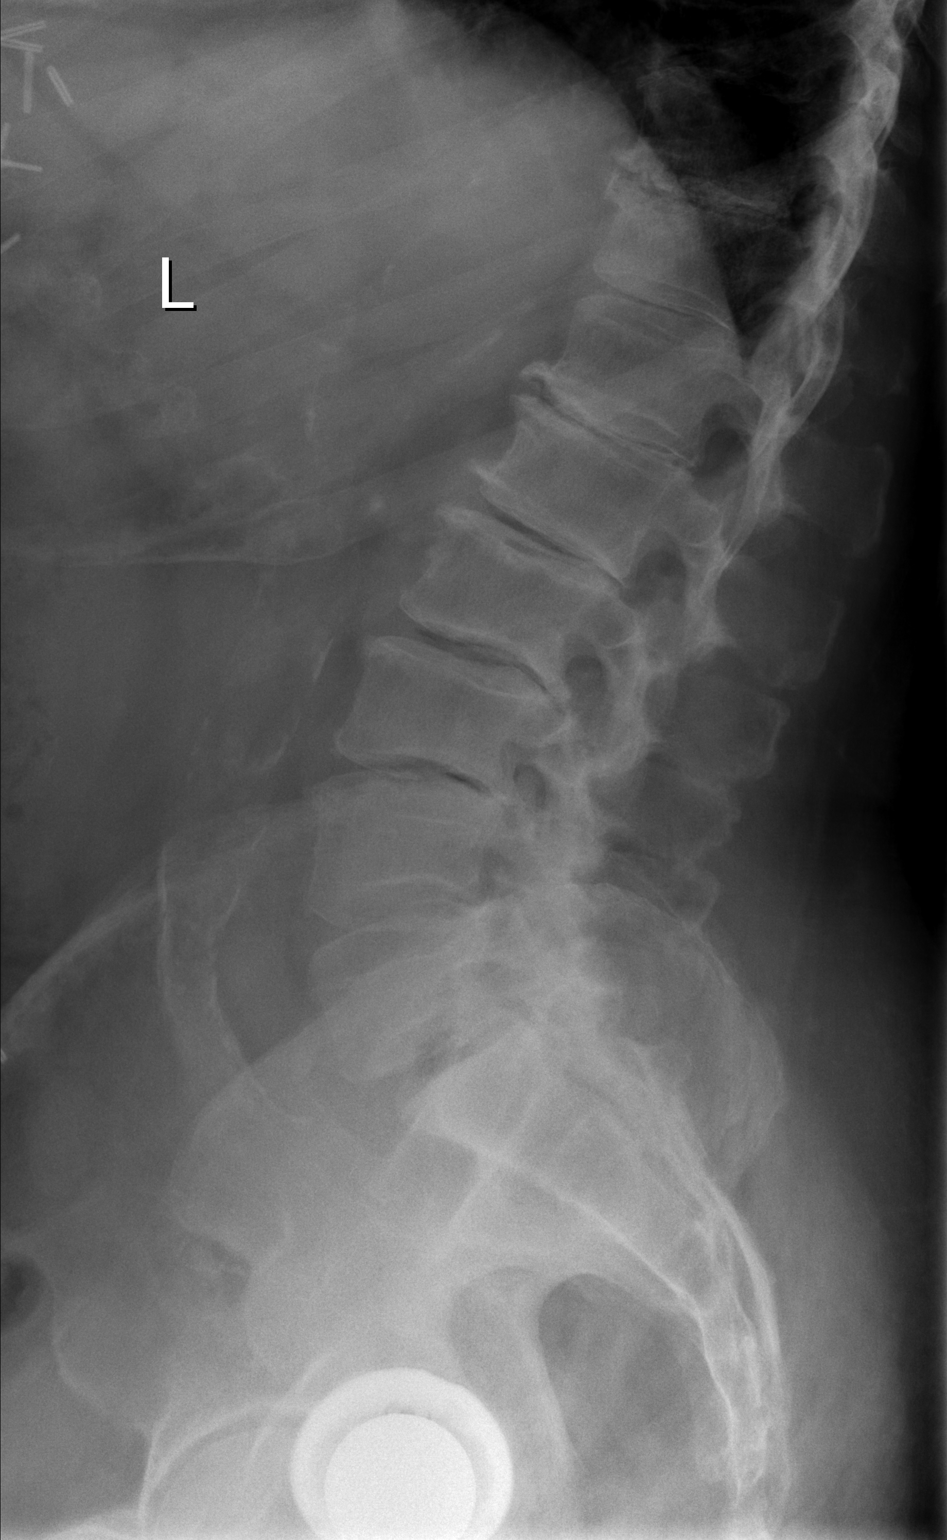

[t l-spine l5-s1 spot]
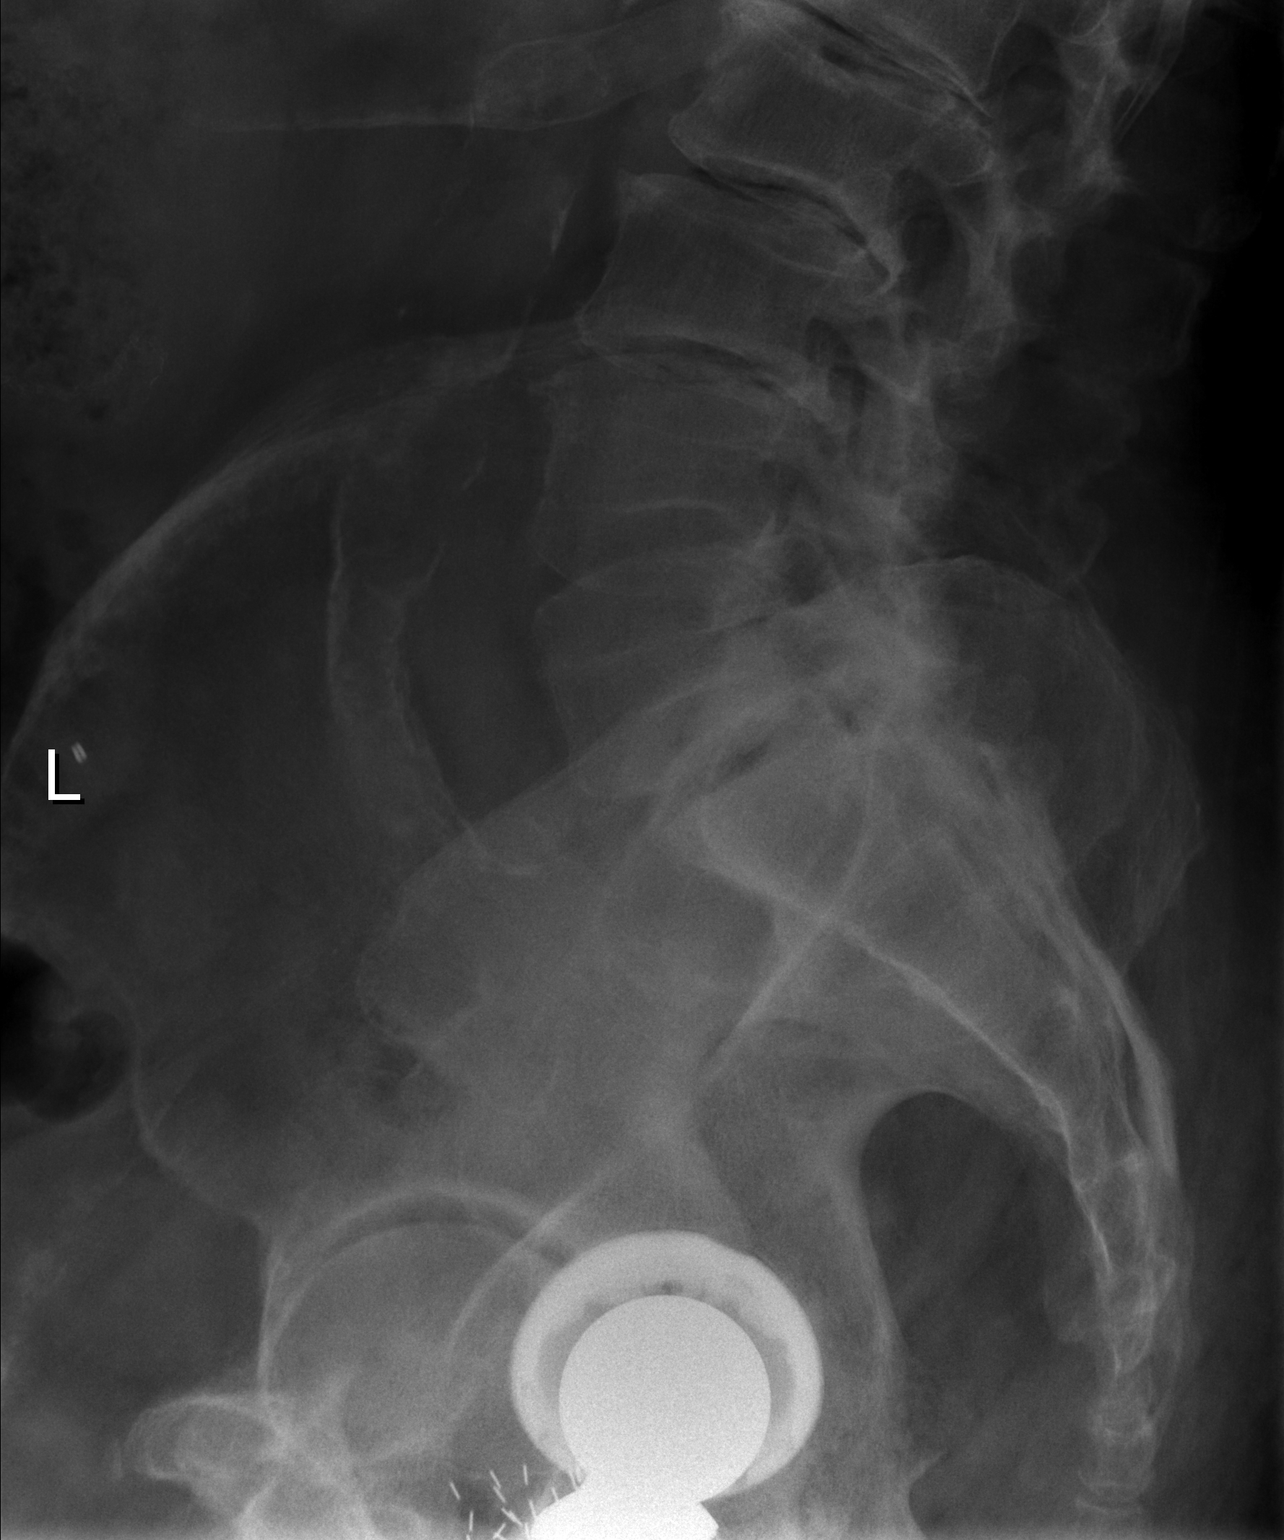

[3 of 3 positions shown; findings below may reference images not displayed]

FINDINGS: Osseous demineralization with minimal biconvex thoracolumbar
scoliosis.

Multilevel disc space narrowing and endplate spur formation lumbar
spine. New scattered vacuum phenomenon.

Facet degenerative changes lower lumbar spine.

Minimal anterolisthesis L5-S1 likely due to degenerative disc and
facet disease.

No acute fracture, additional subluxation, or bone destruction.

Noted RIGHT hip prosthesis and prostate seed implants.

Atherosclerotic calcifications aorta and iliac arteries.
IMPRESSION: Degenerative disc and facet disease changes of lumbar spine with
minimal scoliosis.

No acute osseous abnormalities.

Aortic Atherosclerosis ([id number]).

## 2021-10-02 DIAGNOSIS — R399 Unspecified symptoms and signs involving the genitourinary system: Secondary | ICD-10-CM | POA: Diagnosis not present

## 2021-11-25 ENCOUNTER — Ambulatory Visit (INDEPENDENT_AMBULATORY_CARE_PROVIDER_SITE_OTHER): Payer: Medicare Other | Admitting: Podiatry

## 2021-11-25 DIAGNOSIS — M79674 Pain in right toe(s): Secondary | ICD-10-CM | POA: Diagnosis not present

## 2021-11-25 DIAGNOSIS — B351 Tinea unguium: Secondary | ICD-10-CM

## 2021-11-25 DIAGNOSIS — C61 Malignant neoplasm of prostate: Secondary | ICD-10-CM | POA: Insufficient documentation

## 2021-11-25 DIAGNOSIS — M79675 Pain in left toe(s): Secondary | ICD-10-CM | POA: Diagnosis not present

## 2021-11-30 ENCOUNTER — Encounter: Payer: Self-pay | Admitting: Podiatry

## 2021-11-30 NOTE — Progress Notes (Signed)
  Subjective:  Patient ID: Christopher [name], male    DOB: [birth year],  MRN: [id number]  Fleet Higham presents to clinic today for   Chief Complaint  Patient presents with   Nail Problem    Routine foot care PCP-Jonathan Henderson PCP [facility]- has not met him not   New problem(s): None.   PCP is Stoneking, Christiane Ha, MD.  Allergies  Allergen Reactions   Chicken Allergy Anaphylaxis    Other reaction(s): Unknown   Other Anaphylaxis and Swelling    Chicken   Mirabegron     Other reaction(s): side pain   Penicillins Itching and Swelling   Poultry Meal Swelling    Review of Systems: Negative except as noted in the HPI.  Objective: No changes noted in today's physical examination.  Christopher [name] is a pleasant 88 y.o. male morbidly obese in NAD. AAO x 3.  Neurovascular Examination: Capillary refill time to digits immediate b/l lower extremities. Palpable DP pulse(s) b/l lower extremities Palpable PT pulse(s) b/l lower extremities Pedal hair absent. Lower extremity skin temperature gradient within normal limits. Dependent rubor noted left lower extremity. No pain with calf compression b/l. Evidence of chronic venous insufficiency b/l lower extremities.  Protective sensation intact 5/5 intact bilaterally with 10g monofilament b/l.  Dermatological:  Skin warm and supple b/l lower extremities. No open wounds b/l LE. No interdigital macerations b/l lower extremities. Toenails 1-5 b/l elongated, discolored, dystrophic, thickened, crumbly with subungual debris and tenderness to dorsal palpation.  Musculoskeletal:  Normal muscle strength 5/5 to all lower extremity muscle groups bilaterally. No gross bony deformities b/l lower extremities. Ambulates independently without assistive aids.  Assessment/Plan: 1. Pain due to onychomycosis of toenails of both feet     No orders of the defined types were placed in this encounter.   -Consent given for treatment as described below: -Continue compression hose  daily for edema control. -Continue supportive shoe gear daily. -Toenails 1-5 b/l were debrided in length and girth with sterile nail nippers and dremel without iatrogenic bleeding.  -Patient/POA to call should there be question/concern in the interim.   Return in about 9 weeks (around 01/27/2022).  Marzetta Board, DPM

## 2021-12-07 ENCOUNTER — Other Ambulatory Visit: Payer: Self-pay | Admitting: Internal Medicine

## 2021-12-07 ENCOUNTER — Ambulatory Visit
Admission: RE | Admit: 2021-12-07 | Discharge: 2021-12-07 | Disposition: A | Discharge status: Home / Self Care | Payer: Medicare Other | Source: Ambulatory Visit | Attending: Internal Medicine | Admitting: Internal Medicine

## 2021-12-07 DIAGNOSIS — M4126 Other idiopathic scoliosis, lumbar region: Secondary | ICD-10-CM | POA: Diagnosis not present

## 2021-12-07 DIAGNOSIS — M545 Low back pain, unspecified: Secondary | ICD-10-CM | POA: Diagnosis not present

## 2021-12-07 DIAGNOSIS — I872 Venous insufficiency (chronic) (peripheral): Secondary | ICD-10-CM | POA: Diagnosis not present

## 2021-12-07 DIAGNOSIS — M25511 Pain in right shoulder: Secondary | ICD-10-CM | POA: Diagnosis not present

## 2021-12-07 DIAGNOSIS — I10 Essential (primary) hypertension: Secondary | ICD-10-CM | POA: Diagnosis not present

## 2021-12-07 DIAGNOSIS — N1832 Chronic kidney disease, stage 3b: Secondary | ICD-10-CM | POA: Diagnosis not present

## 2021-12-07 DIAGNOSIS — M5431 Sciatica, right side: Secondary | ICD-10-CM

## 2021-12-07 DIAGNOSIS — D692 Other nonthrombocytopenic purpura: Secondary | ICD-10-CM | POA: Diagnosis not present

## 2021-12-09 DIAGNOSIS — H353131 Nonexudative age-related macular degeneration, bilateral, early dry stage: Secondary | ICD-10-CM | POA: Diagnosis not present

## 2021-12-14 DIAGNOSIS — M180 Bilateral primary osteoarthritis of first carpometacarpal joints: Secondary | ICD-10-CM | POA: Diagnosis not present

## 2021-12-14 DIAGNOSIS — M25511 Pain in right shoulder: Secondary | ICD-10-CM | POA: Diagnosis not present

## 2021-12-14 DIAGNOSIS — Z6837 Body mass index (BMI) 37.0-37.9, adult: Secondary | ICD-10-CM | POA: Diagnosis not present

## 2021-12-14 DIAGNOSIS — N1832 Chronic kidney disease, stage 3b: Secondary | ICD-10-CM | POA: Diagnosis not present

## 2021-12-14 DIAGNOSIS — Z87891 Personal history of nicotine dependence: Secondary | ICD-10-CM | POA: Diagnosis not present

## 2021-12-14 DIAGNOSIS — G8929 Other chronic pain: Secondary | ICD-10-CM | POA: Diagnosis not present

## 2021-12-14 DIAGNOSIS — I700 Atherosclerosis of aorta: Secondary | ICD-10-CM | POA: Diagnosis not present

## 2021-12-14 DIAGNOSIS — I872 Venous insufficiency (chronic) (peripheral): Secondary | ICD-10-CM | POA: Diagnosis not present

## 2021-12-14 DIAGNOSIS — N400 Enlarged prostate without lower urinary tract symptoms: Secondary | ICD-10-CM | POA: Diagnosis not present

## 2021-12-14 DIAGNOSIS — M5431 Sciatica, right side: Secondary | ICD-10-CM | POA: Diagnosis not present

## 2021-12-14 DIAGNOSIS — Z8616 Personal history of COVID-19: Secondary | ICD-10-CM | POA: Diagnosis not present

## 2021-12-14 DIAGNOSIS — Z8546 Personal history of malignant neoplasm of prostate: Secondary | ICD-10-CM | POA: Diagnosis not present

## 2021-12-14 DIAGNOSIS — D692 Other nonthrombocytopenic purpura: Secondary | ICD-10-CM | POA: Diagnosis not present

## 2021-12-14 DIAGNOSIS — M170 Bilateral primary osteoarthritis of knee: Secondary | ICD-10-CM | POA: Diagnosis not present

## 2021-12-14 DIAGNOSIS — I129 Hypertensive chronic kidney disease with stage 1 through stage 4 chronic kidney disease, or unspecified chronic kidney disease: Secondary | ICD-10-CM | POA: Diagnosis not present

## 2021-12-21 DIAGNOSIS — M5431 Sciatica, right side: Secondary | ICD-10-CM | POA: Diagnosis not present

## 2021-12-21 DIAGNOSIS — I872 Venous insufficiency (chronic) (peripheral): Secondary | ICD-10-CM | POA: Diagnosis not present

## 2021-12-21 DIAGNOSIS — G8929 Other chronic pain: Secondary | ICD-10-CM | POA: Diagnosis not present

## 2021-12-21 DIAGNOSIS — I129 Hypertensive chronic kidney disease with stage 1 through stage 4 chronic kidney disease, or unspecified chronic kidney disease: Secondary | ICD-10-CM | POA: Diagnosis not present

## 2021-12-21 DIAGNOSIS — M25511 Pain in right shoulder: Secondary | ICD-10-CM | POA: Diagnosis not present

## 2021-12-21 DIAGNOSIS — N1832 Chronic kidney disease, stage 3b: Secondary | ICD-10-CM | POA: Diagnosis not present

## 2021-12-31 DIAGNOSIS — I872 Venous insufficiency (chronic) (peripheral): Secondary | ICD-10-CM | POA: Diagnosis not present

## 2021-12-31 DIAGNOSIS — N1832 Chronic kidney disease, stage 3b: Secondary | ICD-10-CM | POA: Diagnosis not present

## 2021-12-31 DIAGNOSIS — G8929 Other chronic pain: Secondary | ICD-10-CM | POA: Diagnosis not present

## 2021-12-31 DIAGNOSIS — I129 Hypertensive chronic kidney disease with stage 1 through stage 4 chronic kidney disease, or unspecified chronic kidney disease: Secondary | ICD-10-CM | POA: Diagnosis not present

## 2021-12-31 DIAGNOSIS — M5431 Sciatica, right side: Secondary | ICD-10-CM | POA: Diagnosis not present

## 2021-12-31 DIAGNOSIS — M25511 Pain in right shoulder: Secondary | ICD-10-CM | POA: Diagnosis not present

## 2022-01-05 DIAGNOSIS — I129 Hypertensive chronic kidney disease with stage 1 through stage 4 chronic kidney disease, or unspecified chronic kidney disease: Secondary | ICD-10-CM | POA: Diagnosis not present

## 2022-01-05 DIAGNOSIS — M5431 Sciatica, right side: Secondary | ICD-10-CM | POA: Diagnosis not present

## 2022-01-05 DIAGNOSIS — I872 Venous insufficiency (chronic) (peripheral): Secondary | ICD-10-CM | POA: Diagnosis not present

## 2022-01-05 DIAGNOSIS — N1832 Chronic kidney disease, stage 3b: Secondary | ICD-10-CM | POA: Diagnosis not present

## 2022-01-05 DIAGNOSIS — G8929 Other chronic pain: Secondary | ICD-10-CM | POA: Diagnosis not present

## 2022-01-05 DIAGNOSIS — M25511 Pain in right shoulder: Secondary | ICD-10-CM | POA: Diagnosis not present

## 2022-01-07 DIAGNOSIS — M109 Gout, unspecified: Secondary | ICD-10-CM | POA: Diagnosis not present

## 2022-01-13 DIAGNOSIS — I872 Venous insufficiency (chronic) (peripheral): Secondary | ICD-10-CM | POA: Diagnosis not present

## 2022-01-13 DIAGNOSIS — M5431 Sciatica, right side: Secondary | ICD-10-CM | POA: Diagnosis not present

## 2022-01-13 DIAGNOSIS — I129 Hypertensive chronic kidney disease with stage 1 through stage 4 chronic kidney disease, or unspecified chronic kidney disease: Secondary | ICD-10-CM | POA: Diagnosis not present

## 2022-01-13 DIAGNOSIS — Z8546 Personal history of malignant neoplasm of prostate: Secondary | ICD-10-CM | POA: Diagnosis not present

## 2022-01-13 DIAGNOSIS — M170 Bilateral primary osteoarthritis of knee: Secondary | ICD-10-CM | POA: Diagnosis not present

## 2022-01-13 DIAGNOSIS — Z8616 Personal history of COVID-19: Secondary | ICD-10-CM | POA: Diagnosis not present

## 2022-01-13 DIAGNOSIS — M180 Bilateral primary osteoarthritis of first carpometacarpal joints: Secondary | ICD-10-CM | POA: Diagnosis not present

## 2022-01-13 DIAGNOSIS — N400 Enlarged prostate without lower urinary tract symptoms: Secondary | ICD-10-CM | POA: Diagnosis not present

## 2022-01-13 DIAGNOSIS — N1832 Chronic kidney disease, stage 3b: Secondary | ICD-10-CM | POA: Diagnosis not present

## 2022-01-13 DIAGNOSIS — Z6837 Body mass index (BMI) 37.0-37.9, adult: Secondary | ICD-10-CM | POA: Diagnosis not present

## 2022-01-13 DIAGNOSIS — Z87891 Personal history of nicotine dependence: Secondary | ICD-10-CM | POA: Diagnosis not present

## 2022-01-13 DIAGNOSIS — I700 Atherosclerosis of aorta: Secondary | ICD-10-CM | POA: Diagnosis not present

## 2022-01-13 DIAGNOSIS — D692 Other nonthrombocytopenic purpura: Secondary | ICD-10-CM | POA: Diagnosis not present

## 2022-01-13 DIAGNOSIS — M25511 Pain in right shoulder: Secondary | ICD-10-CM | POA: Diagnosis not present

## 2022-01-13 DIAGNOSIS — G8929 Other chronic pain: Secondary | ICD-10-CM | POA: Diagnosis not present

## 2022-01-15 DIAGNOSIS — M25511 Pain in right shoulder: Secondary | ICD-10-CM | POA: Diagnosis not present

## 2022-01-15 DIAGNOSIS — N1832 Chronic kidney disease, stage 3b: Secondary | ICD-10-CM | POA: Diagnosis not present

## 2022-01-15 DIAGNOSIS — I129 Hypertensive chronic kidney disease with stage 1 through stage 4 chronic kidney disease, or unspecified chronic kidney disease: Secondary | ICD-10-CM | POA: Diagnosis not present

## 2022-01-15 DIAGNOSIS — M5431 Sciatica, right side: Secondary | ICD-10-CM | POA: Diagnosis not present

## 2022-01-15 DIAGNOSIS — I872 Venous insufficiency (chronic) (peripheral): Secondary | ICD-10-CM | POA: Diagnosis not present

## 2022-01-15 DIAGNOSIS — G8929 Other chronic pain: Secondary | ICD-10-CM | POA: Diagnosis not present

## 2022-01-29 ENCOUNTER — Ambulatory Visit (INDEPENDENT_AMBULATORY_CARE_PROVIDER_SITE_OTHER): Payer: Medicare Other | Admitting: Podiatry

## 2022-01-29 ENCOUNTER — Encounter: Payer: Self-pay | Admitting: Podiatry

## 2022-01-29 VITALS — BP 119/68

## 2022-01-29 DIAGNOSIS — I129 Hypertensive chronic kidney disease with stage 1 through stage 4 chronic kidney disease, or unspecified chronic kidney disease: Secondary | ICD-10-CM | POA: Diagnosis not present

## 2022-01-29 DIAGNOSIS — B351 Tinea unguium: Secondary | ICD-10-CM | POA: Diagnosis not present

## 2022-01-29 DIAGNOSIS — G8929 Other chronic pain: Secondary | ICD-10-CM | POA: Diagnosis not present

## 2022-01-29 DIAGNOSIS — M79674 Pain in right toe(s): Secondary | ICD-10-CM | POA: Diagnosis not present

## 2022-01-29 DIAGNOSIS — N1832 Chronic kidney disease, stage 3b: Secondary | ICD-10-CM | POA: Diagnosis not present

## 2022-01-29 DIAGNOSIS — I872 Venous insufficiency (chronic) (peripheral): Secondary | ICD-10-CM | POA: Diagnosis not present

## 2022-01-29 DIAGNOSIS — M79675 Pain in left toe(s): Secondary | ICD-10-CM

## 2022-01-29 DIAGNOSIS — M5431 Sciatica, right side: Secondary | ICD-10-CM | POA: Diagnosis not present

## 2022-01-29 DIAGNOSIS — M25511 Pain in right shoulder: Secondary | ICD-10-CM | POA: Diagnosis not present

## 2022-01-29 NOTE — Progress Notes (Signed)
  Subjective:  Patient ID: Christopher [name], male    DOB: 1928/08/23,  MRN: [id number]  Christopher [name] presents to clinic today for painful thick toenails that are difficult to trim. Pain interferes with ambulation. Aggravating factors include wearing enclosed shoe gear. Pain is relieved with periodic professional debridement.  Chief Complaint  Patient presents with   Nail Problem    [facility] PCP-[name], Christopher PCP [facility]-5 Weeks ago   New problem(s): None.   His sister is present during today's visit. She recently lost her husband.   PCP is Christopher Frames, MD.  Allergies  Allergen Reactions   Chicken Allergy Anaphylaxis    Other reaction(s): Unknown   Other Anaphylaxis and Swelling    Chicken   Mirabegron     Other reaction(s): side pain   Penicillins Itching and Swelling   Poultry Meal Swelling    Review of Systems: Negative except as noted in the HPI.  Objective:  Vitals:   01/29/22 1119  BP: 119/68   Christopher [name] is a pleasant 89 y.o. male morbidly obese in NAD. AAO x 3.  Neurovascular Examination: Capillary refill time to digits immediate b/l lower extremities. Palpable DP pulse(s) b/l lower extremities Palpable PT pulse(s) b/l lower extremities Pedal hair absent. Lower extremity skin temperature gradient within normal limits. Dependent rubor noted left lower extremity. No pain with calf compression b/l. Evidence of chronic venous insufficiency b/l lower extremities.  Protective sensation intact 5/5 intact bilaterally with 10g monofilament b/l.  Dermatological:  Skin warm and supple b/l lower extremities. No open wounds b/l LE. No interdigital macerations b/l lower extremities. Toenails 1-5 b/l elongated, discolored, dystrophic, thickened, crumbly with subungual debris and tenderness to dorsal palpation.  Incurvated nailplate lateral border of R 2nd toe with tenderness to palpation. No erythema, no edema, no drainage noted. No fluctuance.   Musculoskeletal:   Normal muscle strength 5/5 to all lower extremity muscle groups bilaterally. No gross bony deformities b/l lower extremities. Ambulates independently without assistive aids.  Assessment/Plan: 1. Pain due to onychomycosis of toenails of both feet     -Patient's family member present. All questions/concerns addressed on today's visit. -Examined patient. -Toenails 1-5 left foot, 3-5 right foot, and right great toe debrided in length and girth without iatrogenic bleeding with sterile nail nipper and dremel.  -No invasive procedure(s) performed. Offending nail border debrided and curretaged lateral border of right second digit utilizing sterile nail nipper and currette. Border(s) cleansed with alcohol and triple antibiotic ointment applied. Patient/POA/Caregiver/Facility instructed to apply Neosporin Cream  to R 2nd toe once daily for 7 days. Call office if there are any concerns. -Patient/POA to call should there be question/concern in the interim.   Return in about 9 weeks (around 04/02/2022).  Marzetta Board, DPM

## 2022-02-02 ENCOUNTER — Encounter: Payer: Self-pay | Admitting: Physician Assistant

## 2022-02-02 ENCOUNTER — Ambulatory Visit (INDEPENDENT_AMBULATORY_CARE_PROVIDER_SITE_OTHER): Payer: Medicare Other | Admitting: Physician Assistant

## 2022-02-02 ENCOUNTER — Ambulatory Visit (INDEPENDENT_AMBULATORY_CARE_PROVIDER_SITE_OTHER): Payer: Medicare Other

## 2022-02-02 DIAGNOSIS — M25512 Pain in left shoulder: Secondary | ICD-10-CM

## 2022-02-02 DIAGNOSIS — G8929 Other chronic pain: Secondary | ICD-10-CM

## 2022-02-02 MED ORDER — METHYLPREDNISOLONE ACETATE 40 MG/ML IJ SUSP
80.0000 mg | INTRAMUSCULAR | Status: AC | PRN
  Administered 2022-02-02: 80 mg via INTRA_ARTICULAR

## 2022-02-02 MED ORDER — LIDOCAINE HCL 1 % IJ SOLN
2.0000 mL | INTRAMUSCULAR | Status: AC | PRN
  Administered 2022-02-02: 2 mL

## 2022-02-02 MED ORDER — BUPIVACAINE HCL 0.25 % IJ SOLN
2.0000 mL | INTRAMUSCULAR | Status: AC | PRN
  Administered 2022-02-02: 2 mL via INTRA_ARTICULAR

## 2022-02-02 NOTE — Progress Notes (Signed)
Office Visit Note   Patient: Christopher [name]           Date of Birth: 05/22/1928           MRN: [id number] Visit Date: 02/02/2022              Requested by: Kathalene Frames, MD [address] [place] [address] [place],  [place] [address] PCP: Kathalene Frames, MD  Chief Complaint  Patient presents with  . Left Shoulder - Pain      HPI: Patient is a pleasant 89 year old gentleman who is a patient of Dr. Durward Fortes.  He has seen Dr. Durward Fortes in the past for injections into his right shoulder subacromial and done quite well.  He presents today with new onset of left shoulder pain.  Denies any injuries.  Just said he slept on it wrong.  Denies any paresthesias or neck pain.  Denies any fever chills or other illness  Assessment & Plan: Visit Diagnoses:  1. Chronic left shoulder pain     Plan: X-ray does show quite a bit of degenerative changes in the [facility] joint.  Also some calcification.  This seems to be the area that he is tender.  Did go forward with an injection today seem to help him quite a bit.  Will follow-up in a week.  Asked that he do some pendulum exercises to prevent his shoulder from getting stiff  Follow-Up Instructions: Return in about 1 week (around 02/09/2022).   Ortho Exam  Patient is alert, oriented, no adenopathy, well-dressed, normal affect, normal respiratory effort. No tenderness in the neck he is focally tender over the [facility] joint there is no redness no effusion.  He has impingement findings with forward elevation in the same area.  He can internally rotate behind his back to his back pocket.  Neuro logically he is intact.  No redness no deformity no ecchymosis  Imaging: XR Shoulder Left  Result Date: 02/02/2022 Radiographs of his left shoulder demonstrate some early degenerative changes he does have quite a bit of calcification and arthrosis within the [facility] joint.  No acute fractures are noted.  No images are attached to the encounter.  Labs: Lab  Results  Component Value Date   REPTSTATUS 11/16/2014 FINAL 11/15/2014   CULT 2,000 COLONIES/mL INSIGNIFICANT GROWTH 11/15/2014     Lab Results  Component Value Date   ALBUMIN 3.6 11/15/2014   ALBUMIN 2.6 (L) 08/26/2010   ALBUMIN 3.0 (L) 08/25/2010    No results found for: "MG" No results found for: "VD25OH"  No results found for: "PREALBUMIN"    Latest Ref Rng & Units 12/03/2014   12:00 AM 11/29/2014    4:25 AM 11/28/2014    6:25 AM  CBC EXTENDED  WBC 10^3/mL 8.9     9.7  9.7   RBC 4.22 - 5.81 MIL/uL  3.96  3.93   Hemoglobin 13.5 - 17.5 g/dL 11.5     11.6  11.4   HCT 41 - 53 % 36     35.5  34.9   Platelets 150 - 399 K/L 383     188  183      This result is from an external source.     There is no height or weight on file to calculate BMI.  Orders:  Orders Placed This Encounter  Procedures  . XR Shoulder Left   No orders of the defined types were placed in this encounter.    Procedures: Large Joint Inj: L subacromial  bursa on 02/02/2022 11:30 AM Indications: diagnostic evaluation and pain Details: 25 G 1.5 in needle, posterior approach  Arthrogram: No  Medications: 2 mL lidocaine 1 %; 80 mg methylPREDNISolone acetate 40 MG/ML; 2 mL bupivacaine 0.25 % Outcome: tolerated well, no immediate complications Procedure, treatment alternatives, risks and benefits explained, specific risks discussed. Consent was given by the patient.    Clinical Data: No additional findings.  ROS:  All other systems negative, except as noted in the HPI. Review of Systems  Objective: Vital Signs: There were no vitals taken for this visit.  Specialty Comments:  No specialty comments available.  PMFS History: Patient Active Problem List   Diagnosis Date Noted  . Pain in left shoulder 02/02/2022  . Prostate cancer ([place]) 11/25/2021  . Pain in right shoulder 09/02/2021  . Senile purpura ([place]) 02/09/2021  . Peripheral venous insufficiency 07/08/2020  . Allergic rhinitis  02/22/2020  . Allergic rhinitis due to pollen 02/22/2020  . Cardiac arrhythmia 02/22/2020  . Diverticulosis of colon 02/22/2020  . Gout 02/22/2020  . Hardening of the aorta (main artery of the heart) ([place]) 02/22/2020  . History of colonic polyps 02/22/2020  . Hypertensive renal disease 02/22/2020  . Morbid obesity ([place]) 02/22/2020  . Osteoarthrosis, unspecified whether generalized or localized, other specified sites 02/22/2020  . Chronic kidney disease, stage 3 unspecified ([place]) 02/22/2020  . Ventricular premature contractions 02/22/2020  . Pain in right hip 02/07/2018  . Bilateral primary osteoarthritis of knee 02/07/2018  . Carpal tunnel syndrome of left wrist 12/28/2016  . Cubital tunnel syndrome on left 12/28/2016  . Trigger ring finger of right hand 12/14/2016  . Chronic maxillary sinusitis 08/05/2016  . Noise effect on both inner ears 08/05/2016  . Presbycusis of both ears 08/05/2016  . Dermatitis 10/23/2015  . Primary osteoarthritis of right hip 11/26/2014  . Severe obesity (BMI >= 40) ([place]) 11/26/2014  . S/P total hip arthroplasty 11/26/2014  . Acute cystitis without hematuria 11/03/2014  . Varicose veins of lower extremities with other complications 37/16/9678  . Elevated prostate specific antigen (PSA) 01/04/2011  . Cancer of prostate ([place]) 11/22/2010   Past Medical History:  Diagnosis Date  . Bruises easily    both hands  . Cancer [facility]) 2005   prostate cancer treated with external beam radiation and radioactive seeds  . Cataracts, bilateral    immature  . Chronic kidney disease   . Complication of anesthesia    states he was given too much anesthesia during his knee replacement  . Constipation    takes Colace daily as needed  . Decreased hearing   . Diverticulitis 2003  . DJD (degenerative joint disease)    thumbs and knees  . History of colon polyps    benign  . History of gout   . Joint pain   . Joint swelling   . Leg cramps    no meds  . Peripheral  edema    takes Maxzide daily  . Pneumonia    hx of-15+yrs ago  . Ringing in ears   . UTI (lower urinary tract infection)    just completed antibiotic on 11/14/14    Family History  Problem Relation Age of Onset  . Kidney disease Mother     Past Surgical History:  Procedure Laterality Date  . APPENDECTOMY  1941  . CATARACT EXTRACTION W/PHACO Right 10/05/2017   Procedure: CATARACT EXTRACTION PHACO AND INTRAOCULAR LENS PLACEMENT ([place]) RIGHT;  Surgeon: Leandrew Koyanagi, MD;  Location: [place];  Service:  Ophthalmology;  Laterality: Right;  requests arrival time to be after 10am  . CATARACT EXTRACTION W/PHACO Left 10/26/2017   Procedure: CATARACT EXTRACTION PHACO AND INTRAOCULAR LENS PLACEMENT ([place]) LEFT;  Surgeon: Leandrew Koyanagi, MD;  Location: [place];  Service: Ophthalmology;  Laterality: Left;  . COLONOSCOPY    . ENDOVENOUS ABLATION SAPHENOUS VEIN W/ LASER  12-01-2011   left greater saphenous vein by Curt Jews MD   . ENDOVENOUS ABLATION SAPHENOUS VEIN W/ LASER  12-22-2011   right greater saphenous vein    by Curt Jews MD  . HAND SURGERY    . [place]  . PARTIAL COLECTOMY    . prostate seeds     . removal of chest wall tumor    . sigmoid colon resection    . TOTAL HIP ARTHROPLASTY Right 11/26/2014   Procedure: TOTAL HIP ARTHROPLASTY;  Surgeon: Garald Balding, MD;  Location: [place];  Service: Orthopedics;  Laterality: Right;  . TOTAL KNEE ARTHROPLASTY  08-2010   Social History   Occupational History  . Not on file  Tobacco Use  . Smoking status: Former    Packs/day: 1.00    Years: 12.00    Total pack years: 12.00    Types: Cigarettes    Quit date: 07/21/1956    Years since quitting: 65.5  . Smokeless tobacco: Never  . Tobacco comments:    quick smoking 37yr ago  Vaping Use  . Vaping Use: Never used  Substance and Sexual Activity  . Alcohol use: No  . Drug use: No  . Sexual activity: Not Currently

## 2022-02-09 ENCOUNTER — Ambulatory Visit (INDEPENDENT_AMBULATORY_CARE_PROVIDER_SITE_OTHER): Payer: Medicare Other | Admitting: Physician Assistant

## 2022-02-09 ENCOUNTER — Encounter: Payer: Self-pay | Admitting: Physician Assistant

## 2022-02-09 DIAGNOSIS — G8929 Other chronic pain: Secondary | ICD-10-CM

## 2022-02-09 DIAGNOSIS — M25512 Pain in left shoulder: Secondary | ICD-10-CM | POA: Diagnosis not present

## 2022-02-09 NOTE — Progress Notes (Signed)
Office Visit Note   Patient: Christopher [name]           Date of Birth: Jan 13, 1928           MRN: [id number] Visit Date: 02/09/2022              Requested by: Kathalene Frames, MD [address], [place],  [place] [address] PCP: Kathalene Frames, MD  Chief Complaint  Patient presents with   Left Shoulder - Follow-up      HPI: Christopher [name] presents in follow-up today for his left shoulder.  He presented a week ago with left shoulder pain after sleeping on it wrong.  Exam consistent with some impingement and rotator cuff tendinitis.  I did give him a subacromial injection.  He reports he is doing much better.  Assessment & Plan: Visit Diagnoses: Left shoulder pain  Plan: Patient may follow-up as needed he understands that he does have some arthritis in his shoulder that may cause some problems now and again.  Also encouraged him to keep the shoulder moving so it does not get stiff  Follow-Up Instructions: Return if symptoms worsen or fail to improve.   Ortho Exam  Patient is alert, oriented, no adenopathy, well-dressed, normal affect, normal respiratory effort. Left shoulder he has full forward elevation internal rotation behind the back.  He is neurovascular intact strength is intact.  Negative impingement findings today  Imaging: No results found. No images are attached to the encounter.  Labs: Lab Results  Component Value Date   REPTSTATUS 11/16/2014 FINAL 11/15/2014   CULT 2,000 COLONIES/mL INSIGNIFICANT GROWTH 11/15/2014     Lab Results  Component Value Date   ALBUMIN 3.6 11/15/2014   ALBUMIN 2.6 (L) 08/26/2010   ALBUMIN 3.0 (L) 08/25/2010    No results found for: "MG" No results found for: "VD25OH"  No results found for: "PREALBUMIN"    Latest Ref Rng & Units 12/03/2014   12:00 AM 11/29/2014    4:25 AM 11/28/2014    6:25 AM  CBC EXTENDED  WBC 10^3/mL 8.9     9.7  9.7   RBC 4.22 - 5.81 MIL/uL  3.96  3.93   Hemoglobin 13.5 - 17.5 g/dL  11.5     11.6  11.4   HCT 41 - 53 % 36     35.5  34.9   Platelets 150 - 399 K/L 383     188  183      This result is from an external source.     There is no height or weight on file to calculate BMI.  Orders:  No orders of the defined types were placed in this encounter.  No orders of the defined types were placed in this encounter.    Procedures: No procedures performed  Clinical Data: No additional findings.  ROS:  All other systems negative, except as noted in the HPI. Review of Systems  Objective: Vital Signs: There were no vitals taken for this visit.  Specialty Comments:  No specialty comments available.  PMFS History: Patient Active Problem List   Diagnosis Date Noted   Pain in left shoulder 02/02/2022   Prostate cancer ([place]) 11/25/2021   Pain in right shoulder 09/02/2021   Senile purpura ([place]) 02/09/2021   Peripheral venous insufficiency 07/08/2020   Allergic rhinitis 02/22/2020   Allergic rhinitis due to pollen 02/22/2020   Cardiac arrhythmia 02/22/2020   Diverticulosis of colon 02/22/2020   Gout 02/22/2020   Hardening of the aorta (  main artery of the heart) ([place]) 02/22/2020   History of colonic polyps 02/22/2020   Hypertensive renal disease 02/22/2020   Morbid obesity ([place]) 02/22/2020   Osteoarthrosis, unspecified whether generalized or localized, other specified sites 02/22/2020   Chronic kidney disease, stage 3 unspecified ([place]) 02/22/2020   Ventricular premature contractions 02/22/2020   Pain in right hip 02/07/2018   Bilateral primary osteoarthritis of knee 02/07/2018   Carpal tunnel syndrome of left wrist 12/28/2016   Cubital tunnel syndrome on left 12/28/2016   Trigger ring finger of right hand 12/14/2016   Chronic maxillary sinusitis 08/05/2016   Noise effect on both inner ears 08/05/2016   Presbycusis of both ears 08/05/2016   Dermatitis 10/23/2015   Primary osteoarthritis of right hip 11/26/2014   Severe obesity (BMI >= 40) ([place])  11/26/2014   S/P total hip arthroplasty 11/26/2014   Acute cystitis without hematuria 11/03/2014   Varicose veins of lower extremities with other complications 96/78/9381   Elevated prostate specific antigen (PSA) 01/04/2011   Cancer of prostate ([place]) 11/22/2010   Past Medical History:  Diagnosis Date   Bruises easily    both hands   Cancer ([place]) 2005   prostate cancer treated with external beam radiation and radioactive seeds   Cataracts, bilateral    immature   Chronic kidney disease    Complication of anesthesia    states he was given too much anesthesia during his knee replacement   Constipation    takes Colace daily as needed   Decreased hearing    Diverticulitis 2003   DJD (degenerative joint disease)    thumbs and knees   History of colon polyps    benign   History of gout    Joint pain    Joint swelling    Leg cramps    no meds   Peripheral edema    takes Maxzide daily   Pneumonia    hx of-15+yrs ago   Ringing in ears    UTI (lower urinary tract infection)    just completed antibiotic on 11/14/14    Family History  Problem Relation Age of Onset   Kidney disease Mother     Past Surgical History:  Procedure Laterality Date   APPENDECTOMY  1941   CATARACT EXTRACTION W/PHACO Right 10/05/2017   Procedure: CATARACT EXTRACTION PHACO AND INTRAOCULAR LENS PLACEMENT ([place]) RIGHT;  Surgeon: Leandrew Koyanagi, MD;  Location: [place];  Service: Ophthalmology;  Laterality: Right;  requests arrival time to be after 10am   CATARACT EXTRACTION W/PHACO Left 10/26/2017   Procedure: CATARACT EXTRACTION PHACO AND INTRAOCULAR LENS PLACEMENT ([place]) LEFT;  Surgeon: Leandrew Koyanagi, MD;  Location: [place];  Service: Ophthalmology;  Laterality: Left;   COLONOSCOPY     ENDOVENOUS ABLATION SAPHENOUS VEIN W/ LASER  12-01-2011   left greater saphenous vein by Curt Jews MD    ENDOVENOUS ABLATION SAPHENOUS VEIN W/ LASER  12-22-2011   right greater saphenous  vein    by Curt Jews MD   HAND SURGERY     HEMORRHOID SURGERY  1970   PARTIAL COLECTOMY     prostate seeds      removal of chest wall tumor     sigmoid colon resection     TOTAL HIP ARTHROPLASTY Right 11/26/2014   Procedure: TOTAL HIP ARTHROPLASTY;  Surgeon: Garald Balding, MD;  Location: [place];  Service: Orthopedics;  Laterality: Right;   TOTAL KNEE ARTHROPLASTY  08-2010   Social History   Occupational History  Not on file  Tobacco Use   Smoking status: Former    Packs/day: 1.00    Years: 12.00    Total pack years: 12.00    Types: Cigarettes    Quit date: 07/21/1956    Years since quitting: 65.6   Smokeless tobacco: Never   Tobacco comments:    quick smoking 32yr ago  Vaping Use   Vaping Use: Never used  Substance and Sexual Activity   Alcohol use: No   Drug use: No   Sexual activity: Not Currently

## 2022-02-10 DIAGNOSIS — I872 Venous insufficiency (chronic) (peripheral): Secondary | ICD-10-CM | POA: Diagnosis not present

## 2022-02-10 DIAGNOSIS — M25511 Pain in right shoulder: Secondary | ICD-10-CM | POA: Diagnosis not present

## 2022-02-10 DIAGNOSIS — G8929 Other chronic pain: Secondary | ICD-10-CM | POA: Diagnosis not present

## 2022-02-10 DIAGNOSIS — M5431 Sciatica, right side: Secondary | ICD-10-CM | POA: Diagnosis not present

## 2022-02-10 DIAGNOSIS — N1832 Chronic kidney disease, stage 3b: Secondary | ICD-10-CM | POA: Diagnosis not present

## 2022-02-10 DIAGNOSIS — I129 Hypertensive chronic kidney disease with stage 1 through stage 4 chronic kidney disease, or unspecified chronic kidney disease: Secondary | ICD-10-CM | POA: Diagnosis not present

## 2022-02-12 DIAGNOSIS — I129 Hypertensive chronic kidney disease with stage 1 through stage 4 chronic kidney disease, or unspecified chronic kidney disease: Secondary | ICD-10-CM | POA: Diagnosis not present

## 2022-02-12 DIAGNOSIS — M5431 Sciatica, right side: Secondary | ICD-10-CM | POA: Diagnosis not present

## 2022-02-12 DIAGNOSIS — Z8546 Personal history of malignant neoplasm of prostate: Secondary | ICD-10-CM | POA: Diagnosis not present

## 2022-02-12 DIAGNOSIS — I700 Atherosclerosis of aorta: Secondary | ICD-10-CM | POA: Diagnosis not present

## 2022-02-12 DIAGNOSIS — D692 Other nonthrombocytopenic purpura: Secondary | ICD-10-CM | POA: Diagnosis not present

## 2022-02-12 DIAGNOSIS — I872 Venous insufficiency (chronic) (peripheral): Secondary | ICD-10-CM | POA: Diagnosis not present

## 2022-02-12 DIAGNOSIS — N1832 Chronic kidney disease, stage 3b: Secondary | ICD-10-CM | POA: Diagnosis not present

## 2022-02-12 DIAGNOSIS — Z6837 Body mass index (BMI) 37.0-37.9, adult: Secondary | ICD-10-CM | POA: Diagnosis not present

## 2022-02-17 DIAGNOSIS — M5431 Sciatica, right side: Secondary | ICD-10-CM | POA: Diagnosis not present

## 2022-02-17 DIAGNOSIS — I872 Venous insufficiency (chronic) (peripheral): Secondary | ICD-10-CM | POA: Diagnosis not present

## 2022-02-17 DIAGNOSIS — I129 Hypertensive chronic kidney disease with stage 1 through stage 4 chronic kidney disease, or unspecified chronic kidney disease: Secondary | ICD-10-CM | POA: Diagnosis not present

## 2022-02-17 DIAGNOSIS — I700 Atherosclerosis of aorta: Secondary | ICD-10-CM | POA: Diagnosis not present

## 2022-02-17 DIAGNOSIS — N1832 Chronic kidney disease, stage 3b: Secondary | ICD-10-CM | POA: Diagnosis not present

## 2022-02-17 DIAGNOSIS — D692 Other nonthrombocytopenic purpura: Secondary | ICD-10-CM | POA: Diagnosis not present

## 2022-02-23 DIAGNOSIS — D692 Other nonthrombocytopenic purpura: Secondary | ICD-10-CM | POA: Diagnosis not present

## 2022-02-23 DIAGNOSIS — L570 Actinic keratosis: Secondary | ICD-10-CM | POA: Diagnosis not present

## 2022-02-23 DIAGNOSIS — C4442 Squamous cell carcinoma of skin of scalp and neck: Secondary | ICD-10-CM | POA: Diagnosis not present

## 2022-02-23 DIAGNOSIS — Z85828 Personal history of other malignant neoplasm of skin: Secondary | ICD-10-CM | POA: Diagnosis not present

## 2022-02-23 DIAGNOSIS — D225 Melanocytic nevi of trunk: Secondary | ICD-10-CM | POA: Diagnosis not present

## 2022-02-23 DIAGNOSIS — D485 Neoplasm of uncertain behavior of skin: Secondary | ICD-10-CM | POA: Diagnosis not present

## 2022-02-24 DIAGNOSIS — I872 Venous insufficiency (chronic) (peripheral): Secondary | ICD-10-CM | POA: Diagnosis not present

## 2022-02-24 DIAGNOSIS — I700 Atherosclerosis of aorta: Secondary | ICD-10-CM | POA: Diagnosis not present

## 2022-02-24 DIAGNOSIS — I129 Hypertensive chronic kidney disease with stage 1 through stage 4 chronic kidney disease, or unspecified chronic kidney disease: Secondary | ICD-10-CM | POA: Diagnosis not present

## 2022-02-24 DIAGNOSIS — M5431 Sciatica, right side: Secondary | ICD-10-CM | POA: Diagnosis not present

## 2022-02-24 DIAGNOSIS — N1832 Chronic kidney disease, stage 3b: Secondary | ICD-10-CM | POA: Diagnosis not present

## 2022-02-24 DIAGNOSIS — D692 Other nonthrombocytopenic purpura: Secondary | ICD-10-CM | POA: Diagnosis not present

## 2022-03-01 ENCOUNTER — Other Ambulatory Visit (HOSPITAL_COMMUNITY): Payer: Self-pay | Admitting: Internal Medicine

## 2022-03-01 DIAGNOSIS — N1832 Chronic kidney disease, stage 3b: Secondary | ICD-10-CM | POA: Diagnosis not present

## 2022-03-01 DIAGNOSIS — R600 Localized edema: Secondary | ICD-10-CM

## 2022-03-01 DIAGNOSIS — M109 Gout, unspecified: Secondary | ICD-10-CM | POA: Diagnosis not present

## 2022-03-01 DIAGNOSIS — D692 Other nonthrombocytopenic purpura: Secondary | ICD-10-CM | POA: Diagnosis not present

## 2022-03-01 DIAGNOSIS — I872 Venous insufficiency (chronic) (peripheral): Secondary | ICD-10-CM | POA: Diagnosis not present

## 2022-03-01 DIAGNOSIS — I10 Essential (primary) hypertension: Secondary | ICD-10-CM | POA: Diagnosis not present

## 2022-03-01 DIAGNOSIS — L03116 Cellulitis of left lower limb: Secondary | ICD-10-CM | POA: Diagnosis not present

## 2022-03-02 ENCOUNTER — Ambulatory Visit (HOSPITAL_COMMUNITY)
Admission: RE | Admit: 2022-03-02 | Discharge: 2022-03-02 | Disposition: A | Discharge status: Home / Self Care | Payer: Medicare Other | Source: Ambulatory Visit | Attending: Internal Medicine | Admitting: Internal Medicine

## 2022-03-02 DIAGNOSIS — R972 Elevated prostate specific antigen [PSA]: Secondary | ICD-10-CM | POA: Diagnosis not present

## 2022-03-02 DIAGNOSIS — C61 Malignant neoplasm of prostate: Secondary | ICD-10-CM | POA: Diagnosis not present

## 2022-03-02 DIAGNOSIS — N3000 Acute cystitis without hematuria: Secondary | ICD-10-CM | POA: Diagnosis not present

## 2022-03-02 DIAGNOSIS — R600 Localized edema: Secondary | ICD-10-CM | POA: Insufficient documentation

## 2022-03-02 NOTE — Progress Notes (Signed)
Left lower extremity venous duplex has been completed. Preliminary results can be found in CV Proc through chart review.  Results were given to [facility] at Dr. Hans [place] office.  03/02/22 9:55 AM Christopher [name] RVT

## 2022-03-05 DIAGNOSIS — M5431 Sciatica, right side: Secondary | ICD-10-CM | POA: Diagnosis not present

## 2022-03-05 DIAGNOSIS — I872 Venous insufficiency (chronic) (peripheral): Secondary | ICD-10-CM | POA: Diagnosis not present

## 2022-03-05 DIAGNOSIS — D692 Other nonthrombocytopenic purpura: Secondary | ICD-10-CM | POA: Diagnosis not present

## 2022-03-05 DIAGNOSIS — I129 Hypertensive chronic kidney disease with stage 1 through stage 4 chronic kidney disease, or unspecified chronic kidney disease: Secondary | ICD-10-CM | POA: Diagnosis not present

## 2022-03-05 DIAGNOSIS — N1832 Chronic kidney disease, stage 3b: Secondary | ICD-10-CM | POA: Diagnosis not present

## 2022-03-05 DIAGNOSIS — I700 Atherosclerosis of aorta: Secondary | ICD-10-CM | POA: Diagnosis not present

## 2022-03-08 DIAGNOSIS — I872 Venous insufficiency (chronic) (peripheral): Secondary | ICD-10-CM | POA: Diagnosis not present

## 2022-03-08 DIAGNOSIS — R600 Localized edema: Secondary | ICD-10-CM | POA: Diagnosis not present

## 2022-03-08 DIAGNOSIS — L03116 Cellulitis of left lower limb: Secondary | ICD-10-CM | POA: Diagnosis not present

## 2022-03-11 ENCOUNTER — Encounter: Payer: Self-pay | Admitting: Podiatry

## 2022-03-12 ENCOUNTER — Other Ambulatory Visit: Payer: Self-pay | Admitting: Internal Medicine

## 2022-03-12 ENCOUNTER — Ambulatory Visit
Admission: RE | Admit: 2022-03-12 | Discharge: 2022-03-12 | Disposition: A | Discharge status: Home / Self Care | Payer: Medicare Other | Source: Ambulatory Visit | Attending: Internal Medicine | Admitting: Internal Medicine

## 2022-03-12 DIAGNOSIS — M7989 Other specified soft tissue disorders: Secondary | ICD-10-CM

## 2022-03-12 DIAGNOSIS — M79642 Pain in left hand: Secondary | ICD-10-CM | POA: Diagnosis not present

## 2022-03-14 DIAGNOSIS — M5431 Sciatica, right side: Secondary | ICD-10-CM | POA: Diagnosis not present

## 2022-03-14 DIAGNOSIS — D692 Other nonthrombocytopenic purpura: Secondary | ICD-10-CM | POA: Diagnosis not present

## 2022-03-14 DIAGNOSIS — N1832 Chronic kidney disease, stage 3b: Secondary | ICD-10-CM | POA: Diagnosis not present

## 2022-03-14 DIAGNOSIS — I129 Hypertensive chronic kidney disease with stage 1 through stage 4 chronic kidney disease, or unspecified chronic kidney disease: Secondary | ICD-10-CM | POA: Diagnosis not present

## 2022-03-14 DIAGNOSIS — I700 Atherosclerosis of aorta: Secondary | ICD-10-CM | POA: Diagnosis not present

## 2022-03-14 DIAGNOSIS — Z6837 Body mass index (BMI) 37.0-37.9, adult: Secondary | ICD-10-CM | POA: Diagnosis not present

## 2022-03-14 DIAGNOSIS — I872 Venous insufficiency (chronic) (peripheral): Secondary | ICD-10-CM | POA: Diagnosis not present

## 2022-03-14 DIAGNOSIS — Z8546 Personal history of malignant neoplasm of prostate: Secondary | ICD-10-CM | POA: Diagnosis not present

## 2022-03-15 ENCOUNTER — Ambulatory Visit: Payer: Medicare Other | Admitting: Physician Assistant

## 2022-03-15 DIAGNOSIS — I129 Hypertensive chronic kidney disease with stage 1 through stage 4 chronic kidney disease, or unspecified chronic kidney disease: Secondary | ICD-10-CM | POA: Diagnosis not present

## 2022-03-15 DIAGNOSIS — I872 Venous insufficiency (chronic) (peripheral): Secondary | ICD-10-CM | POA: Diagnosis not present

## 2022-03-15 DIAGNOSIS — M5431 Sciatica, right side: Secondary | ICD-10-CM | POA: Diagnosis not present

## 2022-03-15 DIAGNOSIS — N1832 Chronic kidney disease, stage 3b: Secondary | ICD-10-CM | POA: Diagnosis not present

## 2022-03-15 DIAGNOSIS — I700 Atherosclerosis of aorta: Secondary | ICD-10-CM | POA: Diagnosis not present

## 2022-03-15 DIAGNOSIS — D692 Other nonthrombocytopenic purpura: Secondary | ICD-10-CM | POA: Diagnosis not present

## 2022-03-26 ENCOUNTER — Encounter: Payer: Self-pay | Admitting: Podiatry

## 2022-03-26 DIAGNOSIS — M5431 Sciatica, right side: Secondary | ICD-10-CM | POA: Diagnosis not present

## 2022-03-26 DIAGNOSIS — I700 Atherosclerosis of aorta: Secondary | ICD-10-CM | POA: Diagnosis not present

## 2022-03-26 DIAGNOSIS — D692 Other nonthrombocytopenic purpura: Secondary | ICD-10-CM | POA: Diagnosis not present

## 2022-03-26 DIAGNOSIS — I129 Hypertensive chronic kidney disease with stage 1 through stage 4 chronic kidney disease, or unspecified chronic kidney disease: Secondary | ICD-10-CM | POA: Diagnosis not present

## 2022-03-26 DIAGNOSIS — N1832 Chronic kidney disease, stage 3b: Secondary | ICD-10-CM | POA: Diagnosis not present

## 2022-03-26 DIAGNOSIS — I872 Venous insufficiency (chronic) (peripheral): Secondary | ICD-10-CM | POA: Diagnosis not present

## 2022-03-29 DIAGNOSIS — I10 Essential (primary) hypertension: Secondary | ICD-10-CM | POA: Diagnosis not present

## 2022-03-29 DIAGNOSIS — I872 Venous insufficiency (chronic) (peripheral): Secondary | ICD-10-CM | POA: Diagnosis not present

## 2022-03-29 DIAGNOSIS — R600 Localized edema: Secondary | ICD-10-CM | POA: Diagnosis not present

## 2022-03-29 DIAGNOSIS — M19012 Primary osteoarthritis, left shoulder: Secondary | ICD-10-CM | POA: Diagnosis not present

## 2022-03-30 ENCOUNTER — Ambulatory Visit (HOSPITAL_COMMUNITY)
Admission: RE | Admit: 2022-03-30 | Discharge: 2022-03-30 | Disposition: A | Discharge status: Home / Self Care | Payer: Medicare Other | Source: Ambulatory Visit | Attending: Internal Medicine | Admitting: Internal Medicine

## 2022-03-30 ENCOUNTER — Other Ambulatory Visit (HOSPITAL_COMMUNITY): Payer: Self-pay | Admitting: Internal Medicine

## 2022-03-30 DIAGNOSIS — R609 Edema, unspecified: Secondary | ICD-10-CM | POA: Diagnosis not present

## 2022-04-01 DIAGNOSIS — M7989 Other specified soft tissue disorders: Secondary | ICD-10-CM | POA: Diagnosis not present

## 2022-04-02 ENCOUNTER — Ambulatory Visit: Payer: Medicare Other | Admitting: Podiatry

## 2022-04-07 ENCOUNTER — Ambulatory Visit (INDEPENDENT_AMBULATORY_CARE_PROVIDER_SITE_OTHER): Payer: Medicare Other | Admitting: Podiatrist

## 2022-04-07 DIAGNOSIS — B351 Tinea unguium: Secondary | ICD-10-CM | POA: Diagnosis not present

## 2022-04-07 DIAGNOSIS — M79675 Pain in left toe(s): Secondary | ICD-10-CM

## 2022-04-07 DIAGNOSIS — M79674 Pain in right toe(s): Secondary | ICD-10-CM | POA: Diagnosis not present

## 2022-04-07 NOTE — Progress Notes (Signed)
  Subjective:  Patient ID: Christopher [name], male    DOB: 04-Oct-1928,  MRN: [id number]  Alix Dedios presents to clinic today for painful thick toenails that are difficult to trim. Pain interferes with ambulation. Aggravating factors include wearing enclosed shoe gear. Pain is relieved with periodic professional debridement.  Chief Complaint  Patient presents with   Nail Problem    RFC PCP-[place] PCP VST-3 weeks ago   New problem(s): None.   His sister is present during today's visit. She recently lost her husband.   PCP is Kathalene Frames, MD.  Allergies  Allergen Reactions   Chicken Allergy Anaphylaxis    Other reaction(s): Unknown   Other Anaphylaxis and Swelling    Chicken   Mirabegron     Other reaction(s): side pain   Penicillins Itching and Swelling   Poultry Meal Swelling    Review of Systems: Negative except as noted in the HPI.  Objective:  There were no vitals filed for this visit.  [name] is a pleasant 89 y.o. male morbidly obese in NAD. AAO x 3.  Neurovascular Examination: Capillary refill time to digits immediate b/l lower extremities. Palpable DP pulse(s) b/l lower extremities Palpable PT pulse(s) b/l lower extremities Pedal hair absent. Lower extremity skin temperature gradient within normal limits. Dependent rubor noted left lower extremity. No pain with calf compression b/l. Evidence of chronic venous insufficiency b/l lower extremities.  Protective sensation intact 5/5 intact bilaterally with 10g monofilament b/l.  Dermatological:  Skin warm and supple b/l lower extremities. No open wounds b/l LE. No interdigital macerations b/l lower extremities. Toenails 1-5 b/l elongated, discolored, dystrophic, thickened, crumbly with subungual debris and tenderness to dorsal palpation.  Incurvated nailplate lateral border of R 2nd toe with tenderness to palpation. No erythema, no edema, no drainage noted. No fluctuance.   Musculoskeletal:  Normal  muscle strength 5/5 to all lower extremity muscle groups bilaterally. No gross bony deformities b/l lower extremities. Ambulates independently without assistive aids.  Assessment/Plan:   ICD-10-CM   1. Pain due to onychomycosis of toenails of both feet  B35.1    M79.675    M79.674         Debridement of toenails was recommended.  Onychoreduction of symptomatic toenails was performed via nail nipper and power burr without iatrogenic incident.  Patient was instructed on signs and symptoms of infection and was told to call immediately should any of these arise.  Recommended follow up in 3 months or instructed to call sooner if any pedal concerns arise.    Bronson Ing, DPM

## 2022-04-15 DIAGNOSIS — L03115 Cellulitis of right lower limb: Secondary | ICD-10-CM | POA: Diagnosis not present

## 2022-04-15 DIAGNOSIS — M7989 Other specified soft tissue disorders: Secondary | ICD-10-CM | POA: Diagnosis not present

## 2022-04-15 DIAGNOSIS — M109 Gout, unspecified: Secondary | ICD-10-CM | POA: Diagnosis not present

## 2022-04-16 ENCOUNTER — Encounter (HOSPITAL_COMMUNITY): Payer: Self-pay | Admitting: Internal Medicine

## 2022-04-16 ENCOUNTER — Ambulatory Visit (HOSPITAL_COMMUNITY)
Admission: RE | Admit: 2022-04-16 | Discharge: 2022-04-16 | Disposition: A | Discharge status: Home / Self Care | Payer: Medicare Other | Source: Ambulatory Visit | Attending: Internal Medicine | Admitting: Internal Medicine

## 2022-04-16 ENCOUNTER — Other Ambulatory Visit (HOSPITAL_COMMUNITY): Payer: Self-pay | Admitting: Internal Medicine

## 2022-04-16 DIAGNOSIS — R600 Localized edema: Secondary | ICD-10-CM | POA: Diagnosis not present

## 2022-05-03 DIAGNOSIS — M109 Gout, unspecified: Secondary | ICD-10-CM | POA: Diagnosis not present

## 2022-05-20 DIAGNOSIS — I10 Essential (primary) hypertension: Secondary | ICD-10-CM | POA: Diagnosis not present

## 2022-05-20 DIAGNOSIS — M109 Gout, unspecified: Secondary | ICD-10-CM | POA: Diagnosis not present

## 2022-05-25 DIAGNOSIS — C4442 Squamous cell carcinoma of skin of scalp and neck: Secondary | ICD-10-CM | POA: Diagnosis not present

## 2022-05-25 DIAGNOSIS — Z85828 Personal history of other malignant neoplasm of skin: Secondary | ICD-10-CM | POA: Diagnosis not present

## 2022-06-04 DIAGNOSIS — Z7409 Other reduced mobility: Secondary | ICD-10-CM | POA: Diagnosis not present

## 2022-06-04 DIAGNOSIS — I10 Essential (primary) hypertension: Secondary | ICD-10-CM | POA: Diagnosis not present

## 2022-06-04 DIAGNOSIS — M25511 Pain in right shoulder: Secondary | ICD-10-CM | POA: Diagnosis not present

## 2022-06-04 DIAGNOSIS — M109 Gout, unspecified: Secondary | ICD-10-CM | POA: Diagnosis not present

## 2022-06-04 DIAGNOSIS — G8929 Other chronic pain: Secondary | ICD-10-CM | POA: Diagnosis not present

## 2022-06-18 ENCOUNTER — Ambulatory Visit: Payer: Medicare Other | Admitting: Podiatry

## 2022-06-21 DIAGNOSIS — Z96651 Presence of right artificial knee joint: Secondary | ICD-10-CM | POA: Diagnosis not present

## 2022-06-21 DIAGNOSIS — N1832 Chronic kidney disease, stage 3b: Secondary | ICD-10-CM | POA: Diagnosis not present

## 2022-06-21 DIAGNOSIS — M1712 Unilateral primary osteoarthritis, left knee: Secondary | ICD-10-CM | POA: Diagnosis not present

## 2022-06-21 DIAGNOSIS — Z6841 Body Mass Index (BMI) 40.0 and over, adult: Secondary | ICD-10-CM | POA: Diagnosis not present

## 2022-06-21 DIAGNOSIS — R600 Localized edema: Secondary | ICD-10-CM | POA: Diagnosis not present

## 2022-06-21 DIAGNOSIS — Z8616 Personal history of COVID-19: Secondary | ICD-10-CM | POA: Diagnosis not present

## 2022-06-21 DIAGNOSIS — Z96641 Presence of right artificial hip joint: Secondary | ICD-10-CM | POA: Diagnosis not present

## 2022-06-21 DIAGNOSIS — I129 Hypertensive chronic kidney disease with stage 1 through stage 4 chronic kidney disease, or unspecified chronic kidney disease: Secondary | ICD-10-CM | POA: Diagnosis not present

## 2022-06-21 DIAGNOSIS — M25511 Pain in right shoulder: Secondary | ICD-10-CM | POA: Diagnosis not present

## 2022-06-21 DIAGNOSIS — Z8546 Personal history of malignant neoplasm of prostate: Secondary | ICD-10-CM | POA: Diagnosis not present

## 2022-06-21 DIAGNOSIS — G8929 Other chronic pain: Secondary | ICD-10-CM | POA: Diagnosis not present

## 2022-06-21 DIAGNOSIS — M1A9XX0 Chronic gout, unspecified, without tophus (tophi): Secondary | ICD-10-CM | POA: Diagnosis not present

## 2022-06-21 DIAGNOSIS — I872 Venous insufficiency (chronic) (peripheral): Secondary | ICD-10-CM | POA: Diagnosis not present

## 2022-06-21 DIAGNOSIS — I700 Atherosclerosis of aorta: Secondary | ICD-10-CM | POA: Diagnosis not present

## 2022-06-21 DIAGNOSIS — N401 Enlarged prostate with lower urinary tract symptoms: Secondary | ICD-10-CM | POA: Diagnosis not present

## 2022-06-21 DIAGNOSIS — M545 Low back pain, unspecified: Secondary | ICD-10-CM | POA: Diagnosis not present

## 2022-06-21 DIAGNOSIS — R350 Frequency of micturition: Secondary | ICD-10-CM | POA: Diagnosis not present

## 2022-06-21 DIAGNOSIS — Z87891 Personal history of nicotine dependence: Secondary | ICD-10-CM | POA: Diagnosis not present

## 2022-06-21 DIAGNOSIS — E7800 Pure hypercholesterolemia, unspecified: Secondary | ICD-10-CM | POA: Diagnosis not present

## 2022-06-25 DIAGNOSIS — M1A9XX0 Chronic gout, unspecified, without tophus (tophi): Secondary | ICD-10-CM | POA: Diagnosis not present

## 2022-06-25 DIAGNOSIS — G8929 Other chronic pain: Secondary | ICD-10-CM | POA: Diagnosis not present

## 2022-06-25 DIAGNOSIS — I700 Atherosclerosis of aorta: Secondary | ICD-10-CM | POA: Diagnosis not present

## 2022-06-25 DIAGNOSIS — M545 Low back pain, unspecified: Secondary | ICD-10-CM | POA: Diagnosis not present

## 2022-06-25 DIAGNOSIS — N1832 Chronic kidney disease, stage 3b: Secondary | ICD-10-CM | POA: Diagnosis not present

## 2022-06-25 DIAGNOSIS — I129 Hypertensive chronic kidney disease with stage 1 through stage 4 chronic kidney disease, or unspecified chronic kidney disease: Secondary | ICD-10-CM | POA: Diagnosis not present

## 2022-06-29 DIAGNOSIS — I700 Atherosclerosis of aorta: Secondary | ICD-10-CM | POA: Diagnosis not present

## 2022-06-29 DIAGNOSIS — G8929 Other chronic pain: Secondary | ICD-10-CM | POA: Diagnosis not present

## 2022-06-29 DIAGNOSIS — M1A9XX0 Chronic gout, unspecified, without tophus (tophi): Secondary | ICD-10-CM | POA: Diagnosis not present

## 2022-06-29 DIAGNOSIS — M545 Low back pain, unspecified: Secondary | ICD-10-CM | POA: Diagnosis not present

## 2022-06-29 DIAGNOSIS — I129 Hypertensive chronic kidney disease with stage 1 through stage 4 chronic kidney disease, or unspecified chronic kidney disease: Secondary | ICD-10-CM | POA: Diagnosis not present

## 2022-06-29 DIAGNOSIS — N1832 Chronic kidney disease, stage 3b: Secondary | ICD-10-CM | POA: Diagnosis not present

## 2022-07-06 DIAGNOSIS — M545 Low back pain, unspecified: Secondary | ICD-10-CM | POA: Diagnosis not present

## 2022-07-06 DIAGNOSIS — G8929 Other chronic pain: Secondary | ICD-10-CM | POA: Diagnosis not present

## 2022-07-06 DIAGNOSIS — M1A9XX0 Chronic gout, unspecified, without tophus (tophi): Secondary | ICD-10-CM | POA: Diagnosis not present

## 2022-07-06 DIAGNOSIS — N1832 Chronic kidney disease, stage 3b: Secondary | ICD-10-CM | POA: Diagnosis not present

## 2022-07-06 DIAGNOSIS — I700 Atherosclerosis of aorta: Secondary | ICD-10-CM | POA: Diagnosis not present

## 2022-07-06 DIAGNOSIS — I129 Hypertensive chronic kidney disease with stage 1 through stage 4 chronic kidney disease, or unspecified chronic kidney disease: Secondary | ICD-10-CM | POA: Diagnosis not present

## 2022-07-12 DIAGNOSIS — G8929 Other chronic pain: Secondary | ICD-10-CM | POA: Diagnosis not present

## 2022-07-12 DIAGNOSIS — M545 Low back pain, unspecified: Secondary | ICD-10-CM | POA: Diagnosis not present

## 2022-07-12 DIAGNOSIS — N1832 Chronic kidney disease, stage 3b: Secondary | ICD-10-CM | POA: Diagnosis not present

## 2022-07-12 DIAGNOSIS — I700 Atherosclerosis of aorta: Secondary | ICD-10-CM | POA: Diagnosis not present

## 2022-07-12 DIAGNOSIS — M1A9XX0 Chronic gout, unspecified, without tophus (tophi): Secondary | ICD-10-CM | POA: Diagnosis not present

## 2022-07-12 DIAGNOSIS — I129 Hypertensive chronic kidney disease with stage 1 through stage 4 chronic kidney disease, or unspecified chronic kidney disease: Secondary | ICD-10-CM | POA: Diagnosis not present

## 2022-07-21 DIAGNOSIS — E7800 Pure hypercholesterolemia, unspecified: Secondary | ICD-10-CM | POA: Diagnosis not present

## 2022-07-21 DIAGNOSIS — I700 Atherosclerosis of aorta: Secondary | ICD-10-CM | POA: Diagnosis not present

## 2022-07-21 DIAGNOSIS — R600 Localized edema: Secondary | ICD-10-CM | POA: Diagnosis not present

## 2022-07-21 DIAGNOSIS — Z96641 Presence of right artificial hip joint: Secondary | ICD-10-CM | POA: Diagnosis not present

## 2022-07-21 DIAGNOSIS — Z8616 Personal history of COVID-19: Secondary | ICD-10-CM | POA: Diagnosis not present

## 2022-07-21 DIAGNOSIS — R350 Frequency of micturition: Secondary | ICD-10-CM | POA: Diagnosis not present

## 2022-07-21 DIAGNOSIS — Z87891 Personal history of nicotine dependence: Secondary | ICD-10-CM | POA: Diagnosis not present

## 2022-07-21 DIAGNOSIS — I129 Hypertensive chronic kidney disease with stage 1 through stage 4 chronic kidney disease, or unspecified chronic kidney disease: Secondary | ICD-10-CM | POA: Diagnosis not present

## 2022-07-21 DIAGNOSIS — M545 Low back pain, unspecified: Secondary | ICD-10-CM | POA: Diagnosis not present

## 2022-07-21 DIAGNOSIS — M1A9XX0 Chronic gout, unspecified, without tophus (tophi): Secondary | ICD-10-CM | POA: Diagnosis not present

## 2022-07-21 DIAGNOSIS — Z6841 Body Mass Index (BMI) 40.0 and over, adult: Secondary | ICD-10-CM | POA: Diagnosis not present

## 2022-07-21 DIAGNOSIS — N1832 Chronic kidney disease, stage 3b: Secondary | ICD-10-CM | POA: Diagnosis not present

## 2022-07-21 DIAGNOSIS — Z96651 Presence of right artificial knee joint: Secondary | ICD-10-CM | POA: Diagnosis not present

## 2022-07-21 DIAGNOSIS — I872 Venous insufficiency (chronic) (peripheral): Secondary | ICD-10-CM | POA: Diagnosis not present

## 2022-07-21 DIAGNOSIS — Z8546 Personal history of malignant neoplasm of prostate: Secondary | ICD-10-CM | POA: Diagnosis not present

## 2022-07-21 DIAGNOSIS — G8929 Other chronic pain: Secondary | ICD-10-CM | POA: Diagnosis not present

## 2022-07-21 DIAGNOSIS — M1712 Unilateral primary osteoarthritis, left knee: Secondary | ICD-10-CM | POA: Diagnosis not present

## 2022-07-21 DIAGNOSIS — N401 Enlarged prostate with lower urinary tract symptoms: Secondary | ICD-10-CM | POA: Diagnosis not present

## 2022-07-21 DIAGNOSIS — M25511 Pain in right shoulder: Secondary | ICD-10-CM | POA: Diagnosis not present

## 2022-07-23 DIAGNOSIS — M545 Low back pain, unspecified: Secondary | ICD-10-CM | POA: Diagnosis not present

## 2022-07-23 DIAGNOSIS — N1832 Chronic kidney disease, stage 3b: Secondary | ICD-10-CM | POA: Diagnosis not present

## 2022-07-23 DIAGNOSIS — M1A9XX0 Chronic gout, unspecified, without tophus (tophi): Secondary | ICD-10-CM | POA: Diagnosis not present

## 2022-07-23 DIAGNOSIS — I700 Atherosclerosis of aorta: Secondary | ICD-10-CM | POA: Diagnosis not present

## 2022-07-23 DIAGNOSIS — G8929 Other chronic pain: Secondary | ICD-10-CM | POA: Diagnosis not present

## 2022-07-23 DIAGNOSIS — I129 Hypertensive chronic kidney disease with stage 1 through stage 4 chronic kidney disease, or unspecified chronic kidney disease: Secondary | ICD-10-CM | POA: Diagnosis not present

## 2022-07-28 DIAGNOSIS — N1832 Chronic kidney disease, stage 3b: Secondary | ICD-10-CM | POA: Diagnosis not present

## 2022-07-28 DIAGNOSIS — I129 Hypertensive chronic kidney disease with stage 1 through stage 4 chronic kidney disease, or unspecified chronic kidney disease: Secondary | ICD-10-CM | POA: Diagnosis not present

## 2022-07-28 DIAGNOSIS — M545 Low back pain, unspecified: Secondary | ICD-10-CM | POA: Diagnosis not present

## 2022-07-28 DIAGNOSIS — I700 Atherosclerosis of aorta: Secondary | ICD-10-CM | POA: Diagnosis not present

## 2022-07-28 DIAGNOSIS — M1A9XX0 Chronic gout, unspecified, without tophus (tophi): Secondary | ICD-10-CM | POA: Diagnosis not present

## 2022-07-28 DIAGNOSIS — G8929 Other chronic pain: Secondary | ICD-10-CM | POA: Diagnosis not present

## 2022-08-03 DIAGNOSIS — I700 Atherosclerosis of aorta: Secondary | ICD-10-CM | POA: Diagnosis not present

## 2022-08-03 DIAGNOSIS — M545 Low back pain, unspecified: Secondary | ICD-10-CM | POA: Diagnosis not present

## 2022-08-03 DIAGNOSIS — G8929 Other chronic pain: Secondary | ICD-10-CM | POA: Diagnosis not present

## 2022-08-03 DIAGNOSIS — I129 Hypertensive chronic kidney disease with stage 1 through stage 4 chronic kidney disease, or unspecified chronic kidney disease: Secondary | ICD-10-CM | POA: Diagnosis not present

## 2022-08-03 DIAGNOSIS — N1832 Chronic kidney disease, stage 3b: Secondary | ICD-10-CM | POA: Diagnosis not present

## 2022-08-03 DIAGNOSIS — M1A9XX0 Chronic gout, unspecified, without tophus (tophi): Secondary | ICD-10-CM | POA: Diagnosis not present

## 2022-08-10 ENCOUNTER — Ambulatory Visit (INDEPENDENT_AMBULATORY_CARE_PROVIDER_SITE_OTHER): Payer: Medicare Other | Admitting: Podiatry

## 2022-08-10 ENCOUNTER — Encounter: Payer: Self-pay | Admitting: Podiatry

## 2022-08-10 DIAGNOSIS — M79674 Pain in right toe(s): Secondary | ICD-10-CM

## 2022-08-10 DIAGNOSIS — M79675 Pain in left toe(s): Secondary | ICD-10-CM

## 2022-08-10 DIAGNOSIS — B351 Tinea unguium: Secondary | ICD-10-CM

## 2022-08-15 NOTE — Progress Notes (Signed)
  Subjective:  Patient ID: Christopher [name], male    DOB: [birth year],  MRN: [id number]  Christopher [name] presents to clinic today for painful elongated mycotic toenails 1-5 bilaterally which are tender when wearing enclosed shoe gear. Pain is relieved with periodic professional debridement.  Chief Complaint  Patient presents with   Nail Problem    RFC,Referring Provider [name], MD,LOV:06/24      New problem(s): None.   PCP is [name], MD.  Allergies  Allergen Reactions   Chicken Allergy Anaphylaxis    Other reaction(s): Unknown   Macrolides And Ketolides     Other Reaction(s): Other (See Comments)  Kidney pain   Other Anaphylaxis and Swelling    Chicken   Mirabegron     Other reaction(s): side pain   Penicillins Itching and Swelling   Poultry Meal Swelling    Review of Systems: Negative except as noted in the HPI.  Objective: No changes noted in today's physical examination. There were no vitals filed for this visit. Christopher [name] is a pleasant 89 y.o. male morbidly obese in NAD. AAO x 3.  Neurovascular Examination: Capillary refill time to digits immediate b/l lower extremities. Palpable DP pulse(s) b/l lower extremities Palpable PT pulse(s) b/l lower extremities Pedal hair absent. Lower extremity skin temperature gradient within normal limits. +1 pitting edema noted BLE. Dependent rubor noted left lower extremity. No pain with calf compression b/l. Evidence of chronic venous insufficiency b/l lower extremities.  Protective sensation intact 5/5 intact bilaterally with 10g monofilament b/l.  Dermatological:  Skin warm and supple b/l lower extremities. No open wounds b/l LE. No interdigital macerations b/l lower extremities. Toenails 1-5 b/l elongated, discolored, dystrophic, thickened, crumbly with subungual debris and tenderness to dorsal palpation.  Incurvated nailplate lateral border of R 2nd toe with tenderness to palpation. No erythema, no edema,  no drainage noted. No fluctuance.   Musculoskeletal:  Normal muscle strength 5/5 to all lower extremity muscle groups bilaterally. No gross bony deformities b/l lower extremities. Ambulates independently without assistive aids.  Assessment/Plan: 1. Pain due to onychomycosis of toenails of both feet     -Consent given for treatment as described below: -Examined patient. -Continue foot and shoe inspections daily. Monitor blood glucose per PCP/Endocrinologist's recommendations. -Patient to continue soft, supportive shoe gear daily. -Mycotic toenails 1-5 bilaterally were debrided in length and girth with sterile nail nippers and dremel without incident. -Patient/POA to call should there be question/concern in the interim.   Return in about 3 months (around 11/10/2022).  [name], DPM

## 2022-08-16 DIAGNOSIS — M545 Low back pain, unspecified: Secondary | ICD-10-CM | POA: Diagnosis not present

## 2022-08-16 DIAGNOSIS — G8929 Other chronic pain: Secondary | ICD-10-CM | POA: Diagnosis not present

## 2022-08-16 DIAGNOSIS — I700 Atherosclerosis of aorta: Secondary | ICD-10-CM | POA: Diagnosis not present

## 2022-08-16 DIAGNOSIS — N1832 Chronic kidney disease, stage 3b: Secondary | ICD-10-CM | POA: Diagnosis not present

## 2022-08-16 DIAGNOSIS — M1A9XX0 Chronic gout, unspecified, without tophus (tophi): Secondary | ICD-10-CM | POA: Diagnosis not present

## 2022-08-16 DIAGNOSIS — I129 Hypertensive chronic kidney disease with stage 1 through stage 4 chronic kidney disease, or unspecified chronic kidney disease: Secondary | ICD-10-CM | POA: Diagnosis not present

## 2022-09-06 DIAGNOSIS — M25511 Pain in right shoulder: Secondary | ICD-10-CM | POA: Diagnosis not present

## 2022-09-06 DIAGNOSIS — M109 Gout, unspecified: Secondary | ICD-10-CM | POA: Diagnosis not present

## 2022-09-06 DIAGNOSIS — D692 Other nonthrombocytopenic purpura: Secondary | ICD-10-CM | POA: Diagnosis not present

## 2022-09-06 DIAGNOSIS — Z0000 Encounter for general adult medical examination without abnormal findings: Secondary | ICD-10-CM | POA: Diagnosis not present

## 2022-09-06 DIAGNOSIS — Z23 Encounter for immunization: Secondary | ICD-10-CM | POA: Diagnosis not present

## 2022-09-06 DIAGNOSIS — I10 Essential (primary) hypertension: Secondary | ICD-10-CM | POA: Diagnosis not present

## 2022-09-06 DIAGNOSIS — Z7409 Other reduced mobility: Secondary | ICD-10-CM | POA: Diagnosis not present

## 2022-09-06 DIAGNOSIS — G4700 Insomnia, unspecified: Secondary | ICD-10-CM | POA: Diagnosis not present

## 2022-09-06 DIAGNOSIS — N1832 Chronic kidney disease, stage 3b: Secondary | ICD-10-CM | POA: Diagnosis not present

## 2022-09-06 DIAGNOSIS — Z1331 Encounter for screening for depression: Secondary | ICD-10-CM | POA: Diagnosis not present

## 2022-09-06 DIAGNOSIS — R600 Localized edema: Secondary | ICD-10-CM | POA: Diagnosis not present

## 2022-09-06 DIAGNOSIS — I872 Venous insufficiency (chronic) (peripheral): Secondary | ICD-10-CM | POA: Diagnosis not present

## 2022-09-06 DIAGNOSIS — Z79899 Other long term (current) drug therapy: Secondary | ICD-10-CM | POA: Diagnosis not present

## 2022-09-06 DIAGNOSIS — M25562 Pain in left knee: Secondary | ICD-10-CM | POA: Diagnosis not present

## 2022-11-02 DIAGNOSIS — H353131 Nonexudative age-related macular degeneration, bilateral, early dry stage: Secondary | ICD-10-CM | POA: Diagnosis not present

## 2022-11-02 DIAGNOSIS — H169 Unspecified keratitis: Secondary | ICD-10-CM | POA: Diagnosis not present

## 2022-11-02 DIAGNOSIS — H04202 Unspecified epiphora, left lacrimal gland: Secondary | ICD-10-CM | POA: Diagnosis not present

## 2022-11-02 DIAGNOSIS — H02135 Senile ectropion of left lower eyelid: Secondary | ICD-10-CM | POA: Diagnosis not present

## 2022-11-08 DIAGNOSIS — I10 Essential (primary) hypertension: Secondary | ICD-10-CM | POA: Diagnosis not present

## 2022-11-08 DIAGNOSIS — Z7409 Other reduced mobility: Secondary | ICD-10-CM | POA: Diagnosis not present

## 2022-11-08 DIAGNOSIS — Z23 Encounter for immunization: Secondary | ICD-10-CM | POA: Diagnosis not present

## 2022-11-08 DIAGNOSIS — J329 Chronic sinusitis, unspecified: Secondary | ICD-10-CM | POA: Diagnosis not present

## 2022-11-08 DIAGNOSIS — N1832 Chronic kidney disease, stage 3b: Secondary | ICD-10-CM | POA: Diagnosis not present

## 2022-11-08 DIAGNOSIS — I872 Venous insufficiency (chronic) (peripheral): Secondary | ICD-10-CM | POA: Diagnosis not present

## 2022-11-08 DIAGNOSIS — M109 Gout, unspecified: Secondary | ICD-10-CM | POA: Diagnosis not present

## 2022-11-08 DIAGNOSIS — R600 Localized edema: Secondary | ICD-10-CM | POA: Diagnosis not present

## 2022-11-11 DIAGNOSIS — L570 Actinic keratosis: Secondary | ICD-10-CM | POA: Diagnosis not present

## 2022-11-11 DIAGNOSIS — D044 Carcinoma in situ of skin of scalp and neck: Secondary | ICD-10-CM | POA: Diagnosis not present

## 2022-11-11 DIAGNOSIS — Z85828 Personal history of other malignant neoplasm of skin: Secondary | ICD-10-CM | POA: Diagnosis not present

## 2022-11-11 DIAGNOSIS — D485 Neoplasm of uncertain behavior of skin: Secondary | ICD-10-CM | POA: Diagnosis not present

## 2022-11-30 ENCOUNTER — Encounter: Payer: Self-pay | Admitting: Podiatry

## 2022-11-30 ENCOUNTER — Ambulatory Visit (INDEPENDENT_AMBULATORY_CARE_PROVIDER_SITE_OTHER): Payer: Medicare Other | Admitting: Podiatry

## 2022-11-30 DIAGNOSIS — M79675 Pain in left toe(s): Secondary | ICD-10-CM

## 2022-11-30 DIAGNOSIS — B351 Tinea unguium: Secondary | ICD-10-CM | POA: Diagnosis not present

## 2022-11-30 DIAGNOSIS — M79674 Pain in right toe(s): Secondary | ICD-10-CM | POA: Diagnosis not present

## 2022-12-05 NOTE — Progress Notes (Signed)
  Subjective:  Patient ID: Christopher [name], male    DOB: 05/24/1928,  MRN: [id number]  89 y.o. male presents painful elongated mycotic toenails 1-5 bilaterally which are tender when wearing enclosed shoe gear. Pain is relieved with periodic professional debridement. He is accompanied by his sister on today's visit.  New problem(s): None   PCP is [name], MD , and last visit was September 06, 2022.  Allergies  Allergen Reactions   Chicken Allergy Anaphylaxis    Other reaction(s): Unknown   Macrolides And Ketolides     Other Reaction(s): Other (See Comments)  Kidney pain   Other Anaphylaxis and Swelling    Chicken   Mirabegron     Other reaction(s): side pain   Penicillins Itching and Swelling   Poultry Meal Swelling    Review of Systems: Negative except as noted in the HPI.   Objective:  Christopher [name] is a pleasant 89 y.o. male morbidly obese in NAD. AAO x 3.  Vascular Examination: VNeurovascular Examination: Capillary refill time to digits immediate b/l lower extremities. Palpable DP pulse(s) b/l lower extremities Palpable PT pulse(s) b/l lower extremities Pedal hair absent. Lower extremity skin temperature gradient within normal limits.  Trace edema noted BLE. Dependent rubor noted left lower extremity. No pain with calf compression b/l. Evidence of chronic venous insufficiency b/l lower extremities.  Protective sensation intact 5/5 intact bilaterally with 10g monofilament b/l.  Dermatological Examination: Pedal skin with normal turgor, texture and tone b/l. No open wounds nor interdigital macerations noted. Toenails 1-5 b/l thick, discolored, elongated with subungual debris and pain on dorsal palpation. No hyperkeratotic lesions noted b/l.   Musculoskeletal Examination: Muscle strength 5/5 to b/l LE.  No pain, crepitus noted b/l. No gross pedal deformities. Patient ambulates independently without assistive aids.   Radiographs: None  Last A1c:       No data to  display         Assessment:   1. Pain due to onychomycosis of toenails of both feet    Plan:  -Patient was evaluated today. All questions/concerns addressed on today's visit. -Continue supportive shoe gear daily. -Toenails 1-5 bilaterally were debrided in length and girth with sterile nail nippers and dremel. Pinpoint bleeding of left great toe addressed with Lumicain Hemostatic Solution, cleansed with alcohol. Triple antibiotic ointment applied. No further treatment required by patient/caregiver. -Patient/POA to call should there be question/concern in the interim.  Return in about 3 months (around 03/02/2023).  [name], DPM      [place] LOCATION: [address], [place] [address]                   Office [phone]   [facility] LOCATION: [address] [place], [place] [address] Office [phone]

## 2022-12-15 DIAGNOSIS — H169 Unspecified keratitis: Secondary | ICD-10-CM | POA: Diagnosis not present

## 2022-12-15 DIAGNOSIS — H04202 Unspecified epiphora, left lacrimal gland: Secondary | ICD-10-CM | POA: Diagnosis not present

## 2022-12-20 DIAGNOSIS — Z79899 Other long term (current) drug therapy: Secondary | ICD-10-CM | POA: Diagnosis not present

## 2023-02-03 DIAGNOSIS — Z79899 Other long term (current) drug therapy: Secondary | ICD-10-CM | POA: Diagnosis not present

## 2023-04-05 ENCOUNTER — Encounter: Payer: Self-pay | Admitting: Podiatry

## 2023-04-05 ENCOUNTER — Ambulatory Visit (INDEPENDENT_AMBULATORY_CARE_PROVIDER_SITE_OTHER): Payer: Medicare Other | Admitting: Podiatry

## 2023-04-05 VITALS — Ht 68.0 in | Wt 258.0 lb

## 2023-04-05 DIAGNOSIS — M79675 Pain in left toe(s): Secondary | ICD-10-CM

## 2023-04-05 DIAGNOSIS — B351 Tinea unguium: Secondary | ICD-10-CM

## 2023-04-05 DIAGNOSIS — M79674 Pain in right toe(s): Secondary | ICD-10-CM

## 2023-04-10 NOTE — Progress Notes (Signed)
  Subjective:  Patient ID: Christopher [name], male    DOB: 08-Feb-1928,  MRN: [id number]  Christopher [name] presents to clinic today for painful, elongated thickened toenails x 10 which are symptomatic when wearing enclosed shoe gear. This interferes with his/her daily activities.  Chief Complaint  Patient presents with   Nail Problem    Pt is here for [facility] PCP is Dr Orson Aloe and LOV was in January.   New problem(s): None.   PCP is Christopher Aspen, MD.  Allergies  Allergen Reactions   Chicken Allergy Anaphylaxis    Other reaction(s): Unknown   Macrolides And Ketolides     Other Reaction(s): Other (See Comments)  Kidney pain   Other Anaphylaxis and Swelling    Chicken   Mirabegron     Other reaction(s): side pain   Penicillins Itching and Swelling   Poultry Meal Swelling    Review of Systems: Negative except as noted in the HPI.  Objective:  There were no vitals filed for this visit. Emerick [name] is a pleasant 89 y.o. male morbidly obese in NAD. AAO x 3.  Neurovascular Examination: Capillary refill time to digits immediate b/l lower extremities. Palpable DP pulse(s) b/l lower extremities Palpable PT pulse(s) b/l lower extremities Pedal hair absent. Lower extremity skin temperature gradient within normal limits.  Trace edema noted BLE. Dependent rubor noted left lower extremity. No pain with calf compression b/l. Evidence of chronic venous insufficiency b/l lower extremities.  Protective sensation intact 5/5 intact bilaterally with 10g monofilament b/l.  Dermatological Examination: Pedal skin with normal turgor, texture and tone b/l. No open wounds nor interdigital macerations noted. Toenails 1-5 b/l thick, discolored, elongated with subungual debris and pain on dorsal palpation. No hyperkeratotic lesions noted b/l.   Musculoskeletal Examination: Muscle strength 5/5 to b/l LE.  No pain, crepitus noted b/l. No gross pedal deformities. Patient ambulates independently without  assistive aids.   Radiographs: None  Assessment/Plan: 1. Pain due to onychomycosis of toenails of both feet     Consent given for treatment. Patient examined. All patient's and/or POA's questions/concerns addressed on today's visit. Mycotic toenails 1-5 debrided in length and girth without incident. Continue soft, supportive shoe gear daily. Report any pedal injuries to medical professional. Call office if there are any quesitons/concerns. -Patient/POA to call should there be question/concern in the interim.   Return in about 3 months (around 07/06/2023).  Christopher [name], DPM      [place] LOCATION: [address], [place] [address]                   Office [phone]   [facility] LOCATION: [address] [place], [place] [address] Office [phone]

## 2023-04-20 DIAGNOSIS — I10 Essential (primary) hypertension: Secondary | ICD-10-CM | POA: Diagnosis not present

## 2023-04-20 DIAGNOSIS — R600 Localized edema: Secondary | ICD-10-CM | POA: Diagnosis not present

## 2023-04-20 DIAGNOSIS — J329 Chronic sinusitis, unspecified: Secondary | ICD-10-CM | POA: Diagnosis not present

## 2023-04-20 DIAGNOSIS — Z7409 Other reduced mobility: Secondary | ICD-10-CM | POA: Diagnosis not present

## 2023-04-20 DIAGNOSIS — N1832 Chronic kidney disease, stage 3b: Secondary | ICD-10-CM | POA: Diagnosis not present

## 2023-04-20 DIAGNOSIS — I872 Venous insufficiency (chronic) (peripheral): Secondary | ICD-10-CM | POA: Diagnosis not present

## 2023-04-20 DIAGNOSIS — M109 Gout, unspecified: Secondary | ICD-10-CM | POA: Diagnosis not present

## 2023-05-09 DIAGNOSIS — R600 Localized edema: Secondary | ICD-10-CM | POA: Diagnosis not present

## 2023-05-09 DIAGNOSIS — M109 Gout, unspecified: Secondary | ICD-10-CM | POA: Diagnosis not present

## 2023-05-09 DIAGNOSIS — I872 Venous insufficiency (chronic) (peripheral): Secondary | ICD-10-CM | POA: Diagnosis not present

## 2023-05-09 DIAGNOSIS — N1832 Chronic kidney disease, stage 3b: Secondary | ICD-10-CM | POA: Diagnosis not present

## 2023-05-09 DIAGNOSIS — Z7409 Other reduced mobility: Secondary | ICD-10-CM | POA: Diagnosis not present

## 2023-05-09 DIAGNOSIS — G8929 Other chronic pain: Secondary | ICD-10-CM | POA: Diagnosis not present

## 2023-05-09 DIAGNOSIS — I10 Essential (primary) hypertension: Secondary | ICD-10-CM | POA: Diagnosis not present

## 2023-05-09 DIAGNOSIS — M25569 Pain in unspecified knee: Secondary | ICD-10-CM | POA: Diagnosis not present

## 2023-06-07 DIAGNOSIS — D220 Melanocytic nevi of lip: Secondary | ICD-10-CM | POA: Diagnosis not present

## 2023-06-07 DIAGNOSIS — Z85828 Personal history of other malignant neoplasm of skin: Secondary | ICD-10-CM | POA: Diagnosis not present

## 2023-06-07 DIAGNOSIS — L570 Actinic keratosis: Secondary | ICD-10-CM | POA: Diagnosis not present

## 2023-06-07 DIAGNOSIS — D692 Other nonthrombocytopenic purpura: Secondary | ICD-10-CM | POA: Diagnosis not present

## 2023-07-19 ENCOUNTER — Ambulatory Visit (INDEPENDENT_AMBULATORY_CARE_PROVIDER_SITE_OTHER): Admitting: Podiatry

## 2023-07-19 DIAGNOSIS — Z91198 Patient's noncompliance with other medical treatment and regimen for other reason: Secondary | ICD-10-CM

## 2023-07-24 NOTE — Progress Notes (Signed)
 1. Failure to attend appointment with reason given    Patient rescheduled appointment.

## 2023-07-27 ENCOUNTER — Ambulatory Visit (INDEPENDENT_AMBULATORY_CARE_PROVIDER_SITE_OTHER): Admitting: Podiatry

## 2023-07-27 ENCOUNTER — Encounter: Payer: Self-pay | Admitting: Podiatry

## 2023-07-27 DIAGNOSIS — B351 Tinea unguium: Secondary | ICD-10-CM | POA: Diagnosis not present

## 2023-07-27 DIAGNOSIS — M79674 Pain in right toe(s): Secondary | ICD-10-CM

## 2023-07-27 DIAGNOSIS — M79675 Pain in left toe(s): Secondary | ICD-10-CM

## 2023-07-31 NOTE — Progress Notes (Unsigned)
  Subjective:  Patient ID: Christopher [name], male    DOB: January 19, 1928,  MRN: [id number]  Christopher [name] presents to clinic today for painful thick toenails that are difficult to trim. Pain interferes with ambulation. Aggravating factors include wearing enclosed shoe gear. Pain is relieved with periodic professional debridement. He is accompanied by his sister on today's visit. Chief Complaint  Patient presents with   [facility]    Rm16 Not diabetic/Dr. Charlott last visit April 2025   New problem(s): None.   PCP is Christopher [name] LABOR, MD.  Allergies  Allergen Reactions   Chicken Allergy Anaphylaxis    Other reaction(s): Unknown   Macrolides And Ketolides     Other Reaction(s): Other (See Comments)  Kidney pain   Other Anaphylaxis and Swelling    Chicken   Mirabegron     Other reaction(s): side pain   Penicillins Itching and Swelling   Poultry Meal Swelling    Review of Systems: Negative except as noted in the HPI.  Objective: No changes noted in today's physical examination. There were no vitals filed for this visit. Christopher [name] is a pleasant 89 y.o. male morbidly obese in NAD. AAO x 3.  Vascular Examination: Capillary refill time immediate b/l. Palpable pedal pulses. Pedal hair absent b/l. No pain with calf compression b/l. Skin temperature gradient WNL b/l. No cyanosis or clubbing b/l. No ischemia or gangrene noted b/l. Dependent rubor noted b/l lower extremities. Dependent edema noted b/l LE.  Neurological Examination: Sensation grossly intact b/l with 10 gram monofilament.   Dermatological Examination: Pedal skin with normal turgor, texture and tone b/l.  No open wounds. No interdigital macerations.   Toenails 1-5 b/l thick, discolored, elongated with subungual debris and pain on dorsal palpation.   {jgderm:23598}  Musculoskeletal Examination: {jgmsk:23600}  Radiographs: None  {jgxrayfindings:23683}  Last A1c:       No data to display           Assessment/Plan: 1. Pain due to onychomycosis of toenails of both feet     No orders of the defined types were placed in this encounter.   None {Jgplan:23602::-Patient/POA to call should there be question/concern in the interim.}   Return in about 3 months (around 10/27/2023).  Christopher [name], DPM      [place] LOCATION: [address] [place] [address]                   Office [phone]   [facility] LOCATION: [address] [place], [place] [address] Office [phone]

## 2023-09-05 DIAGNOSIS — L03116 Cellulitis of left lower limb: Secondary | ICD-10-CM | POA: Diagnosis not present

## 2023-09-08 DIAGNOSIS — Z79899 Other long term (current) drug therapy: Secondary | ICD-10-CM | POA: Diagnosis not present

## 2023-09-08 DIAGNOSIS — Z7409 Other reduced mobility: Secondary | ICD-10-CM | POA: Diagnosis not present

## 2023-09-08 DIAGNOSIS — I10 Essential (primary) hypertension: Secondary | ICD-10-CM | POA: Diagnosis not present

## 2023-09-08 DIAGNOSIS — N1832 Chronic kidney disease, stage 3b: Secondary | ICD-10-CM | POA: Diagnosis not present

## 2023-09-08 DIAGNOSIS — M109 Gout, unspecified: Secondary | ICD-10-CM | POA: Diagnosis not present

## 2023-09-08 DIAGNOSIS — R600 Localized edema: Secondary | ICD-10-CM | POA: Diagnosis not present

## 2023-09-08 DIAGNOSIS — G8929 Other chronic pain: Secondary | ICD-10-CM | POA: Diagnosis not present

## 2023-09-08 DIAGNOSIS — D692 Other nonthrombocytopenic purpura: Secondary | ICD-10-CM | POA: Diagnosis not present

## 2023-09-08 DIAGNOSIS — L0390 Cellulitis, unspecified: Secondary | ICD-10-CM | POA: Diagnosis not present

## 2023-09-08 DIAGNOSIS — Z0000 Encounter for general adult medical examination without abnormal findings: Secondary | ICD-10-CM | POA: Diagnosis not present

## 2023-09-08 DIAGNOSIS — I872 Venous insufficiency (chronic) (peripheral): Secondary | ICD-10-CM | POA: Diagnosis not present

## 2023-09-14 DIAGNOSIS — L0390 Cellulitis, unspecified: Secondary | ICD-10-CM | POA: Diagnosis not present

## 2023-09-14 DIAGNOSIS — I10 Essential (primary) hypertension: Secondary | ICD-10-CM | POA: Diagnosis not present

## 2023-09-14 DIAGNOSIS — N1832 Chronic kidney disease, stage 3b: Secondary | ICD-10-CM | POA: Diagnosis not present

## 2023-09-14 DIAGNOSIS — R609 Edema, unspecified: Secondary | ICD-10-CM | POA: Diagnosis not present

## 2023-09-14 DIAGNOSIS — Z7409 Other reduced mobility: Secondary | ICD-10-CM | POA: Diagnosis not present

## 2023-09-23 DIAGNOSIS — S81812A Laceration without foreign body, left lower leg, initial encounter: Secondary | ICD-10-CM | POA: Diagnosis not present

## 2023-09-23 DIAGNOSIS — R600 Localized edema: Secondary | ICD-10-CM | POA: Diagnosis not present

## 2023-09-25 DIAGNOSIS — S80922D Unspecified superficial injury of left lower leg, subsequent encounter: Secondary | ICD-10-CM | POA: Diagnosis not present

## 2023-09-25 DIAGNOSIS — T148XXD Other injury of unspecified body region, subsequent encounter: Secondary | ICD-10-CM | POA: Diagnosis not present

## 2023-09-28 DIAGNOSIS — T148XXD Other injury of unspecified body region, subsequent encounter: Secondary | ICD-10-CM | POA: Diagnosis not present

## 2023-09-28 DIAGNOSIS — S81802D Unspecified open wound, left lower leg, subsequent encounter: Secondary | ICD-10-CM | POA: Diagnosis not present

## 2023-10-01 ENCOUNTER — Encounter (HOSPITAL_BASED_OUTPATIENT_CLINIC_OR_DEPARTMENT_OTHER): Payer: Self-pay

## 2023-10-01 ENCOUNTER — Emergency Department (HOSPITAL_BASED_OUTPATIENT_CLINIC_OR_DEPARTMENT_OTHER): Admission: EM | Admit: 2023-10-01 | Discharge: 2023-10-01 | Disposition: A | Discharge status: Home / Self Care

## 2023-10-01 ENCOUNTER — Other Ambulatory Visit: Payer: Self-pay

## 2023-10-01 ENCOUNTER — Emergency Department (HOSPITAL_BASED_OUTPATIENT_CLINIC_OR_DEPARTMENT_OTHER)

## 2023-10-01 DIAGNOSIS — R600 Localized edema: Secondary | ICD-10-CM | POA: Diagnosis not present

## 2023-10-01 DIAGNOSIS — I493 Ventricular premature depolarization: Secondary | ICD-10-CM | POA: Insufficient documentation

## 2023-10-01 DIAGNOSIS — Z8546 Personal history of malignant neoplasm of prostate: Secondary | ICD-10-CM | POA: Insufficient documentation

## 2023-10-01 DIAGNOSIS — N189 Chronic kidney disease, unspecified: Secondary | ICD-10-CM | POA: Insufficient documentation

## 2023-10-01 DIAGNOSIS — R001 Bradycardia, unspecified: Secondary | ICD-10-CM | POA: Diagnosis not present

## 2023-10-01 DIAGNOSIS — R002 Palpitations: Secondary | ICD-10-CM | POA: Diagnosis not present

## 2023-10-01 LAB — CBC
HCT: 39.8 % (ref 39.0–52.0)
Hemoglobin: 13.3 g/dL (ref 13.0–17.0)
MCH: 30.3 pg (ref 26.0–34.0)
MCHC: 33.4 g/dL (ref 30.0–36.0)
MCV: 90.7 fL (ref 80.0–100.0)
Platelets: 229 K/uL (ref 150–400)
RBC: 4.39 MIL/uL (ref 4.22–5.81)
RDW: 13 % (ref 11.5–15.5)
WBC: 8.4 K/uL (ref 4.0–10.5)
nRBC: 0 % (ref 0.0–0.2)

## 2023-10-01 LAB — BASIC METABOLIC PANEL WITH GFR
Anion gap: 12 (ref 5–15)
BUN: 38 mg/dL — ABNORMAL HIGH (ref 8–23)
CO2: 26 mmol/L (ref 22–32)
Calcium: 9.8 mg/dL (ref 8.9–10.3)
Chloride: 101 mmol/L (ref 98–111)
Creatinine, Ser: 1.88 mg/dL — ABNORMAL HIGH (ref 0.61–1.24)
GFR, Estimated: 33 mL/min — ABNORMAL LOW (ref >60)
Glucose, Bld: 93 mg/dL (ref 70–99)
Potassium: 3.5 mmol/L (ref 3.5–5.1)
Sodium: 139 mmol/L (ref 135–145)

## 2023-10-01 LAB — TROPONIN T, HIGH SENSITIVITY
Troponin T High Sensitivity: 44 ng/L — ABNORMAL HIGH (ref 0–19)
Troponin T High Sensitivity: 43 ng/L — ABNORMAL HIGH (ref 0–19)

## 2023-10-01 LAB — MAGNESIUM: Magnesium: 1.7 mg/dL (ref 1.7–2.4)

## 2023-10-01 NOTE — ED Provider Notes (Signed)
 [place] EMERGENCY DEPARTMENT AT [facility] Provider Note   CSN: [id number] Arrival date & time: 10/01/23  1046     Patient presents with: Palpitations   Christopher [name] is a 89 y.o. male.   89 year old male presented the emergency department for evaluation of an abnormal EKG.  He was getting wound care to his left lower extremity when noted to have somewhat abnormal heart rate.  Patient reports that he feels his normal self not having lightheadedness, dizziness, chest pain, shortness of breath.  No recent infections.  No new medications.  Denies cardiac history or disease.  Has not seen a cardiologist   Palpitations      Prior to Admission medications   Medication Sig Start Date End Date Taking? Authorizing Provider  acetaminophen  (TYLENOL ) 325 MG tablet 1 tablet as needed    [provider]  cephALEXin (KEFLEX) 500 MG capsule Take 500 mg by mouth 3 (three) times daily. 03/11/20   [provider]  clotrimazole -betamethasone  (LOTRISONE ) cream Apply topically 2 (two) times daily as needed (itching).     [provider]  furosemide  (LASIX ) 40 MG tablet 1 tablet 05/13/20   [provider]  ketoconazole (NIZORAL) 2 % cream  11/20/15   [provider]  mirabegron ER (MYRBETRIQ) 25 MG TB24 tablet 1 tablet 11/17/20   [provider]  OVER THE COUNTER MEDICATION daily. Swedish bitters, 2 tsp    [provider]  triamcinolone cream (KENALOG) 0.5 % MIX 1 APPLICATION WITH 1/2 GOLD BOND AND APPLY EXTERNALLY ONCE A DAY FOR 1 DAY    [provider]  triamterene -hydrochlorothiazide  (MAXZIDE ) 75-50 MG tablet 1 tablet in the morning    [provider]    Allergies: Chicken allergy, Macrolides and ketolides, Other, Mirabegron, Penicillins, and Poultry meal    Review of Systems  Cardiovascular:  Positive for palpitations.    Updated Vital Signs BP 126/76   Pulse 66   Temp 97.8 F (36.6 C)   Resp 12   Ht 5'  8 (1.727 m)   Wt 117.5 kg   SpO2 99%   BMI 39.38 kg/m   Physical Exam Vitals and nursing note reviewed.  Constitutional:      General: He is not in acute distress.    Appearance: He is obese. He is not toxic-appearing.  Eyes:     Conjunctiva/sclera: Conjunctivae normal.  Cardiovascular:     Rate and Rhythm: Normal rate and regular rhythm.  Pulmonary:     Effort: Pulmonary effort is normal.     Breath sounds: Normal breath sounds.  Abdominal:     General: Abdomen is flat. There is no distension.     Tenderness: There is no abdominal tenderness. There is no guarding or rebound.  Musculoskeletal:     Right lower leg: Edema present.     Left lower leg: Edema present.  Skin:    General: Skin is warm and dry.     Capillary Refill: Capillary refill takes less than 2 seconds.  Neurological:     General: No focal deficit present.     Mental Status: He is alert and oriented to person, place, and time.  Psychiatric:        Mood and Affect: Mood normal.        Behavior: Behavior normal.     (all labs ordered are listed, but only abnormal results are displayed) Labs Reviewed  BASIC METABOLIC PANEL WITH GFR - Abnormal; Notable for the following components:  Result Value   BUN 38 (*)    Creatinine, Ser 1.88 (*)    GFR, Estimated 33 (*)    All other components within normal limits  TROPONIN T, HIGH SENSITIVITY - Abnormal; Notable for the following components:   Troponin T High Sensitivity 44 (*)    All other components within normal limits  TROPONIN T, HIGH SENSITIVITY - Abnormal; Notable for the following components:   Troponin T High Sensitivity 43 (*)    All other components within normal limits  CBC  MAGNESIUM     EKG: EKG Interpretation Date/Time:  Saturday October 01 2023 10:53:18 EDT Ventricular Rate:  86 PR Interval:  168 QRS Duration:  98 QT Interval:  394 QTC Calculation: 471 R Axis:   -1  Text Interpretation: Sinus rhythm with frequent and consecutive  Premature ventricular complexes Incomplete right bundle branch block T wave abnormality, consider inferior ischemia Abnormal ECG When compared with ECG of 26-Nov-2014 06:31, Premature ventricular complexes are now Present Incomplete right bundle branch block is now Present Confirmed by Neysa Clap [phone]) on 10/01/2023 11:07:11 AM  Radiology: [facility] Chest Port 1 View Result Date: 10/01/2023 CLINICAL DATA:  Palpitations. EXAM: PORTABLE CHEST 1 VIEW COMPARISON:  02/22/2017 FINDINGS: Low volume film. Cardiopericardial silhouette is at upper limits of normal for size. The lungs are clear without focal pneumonia, edema, pneumothorax or pleural effusion. No acute bony abnormality. Telemetry leads overlie the chest. IMPRESSION: Low volume film without acute cardiopulmonary findings. Electronically Signed   By: Camellia Candle M.D.   On: 10/01/2023 11:47     Procedures   Medications Ordered in the ED - No data to display  Clinical Course as of 10/01/23 1542  Sat Oct 01, 2023  1523 Patient's workup today largely reassuring.  CBC with no leukocytosis or anemia.  Basic metabolic panel with no electrolyte abnormalities.  Renal function appears similar to baseline, and does have CKD.  Magnesium  level is normal.  Initial troponin of 44, awaiting repeat troponin.  If normal and still asymptomatic discharged with cardiology follow-up. [TY]  1541 Troponin T High Sensitivity(!): 43 Reassuring.  Patient PVC burden is decreased on the monitor as independently reviewed by me.  Does not appear to be in bigeminy as noted on ECG.  Patient remains asymptomatic.  Will discharge in stable condition to follow-up with cardiology [TY]    Clinical Course User Index [TY] Neysa Clap PARAS, DO                                 Medical Decision Making This is a 89 year old male presenting emergency department with concern for an abnormal EKG at his primary doctors.  He is afebrile nontachycardic, slightly hypertensive here.  EKG  appears to have frequent PVCs.  No QTc prolongation.  No ST segment changes to indicate ischemia on my independent interpretation.  He is asymptomatic currently, no palpitations, no chest pain, no shortness of breath, no lightheadedness.  Will get screening labs to evaluate for electrolyte abnormalities.  See ED course for further MDM disposition.  Amount and/or Complexity of Data Reviewed Independent Historian:     Details: Sister notes that he takes doxycycline for cellulitis to his left leg, and steroid External Data Reviewed:     Details: Does have a history of CKD, prostate cancer and UTI.  Also does appear that he has PVC documented on some old notes, so this may not be a new phenomenon for him.  Labs: ordered. Decision-making details documented in ED Course.    Details: See ED course Radiology: ordered and independent interpretation performed.    Details: Do not appreciate pneumothorax or overt pneumonia ECG/medicine tests: independent interpretation performed.    Details: See above  Risk Decision regarding hospitalization. Diagnosis or treatment significantly limited by social determinants of health. Risk Details: Advanced age       Final diagnoses:  PVC (premature ventricular contraction)    ED Discharge Orders          Ordered    Ambulatory referral to Cardiology       Comments: If you have not heard from the Cardiology office within the next 72 hours please call [phone].   10/01/23 1522               Neysa Caron PARAS, DO 10/01/23 1541

## 2023-10-01 NOTE — Discharge Instructions (Addendum)
 Please follow-up with cardiology and your primary doctor.  I have put in a referral to cardiology, they should call you to schedule an appointment.  If they do not call, please call yourself.  Return if develop lightheadedness, dizziness, chest pain, shortness of breath, abdominal pain, nausea vomiting or any new or worsening symptoms that are concerning to you.

## 2023-10-01 NOTE — ED Triage Notes (Signed)
 Patient was sent from doctors office for an abnormal EKG. Denies any pain or fluttering. Went to the doctor to have his leg wrapped for a wound on it when the doctor noticed the EKG.

## 2023-10-01 NOTE — ED Notes (Addendum)
 Pt alert, NAD, calm, interactive, resps e/u, speaking in clear complete sentences. VSS. NSR with PVCs. Denies pain or other sx. Wife at [facility].

## 2023-10-03 ENCOUNTER — Emergency Department (HOSPITAL_BASED_OUTPATIENT_CLINIC_OR_DEPARTMENT_OTHER)

## 2023-10-03 ENCOUNTER — Other Ambulatory Visit: Payer: Self-pay

## 2023-10-03 ENCOUNTER — Inpatient Hospital Stay (HOSPITAL_BASED_OUTPATIENT_CLINIC_OR_DEPARTMENT_OTHER)
Admission: EM | Admit: 2023-10-03 | Discharge: 2023-10-06 | DRG: 602 | Disposition: A | Discharge status: Home / Self Care | Source: Ambulatory Visit | Attending: Internal Medicine | Admitting: Internal Medicine

## 2023-10-03 ENCOUNTER — Encounter (HOSPITAL_BASED_OUTPATIENT_CLINIC_OR_DEPARTMENT_OTHER): Payer: Self-pay

## 2023-10-03 DIAGNOSIS — I517 Cardiomegaly: Secondary | ICD-10-CM | POA: Diagnosis not present

## 2023-10-03 DIAGNOSIS — Z860100 Personal history of colon polyps, unspecified: Secondary | ICD-10-CM | POA: Diagnosis not present

## 2023-10-03 DIAGNOSIS — I8393 Asymptomatic varicose veins of bilateral lower extremities: Secondary | ICD-10-CM | POA: Diagnosis present

## 2023-10-03 DIAGNOSIS — E876 Hypokalemia: Secondary | ICD-10-CM | POA: Diagnosis not present

## 2023-10-03 DIAGNOSIS — I700 Atherosclerosis of aorta: Secondary | ICD-10-CM | POA: Diagnosis not present

## 2023-10-03 DIAGNOSIS — L03116 Cellulitis of left lower limb: Principal | ICD-10-CM | POA: Diagnosis present

## 2023-10-03 DIAGNOSIS — Z87891 Personal history of nicotine dependence: Secondary | ICD-10-CM

## 2023-10-03 DIAGNOSIS — E8770 Fluid overload, unspecified: Secondary | ICD-10-CM | POA: Diagnosis not present

## 2023-10-03 DIAGNOSIS — R600 Localized edema: Secondary | ICD-10-CM | POA: Diagnosis not present

## 2023-10-03 DIAGNOSIS — C61 Malignant neoplasm of prostate: Secondary | ICD-10-CM | POA: Diagnosis present

## 2023-10-03 DIAGNOSIS — I130 Hypertensive heart and chronic kidney disease with heart failure and stage 1 through stage 4 chronic kidney disease, or unspecified chronic kidney disease: Secondary | ICD-10-CM | POA: Diagnosis present

## 2023-10-03 DIAGNOSIS — Z6839 Body mass index (BMI) 39.0-39.9, adult: Secondary | ICD-10-CM | POA: Diagnosis not present

## 2023-10-03 DIAGNOSIS — D692 Other nonthrombocytopenic purpura: Secondary | ICD-10-CM | POA: Diagnosis present

## 2023-10-03 DIAGNOSIS — T501X5A Adverse effect of loop [high-ceiling] diuretics, initial encounter: Secondary | ICD-10-CM | POA: Diagnosis present

## 2023-10-03 DIAGNOSIS — K5730 Diverticulosis of large intestine without perforation or abscess without bleeding: Secondary | ICD-10-CM | POA: Diagnosis present

## 2023-10-03 DIAGNOSIS — I739 Peripheral vascular disease, unspecified: Secondary | ICD-10-CM | POA: Diagnosis present

## 2023-10-03 DIAGNOSIS — I5041 Acute combined systolic (congestive) and diastolic (congestive) heart failure: Secondary | ICD-10-CM | POA: Diagnosis present

## 2023-10-03 DIAGNOSIS — I493 Ventricular premature depolarization: Secondary | ICD-10-CM | POA: Diagnosis present

## 2023-10-03 DIAGNOSIS — L03119 Cellulitis of unspecified part of limb: Secondary | ICD-10-CM | POA: Diagnosis not present

## 2023-10-03 DIAGNOSIS — N183 Chronic kidney disease, stage 3 unspecified: Secondary | ICD-10-CM | POA: Diagnosis present

## 2023-10-03 DIAGNOSIS — M109 Gout, unspecified: Secondary | ICD-10-CM | POA: Diagnosis present

## 2023-10-03 DIAGNOSIS — Z96641 Presence of right artificial hip joint: Secondary | ICD-10-CM | POA: Diagnosis present

## 2023-10-03 DIAGNOSIS — L309 Dermatitis, unspecified: Secondary | ICD-10-CM | POA: Diagnosis present

## 2023-10-03 DIAGNOSIS — I350 Nonrheumatic aortic (valve) stenosis: Secondary | ICD-10-CM | POA: Diagnosis not present

## 2023-10-03 DIAGNOSIS — I509 Heart failure, unspecified: Secondary | ICD-10-CM | POA: Diagnosis present

## 2023-10-03 DIAGNOSIS — N179 Acute kidney failure, unspecified: Secondary | ICD-10-CM | POA: Diagnosis present

## 2023-10-03 DIAGNOSIS — I878 Other specified disorders of veins: Secondary | ICD-10-CM | POA: Diagnosis present

## 2023-10-03 DIAGNOSIS — Z923 Personal history of irradiation: Secondary | ICD-10-CM

## 2023-10-03 DIAGNOSIS — R7989 Other specified abnormal findings of blood chemistry: Secondary | ICD-10-CM | POA: Diagnosis not present

## 2023-10-03 DIAGNOSIS — Z91018 Allergy to other foods: Secondary | ICD-10-CM

## 2023-10-03 DIAGNOSIS — L0390 Cellulitis, unspecified: Secondary | ICD-10-CM | POA: Diagnosis present

## 2023-10-03 DIAGNOSIS — I272 Pulmonary hypertension, unspecified: Secondary | ICD-10-CM | POA: Diagnosis present

## 2023-10-03 DIAGNOSIS — I872 Venous insufficiency (chronic) (peripheral): Secondary | ICD-10-CM | POA: Diagnosis present

## 2023-10-03 DIAGNOSIS — Z79899 Other long term (current) drug therapy: Secondary | ICD-10-CM

## 2023-10-03 DIAGNOSIS — Z7401 Bed confinement status: Secondary | ICD-10-CM | POA: Diagnosis not present

## 2023-10-03 DIAGNOSIS — Z888 Allergy status to other drugs, medicaments and biological substances status: Secondary | ICD-10-CM

## 2023-10-03 DIAGNOSIS — M159 Polyosteoarthritis, unspecified: Secondary | ICD-10-CM | POA: Diagnosis present

## 2023-10-03 DIAGNOSIS — M7989 Other specified soft tissue disorders: Secondary | ICD-10-CM | POA: Diagnosis present

## 2023-10-03 DIAGNOSIS — Z8546 Personal history of malignant neoplasm of prostate: Secondary | ICD-10-CM

## 2023-10-03 DIAGNOSIS — Z7952 Long term (current) use of systemic steroids: Secondary | ICD-10-CM

## 2023-10-03 DIAGNOSIS — E875 Hyperkalemia: Secondary | ICD-10-CM | POA: Diagnosis not present

## 2023-10-03 DIAGNOSIS — R609 Edema, unspecified: Secondary | ICD-10-CM | POA: Diagnosis not present

## 2023-10-03 DIAGNOSIS — E66812 Obesity, class 2: Secondary | ICD-10-CM | POA: Diagnosis present

## 2023-10-03 DIAGNOSIS — R9431 Abnormal electrocardiogram [ECG] [EKG]: Secondary | ICD-10-CM | POA: Diagnosis not present

## 2023-10-03 DIAGNOSIS — N1831 Chronic kidney disease, stage 3a: Secondary | ICD-10-CM | POA: Diagnosis not present

## 2023-10-03 DIAGNOSIS — Z841 Family history of disorders of kidney and ureter: Secondary | ICD-10-CM

## 2023-10-03 DIAGNOSIS — Z880 Allergy status to penicillin: Secondary | ICD-10-CM

## 2023-10-03 DIAGNOSIS — I771 Stricture of artery: Secondary | ICD-10-CM | POA: Diagnosis not present

## 2023-10-03 LAB — CBC WITH DIFFERENTIAL/PLATELET
Abs Immature Granulocytes: 0.04 K/uL (ref 0.00–0.07)
Basophils Absolute: 0 K/uL (ref 0.0–0.1)
Basophils Relative: 0 %
Eosinophils Absolute: 0.1 K/uL (ref 0.0–0.5)
Eosinophils Relative: 2 %
HCT: 41.1 % (ref 39.0–52.0)
Hemoglobin: 13.8 g/dL (ref 13.0–17.0)
Immature Granulocytes: 1 %
Lymphocytes Relative: 23 %
Lymphs Abs: 1.8 K/uL (ref 0.7–4.0)
MCH: 30.4 pg (ref 26.0–34.0)
MCHC: 33.6 g/dL (ref 30.0–36.0)
MCV: 90.5 fL (ref 80.0–100.0)
Monocytes Absolute: 0.8 K/uL (ref 0.1–1.0)
Monocytes Relative: 10 %
Neutro Abs: 5.3 K/uL (ref 1.7–7.7)
Neutrophils Relative %: 64 %
Platelets: 228 K/uL (ref 150–400)
RBC: 4.54 MIL/uL (ref 4.22–5.81)
RDW: 13 % (ref 11.5–15.5)
WBC: 8.2 K/uL (ref 4.0–10.5)
nRBC: 0 % (ref 0.0–0.2)

## 2023-10-03 LAB — BASIC METABOLIC PANEL WITH GFR
Anion gap: 15 (ref 5–15)
BUN: 33 mg/dL — ABNORMAL HIGH (ref 8–23)
CO2: 21 mmol/L — ABNORMAL LOW (ref 22–32)
Calcium: 10 mg/dL (ref 8.9–10.3)
Chloride: 101 mmol/L (ref 98–111)
Creatinine, Ser: 1.95 mg/dL — ABNORMAL HIGH (ref 0.61–1.24)
GFR, Estimated: 32 mL/min — ABNORMAL LOW (ref >60)
Glucose, Bld: 102 mg/dL — ABNORMAL HIGH (ref 70–99)
Potassium: 3.4 mmol/L — ABNORMAL LOW (ref 3.5–5.1)
Sodium: 137 mmol/L (ref 135–145)

## 2023-10-03 LAB — MAGNESIUM: Magnesium: 1.6 mg/dL — ABNORMAL LOW (ref 1.7–2.4)

## 2023-10-03 LAB — TROPONIN T, HIGH SENSITIVITY: Troponin T High Sensitivity: 47 ng/L — ABNORMAL HIGH (ref 0–19)

## 2023-10-03 LAB — PHOSPHORUS: Phosphorus: 3.3 mg/dL (ref 2.5–4.6)

## 2023-10-03 LAB — PRO BRAIN NATRIURETIC PEPTIDE: Pro Brain Natriuretic Peptide: 1698 pg/mL — ABNORMAL HIGH (ref <300.0)

## 2023-10-03 MED ORDER — TRIAMTERENE-HCTZ 75-50 MG PO TABS
1.0000 | ORAL_TABLET | Freq: Every day | ORAL | Status: DC
  Administered 2023-10-04 – 2023-10-05 (×2): 1 via ORAL
  Filled 2023-10-03 (×2): qty 1

## 2023-10-03 MED ORDER — ACETAMINOPHEN 650 MG RE SUPP: 650.0000 mg | Freq: Four times a day (QID) | RECTAL | Status: DC | PRN

## 2023-10-03 MED ORDER — FUROSEMIDE 10 MG/ML IJ SOLN
40.0000 mg | Freq: Once | INTRAMUSCULAR | Status: AC
  Administered 2023-10-03: 40 mg via INTRAVENOUS
  Filled 2023-10-03: qty 4

## 2023-10-03 MED ORDER — ACETAMINOPHEN 325 MG PO TABS
650.0000 mg | ORAL_TABLET | Freq: Four times a day (QID) | ORAL | Status: DC | PRN
  Filled 2023-10-03: qty 2

## 2023-10-03 MED ORDER — ENOXAPARIN SODIUM 40 MG/0.4ML IJ SOSY
40.0000 mg | PREFILLED_SYRINGE | INTRAMUSCULAR | Status: DC
Start: 2023-10-03 — End: 2023-10-06
  Administered 2023-10-03 – 2023-10-05 (×3): 40 mg via SUBCUTANEOUS
  Filled 2023-10-03 (×3): qty 0.4

## 2023-10-03 MED ORDER — PREDNISONE 5 MG PO TABS
5.0000 mg | ORAL_TABLET | Freq: Every day | ORAL | Status: DC
  Administered 2023-10-04 – 2023-10-06 (×3): 5 mg via ORAL
  Filled 2023-10-03 (×3): qty 1

## 2023-10-03 MED ORDER — SODIUM CHLORIDE 0.9 % IV SOLN
1.0000 g | INTRAVENOUS | Status: DC
  Administered 2023-10-04 – 2023-10-06 (×3): 1 g via INTRAVENOUS
  Filled 2023-10-03 (×3): qty 10

## 2023-10-03 MED ORDER — ALLOPURINOL 100 MG PO TABS
100.0000 mg | ORAL_TABLET | Freq: Every day | ORAL | Status: DC
  Administered 2023-10-04 – 2023-10-06 (×3): 100 mg via ORAL
  Filled 2023-10-03 (×3): qty 1

## 2023-10-03 MED ORDER — POTASSIUM CHLORIDE 20 MEQ PO PACK
20.0000 meq | PACK | Freq: Once | ORAL | Status: AC
  Administered 2023-10-03: 20 meq via ORAL
  Filled 2023-10-03: qty 1

## 2023-10-03 MED ORDER — MAGNESIUM SULFATE 50 % IJ SOLN
2.0000 g | Freq: Once | INTRAMUSCULAR | Status: AC
  Administered 2023-10-03: 2 g via INTRAVENOUS
  Filled 2023-10-03: qty 4

## 2023-10-03 MED ORDER — SODIUM CHLORIDE 0.9 % IV SOLN
2.0000 g | Freq: Once | INTRAVENOUS | Status: AC
  Administered 2023-10-03: 2 g via INTRAVENOUS
  Filled 2023-10-03: qty 20

## 2023-10-03 NOTE — ED Notes (Signed)
 Thomas with cl called for transport

## 2023-10-03 NOTE — H&P (Signed)
 History and Physical    Christopher [name] FMW:[id number] DOB: [birth year] DOA: 10/03/2023  Patient coming from: Home.  Chief Complaint: Worsening lower extremity.  HPI: Claudie [name] is a 89 y.o. male with history of chronic lower extremity edema which patient attributes to after receiving radiation to the prostate in 2005 on triamterene  hydrochlorothiazide  chronically with history of chronic kidney disease stage III had a wound on his left leg after his leg hit on the car door about 2 weeks ago.  The wound has progressively got worse with some discharge and had been following with his primary care physician.  As per the patient and his sister primary care physician had prescribed antibiotic which contained penicillin and patient started developing swelling in his lips and ulceration on his tongue after taking 2 doses and the medication was immediately discontinued.  The patient and patient's sister is not able to recall the medications name but will be trying to bring it in the morning.  Following which he was not on any antibiotic.  His left foot was dressed following which his swelling started worsening and had come to the ER.  He also had come to the ER on October 01 2023 for concern for palpitation and was observed in the ER with PVCs and discharged with follow-up with cardiology.  Patient denies any shortness of breath chest pain productive cough.  ED Course: In the ER patient's left lower extremity was found to be swollen Dopplers were negative but did show swelling.  Dressing was released and swelling improved.  proBNP was 1600 troponins of 47.  Creatinine 1.9 potassium 3.4.  Patient was given Lasix  40 mg IV with concern for new onset CHF and also since there was concern for wound infection was started on ceftriaxone  admitted for further workup.  My exam patient is not in distress  Review of Systems: As per HPI, rest all negative.   Past Medical History:  Diagnosis Date   Bruises easily    both  hands   Cancer (HCC) 2005   prostate cancer treated with external beam radiation and radioactive seeds   Cataracts, bilateral    immature   Chronic kidney disease    Complication of anesthesia    states he was given too much anesthesia during his knee replacement   Constipation    takes Colace daily as needed   Decreased hearing    Diverticulitis 2003   DJD (degenerative joint disease)    thumbs and knees   History of colon polyps    benign   History of gout    Joint pain    Joint swelling    Leg cramps    no meds   Peripheral edema    takes Maxzide  daily   Pneumonia    hx of-15+yrs ago   Ringing in ears    UTI (lower urinary tract infection)    just completed antibiotic on 11/14/14    Past Surgical History:  Procedure Laterality Date   APPENDECTOMY  1941   CATARACT EXTRACTION W/PHACO Right 10/05/2017   Procedure: CATARACT EXTRACTION PHACO AND INTRAOCULAR LENS PLACEMENT (IOC) RIGHT;  Surgeon: Mittie Gaskin, MD;  Location: [facility];  Service: Ophthalmology;  Laterality: Right;  requests arrival time to be after 10am   CATARACT EXTRACTION W/PHACO Left 10/26/2017   Procedure: CATARACT EXTRACTION PHACO AND INTRAOCULAR LENS PLACEMENT (IOC) LEFT;  Surgeon: Mittie Gaskin, MD;  Location: [facility];  Service: Ophthalmology;  Laterality: Left;   COLONOSCOPY     ENDOVENOUS  ABLATION SAPHENOUS VEIN W/ LASER  12-01-2011   left greater saphenous vein by Krystal Doing MD    ENDOVENOUS ABLATION SAPHENOUS VEIN W/ LASER  12-22-2011   right greater saphenous vein    by Krystal Doing MD   HAND SURGERY     HEMORRHOID SURGERY  1970   PARTIAL COLECTOMY     prostate seeds      removal of chest wall tumor     sigmoid colon resection     TOTAL HIP ARTHROPLASTY Right 11/26/2014   Procedure: TOTAL HIP ARTHROPLASTY;  Surgeon: Maude LELON Right, MD;  Location: [facility] OR;  Service: Orthopedics;  Laterality: Right;   TOTAL KNEE ARTHROPLASTY  08-2010     reports that he quit  smoking about 67 years ago. His smoking use included cigarettes. He started smoking about 79 years ago. He has a 12 pack-year smoking history. He has never used smokeless tobacco. He reports that he does not drink alcohol and does not use drugs.  Allergies  Allergen Reactions   Chicken Allergy Anaphylaxis    Other reaction(s): Unknown   Macrolides And Ketolides     Other Reaction(s): Other (See Comments)  Kidney pain   Other Anaphylaxis and Swelling    Chicken   Mirabegron     Other reaction(s): side pain   Penicillins Itching and Swelling   Poultry Meal Swelling    Family History  Problem Relation Age of Onset   Kidney disease Mother     Prior to Admission medications   Medication Sig Start Date End Date Taking? Authorizing Provider  acetaminophen  (TYLENOL ) 325 MG tablet 1 tablet as needed    [provider]  cephALEXin (KEFLEX) 500 MG capsule Take 500 mg by mouth 3 (three) times daily. 03/11/20   [provider]  clotrimazole -betamethasone  (LOTRISONE ) cream Apply topically 2 (two) times daily as needed (itching).     [provider]  furosemide  (LASIX ) 40 MG tablet 1 tablet 05/13/20   [provider]  ketoconazole (NIZORAL) 2 % cream  11/20/15   [provider]  mirabegron ER (MYRBETRIQ) 25 MG TB24 tablet 1 tablet 11/17/20   [provider]  OVER THE COUNTER MEDICATION daily. Swedish bitters, 2 tsp    [provider]  triamcinolone cream (KENALOG) 0.5 % MIX 1 APPLICATION WITH 1/2 GOLD BOND AND APPLY EXTERNALLY ONCE A DAY FOR 1 DAY    [provider]  triamterene -hydrochlorothiazide  (MAXZIDE ) 75-50 MG tablet 1 tablet in the morning    [provider]    Physical Exam: Constitutional: Moderately built and nourished. Vitals:   10/03/23 1415 10/03/23 1700 10/03/23 1815 10/03/23 1939  BP: 113/86 114/67 127/69 135/68  Pulse: 75 (!) 57 60 62  Resp: (!) 25 20 18 19   Temp: 97.6 F (36.4 C)  97.9 F (36.6  C) 97.8 F (36.6 C)  TempSrc: Oral  Oral Oral  SpO2: 95% 99% 98% 97%  Weight:      Height:       Eyes: Anicteric no pallor. ENMT: No discharge from the ears eyes nose or mouth. Neck: No mass felt.  No neck rigidity. Respiratory: No rhonchi or crepitations. Cardiovascular: S1-S2 heard. Abdomen: Soft nontender bowel sound present. Musculoskeletal: Bilateral lower extremity edema present. Skin: Erythema of the lower extremities and there is a wound on the left anterior shin. Neurologic: Alert awake oriented to time place and person.  Moves all extremities. Psychiatric: Appears normal.  Normal affect.   Labs on Admission: I have personally reviewed  following labs and imaging studies  CBC: Recent Labs  Lab 10/01/23 1234 10/03/23 1139  WBC 8.4 8.2  NEUTROABS  --  5.3  HGB 13.3 13.8  HCT 39.8 41.1  MCV 90.7 90.5  PLT 229 228   Basic Metabolic Panel: Recent Labs  Lab 10/01/23 1234 10/03/23 1139  NA 139 137  K 3.5 3.4*  CL 101 101  CO2 26 21*  GLUCOSE 93 102*  BUN 38* 33*  CREATININE 1.88* 1.95*  CALCIUM 9.8 10.0  MG 1.7 1.6*  PHOS  --  3.3   GFR: Estimated Creatinine Clearance: 30.1 mL/min (A) (by C-G formula based on SCr of 1.95 mg/dL (H)). Liver Function Tests: No results for input(s): AST, ALT, ALKPHOS, BILITOT, PROT, ALBUMIN  in the last 168 hours. No results for input(s): LIPASE, AMYLASE in the last 168 hours. No results for input(s): AMMONIA in the last 168 hours. Coagulation Profile: No results for input(s): INR, PROTIME in the last 168 hours. Cardiac Enzymes: No results for input(s): CKTOTAL, CKMB, CKMBINDEX, TROPONINI in the last 168 hours. BNP (last 3 results) Recent Labs    10/03/23 1139  PROBNP 1,698.0*   HbA1C: No results for input(s): HGBA1C in the last 72 hours. CBG: No results for input(s): GLUCAP in the last 168 hours. Lipid Profile: No results for input(s): CHOL, HDL, LDLCALC, TRIG, CHOLHDL,  LDLDIRECT in the last 72 hours. Thyroid  Function Tests: No results for input(s): TSH, T4TOTAL, FREET4, T3FREE, THYROIDAB in the last 72 hours. Anemia Panel: No results for input(s): VITAMINB12, FOLATE, FERRITIN, TIBC, IRON, RETICCTPCT in the last 72 hours. Urine analysis:    Component Value Date/Time   COLORURINE YELLOW 11/15/2014 0945   APPEARANCEUR CLEAR 11/15/2014 0945   LABSPEC 1.016 11/15/2014 0945   PHURINE 5.0 11/15/2014 0945   GLUCOSEU NEGATIVE 11/15/2014 0945   HGBUR NEGATIVE 11/15/2014 0945   BILIRUBINUR NEGATIVE 11/15/2014 0945   KETONESUR NEGATIVE 11/15/2014 0945   PROTEINUR NEGATIVE 11/15/2014 0945   UROBILINOGEN 0.2 11/15/2014 0945   NITRITE NEGATIVE 11/15/2014 0945   LEUKOCYTESUR NEGATIVE 11/15/2014 0945   Sepsis Labs: @LABRCNTIP (procalcitonin:4,lacticidven:4) )No results found for this or any previous visit (from the past 240 hours).   Radiological Exams on Admission: DG Chest Portable 1 View Result Date: 10/03/2023 CLINICAL DATA:  volume overload EXAM: PORTABLE CHEST - 1 VIEW COMPARISON:  10/01/2023 FINDINGS: Low lung volumes. No focal airspace consolidation, pleural effusion, or pneumothorax. Mild cardiomegaly. Tortuous aorta with aortic atherosclerosis. No acute fracture or destructive lesions. Multilevel thoracic osteophytosis. Surgical clips in the left upper quadrant. IMPRESSION: No acute cardiopulmonary abnormality. Electronically Signed   By: Rogelia Myers M.D.   On: 10/03/2023 13:20   US  Venous Img Lower Unilateral Left Result Date: 10/03/2023 CLINICAL DATA:  Left lower extremity with weeping EXAM: LEFT LOWER EXTREMITY VENOUS DOPPLER ULTRASOUND TECHNIQUE: Gray-scale sonography with graded compression, as well as color Doppler and duplex ultrasound were performed to evaluate the lower extremity deep venous systems from the level of the common femoral vein and including the common femoral, femoral, profunda femoral, popliteal and calf  veins including the posterior tibial, peroneal and gastrocnemius veins when visible. The superficial great saphenous vein was also interrogated. Spectral Doppler was utilized to evaluate flow at rest and with distal augmentation maneuvers in the common femoral, femoral and popliteal veins. COMPARISON:  None Available. FINDINGS: Contralateral Common Femoral Vein: Respiratory phasicity is normal and symmetric with the symptomatic side. No evidence of thrombus. Normal compressibility. Common Femoral Vein: No evidence of thrombus. Normal compressibility, respiratory phasicity  and response to augmentation. Saphenofemoral Junction: No evidence of thrombus. Normal compressibility and flow on color Doppler imaging. Profunda Femoral Vein: No evidence of thrombus. Normal compressibility and flow on color Doppler imaging. Femoral Vein: No evidence of thrombus. Normal compressibility, respiratory phasicity and response to augmentation. Popliteal Vein: No evidence of thrombus. Normal compressibility, respiratory phasicity and response to augmentation. Calf Veins: No evidence of thrombus. Normal compressibility and flow on color Doppler imaging. Superficial Great Saphenous Vein: No evidence of thrombus. Normal compressibility. Venous Reflux:  None. Other Findings:  Positive for superficial subcutaneous edema. IMPRESSION: No evidence of deep venous thrombosis. Positive for superficial subcutaneous edema which may be related to volume overload. Electronically Signed   By: Wilkie Lent M.D.   On: 10/03/2023 12:28    EKG: Independently reviewed.  Normal sinus rhythm.  LVH.  Assessment/Plan Principal Problem:   Swelling of lower extremity Active Problems:   Cancer of prostate (HCC)   Dermatitis   Gout   Chronic kidney disease, stage 3 unspecified (HCC)   Lower extremity edema    Bilateral lower extremity edema -   acute on chronic worsening.  As per the patient patient has chronic lower EXTR edema after he  received radiation for his prostate cancer in 2005.  Over the last few days has been worsening.  Denies shortness of breath.  Will check 2D echo was given 1 dose of Lasix .  Will decide further doses based on 2D echo and probably may need compression stockings.  Continue triamterene  hydrochlorothiazide . Left lower extremity wound with erythema likely in the setting of venous stasis dermatitis.  Patient sustained wound after his leg hit on a car door 2 weeks ago.  Will consult wound team.  Continue antibiotics. History of gout was started on allopurinol  and prednisone  about 2 months ago as per the patient. Chronic kidney disease stage III creatinine at around baseline.  Closely monitor. History of prostate cancer status post radiation therapy.  Since patient has lower EXTR edema which has been worsening with wound will need close monitoring and more than 2 midnight stay.   DVT prophylaxis: Lovenox . Code Status: Full code. Family Communication: Patient's sister. Disposition Plan: Monitored bed. Consults called: Wound team. Admission status: Observation.

## 2023-10-03 NOTE — ED Notes (Signed)
 Patient transported to US  via treatment bed. No acute distress at this time.

## 2023-10-03 NOTE — ED Triage Notes (Addendum)
 Arrives POV with complaints of worsening left leg wound. Wound has been present x2 weeks. Patient had an incident where his leg was hit by a car door. Now, the site has started leaking.

## 2023-10-03 NOTE — Progress Notes (Signed)
 Plan of Care Note for accepted transfer   Patient: Christopher [name] MRN: [id number]   DOA: 10/03/2023  Facility requesting transfer: [facility]. Requesting Provider: Lamar Gander, MD. Reason for transfer: Heart failure and failed left lower extremity cellulitis outpatient therapy. Facility course:  89 year old male with (seasonal allergies, osteoarthritis, prostate cancer, VPCs, carpal tunnel syndrome of the left, stage III CKD, diverticulosis, gout, hypertensive renal disease, class II obesity, senile purpura, varicose veins of the lower extremities who was brought to the emergency department due to left lower extremity cellulitis for the past 2 weeks with failed oral antibiotic therapy due to allergic reaction, wound is now leaking, he has bilateral lower extremity edema and dyspnea.  Plan of care: The patient is accepted for admission to Telemetry unit, at [facility].  He received ceftriaxone  2 g IVPB and furosemide  40 mg IVP.  Author: Alm Dorn Castor, MD 10/03/2023  Check [email or web address] for on-call coverage.  Nursing staff, Please call [facility] Admits & Consults System-Wide number on Amion as soon as patient's arrival, so appropriate admitting provider can evaluate the pt.

## 2023-10-03 NOTE — ED Provider Notes (Signed)
 Christopher [name] EMERGENCY DEPARTMENT AT [facility] Provider Note   CSN: [id number] Arrival date & time: 10/03/23  1037     Patient presents with: Wound Check (Left leg)   Christopher [name] is a 89 y.o. male.   89 year old male with a history of lower extremity edema, venous insufficiency status post saphenous ablation, and left lower extremity wound who presents emergency department with redness and pain of his left lower extremity.  Patient reports that he hit his left lower extremity in a car door 9/12.  Has been getting treatment as an outpatient through the wound care center.  Was trialed on antibiotics 2 weeks ago but he said he had to stop taking because of an allergic reaction.  Over the past day has had worsening redness going up his leg as well as pain.  No fevers or chills.  Does follow with the wound care center but is not able to be seen until October.       Prior to Admission medications   Medication Sig Start Date End Date Taking? Authorizing Provider  acetaminophen  (TYLENOL ) 325 MG tablet 1 tablet as needed    [provider]  cephALEXin (KEFLEX) 500 MG capsule Take 500 mg by mouth 3 (three) times daily. 03/11/20   [provider]  clotrimazole -betamethasone  (LOTRISONE ) cream Apply topically 2 (two) times daily as needed (itching).     [provider]  furosemide  (LASIX ) 40 MG tablet 1 tablet 05/13/20   [provider]  ketoconazole (NIZORAL) 2 % cream  11/20/15   [provider]  mirabegron ER (MYRBETRIQ) 25 MG TB24 tablet 1 tablet 11/17/20   [provider]  OVER THE COUNTER MEDICATION daily. Swedish bitters, 2 tsp    [provider]  triamcinolone cream (KENALOG) 0.5 % MIX 1 APPLICATION WITH 1/2 GOLD BOND AND APPLY EXTERNALLY ONCE A DAY FOR 1 DAY    [provider]  triamterene -hydrochlorothiazide  (MAXZIDE ) 75-50 MG tablet 1 tablet in the morning    [provider]    Allergies: Chicken  allergy, Macrolides and ketolides, Other, Mirabegron, Penicillins, and Poultry meal    Review of Systems  Updated Vital Signs BP 128/77   Pulse 63   Temp 97.6 F (36.4 C) (Oral)   Resp (!) 21   Ht 5' 8 (1.727 m)   Wt 117.5 kg   SpO2 96%   BMI 39.39 kg/m   Physical Exam Constitutional:      Appearance: Normal appearance.  Musculoskeletal:     Right lower leg: Edema present.     Left lower leg: Edema present.     Comments: DP pulse 2+ bilaterally  Neurological:     Mental Status: He is alert.    Left lower extremity:    (all labs ordered are listed, but only abnormal results are displayed) Labs Reviewed  BASIC METABOLIC PANEL WITH GFR - Abnormal; Notable for the following components:      Result Value   Potassium 3.4 (*)    CO2 21 (*)    Glucose, Bld 102 (*)    BUN 33 (*)    Creatinine, Ser 1.95 (*)    GFR, Estimated 32 (*)    All other components within normal limits  PRO BRAIN NATRIURETIC PEPTIDE - Abnormal; Notable for the following components:   Pro Brain Natriuretic Peptide 1,698.0 (*)    All other components within normal limits  MAGNESIUM  - Abnormal; Notable for the following components:   Magnesium  1.6 (*)    All  other components within normal limits  TROPONIN T, HIGH SENSITIVITY - Abnormal; Notable for the following components:   Troponin T High Sensitivity 47 (*)    All other components within normal limits  CULTURE, BLOOD (ROUTINE X 2)  CULTURE, BLOOD (ROUTINE X 2)  CBC WITH DIFFERENTIAL/PLATELET  PHOSPHORUS    EKG: None  Radiology: DG Chest Portable 1 View Result Date: 10/03/2023 CLINICAL DATA:  volume overload EXAM: PORTABLE CHEST - 1 VIEW COMPARISON:  10/01/2023 FINDINGS: Low lung volumes. No focal airspace consolidation, pleural effusion, or pneumothorax. Mild cardiomegaly. Tortuous aorta with aortic atherosclerosis. No acute fracture or destructive lesions. Multilevel thoracic osteophytosis. Surgical clips in the left upper quadrant.  IMPRESSION: No acute cardiopulmonary abnormality. Electronically Signed   By: Rogelia Myers M.D.   On: 10/03/2023 13:20   US  Venous Img Lower Unilateral Left Result Date: 10/03/2023 CLINICAL DATA:  Left lower extremity with weeping EXAM: LEFT LOWER EXTREMITY VENOUS DOPPLER ULTRASOUND TECHNIQUE: Gray-scale sonography with graded compression, as well as color Doppler and duplex ultrasound were performed to evaluate the lower extremity deep venous systems from the level of the common femoral vein and including the common femoral, femoral, profunda femoral, popliteal and calf veins including the posterior tibial, peroneal and gastrocnemius veins when visible. The superficial great saphenous vein was also interrogated. Spectral Doppler was utilized to evaluate flow at rest and with distal augmentation maneuvers in the common femoral, femoral and popliteal veins. COMPARISON:  None Available. FINDINGS: Contralateral Common Femoral Vein: Respiratory phasicity is normal and symmetric with the symptomatic side. No evidence of thrombus. Normal compressibility. Common Femoral Vein: No evidence of thrombus. Normal compressibility, respiratory phasicity and response to augmentation. Saphenofemoral Junction: No evidence of thrombus. Normal compressibility and flow on color Doppler imaging. Profunda Femoral Vein: No evidence of thrombus. Normal compressibility and flow on color Doppler imaging. Femoral Vein: No evidence of thrombus. Normal compressibility, respiratory phasicity and response to augmentation. Popliteal Vein: No evidence of thrombus. Normal compressibility, respiratory phasicity and response to augmentation. Calf Veins: No evidence of thrombus. Normal compressibility and flow on color Doppler imaging. Superficial Great Saphenous Vein: No evidence of thrombus. Normal compressibility. Venous Reflux:  None. Other Findings:  Positive for superficial subcutaneous edema. IMPRESSION: No evidence of deep venous  thrombosis. Positive for superficial subcutaneous edema which may be related to volume overload. Electronically Signed   By: Wilkie Lent M.D.   On: 10/03/2023 12:28     Procedures   Medications Ordered in the ED  magnesium  sulfate (IV Push/IM) injection 2 g (has no administration in time range)  cefTRIAXone  (ROCEPHIN ) 2 g in sodium chloride  0.9 % 100 mL IVPB (0 g Intravenous Stopped 10/03/23 1258)  furosemide  (LASIX ) injection 40 mg (40 mg Intravenous Given 10/03/23 1302)                                    Medical Decision Making Amount and/or Complexity of Data Reviewed Labs: ordered. Radiology: ordered.  Risk Prescription drug management. Decision regarding hospitalization.   Oliverio Cho is a 89 year old male with a history of lower extremity edema, venous insufficiency status post saphenous ablation, and left lower extremity wound who presents emergency department with redness and pain of his left lower extremity.  Initial Ddx:  Cellulitis, venous stasis dermatitis, CHF  MDM/Course:  Patient presents to the emergency department with lower extremity redness and pain.  This is around a site where he has an abrasion  of his been very slow to heal and is managed by wound care.  Satting well on room air.  Vital signs are reassuring and he is not febrile.  No signs of necrotizing fasciitis on exam.  Appears neurovascular intact in his leg distally.  Does have what appears to be some cellulitis superimposed on venous stasis dermatitis.  Given IV antibiotics and had blood cultures in the emergency department.  Not septic at this point in time.  No purulent drainage that would warrant MRSA coverage at this point in time.  Performed shared decision making with the patient and his family regarding disposition and they are concerned with his age, the fact that they have tried antibiotics at home already, and the that they cannot see wound care until October and they are requesting that he  be admitted.  BNP is also significantly elevated so did order chest x-ray.  Patient may benefit from echo to assess for heart failure potentially being related to the symptoms.  Given a dose of Lasix  and discussed with hospitalist for admission  This patient presents to the ED for concern of complaints listed in HPI, this involves an extensive number of treatment options, and is a complaint that carries with it a high risk of complications and morbidity. Disposition including potential need for admission considered.   Dispo: Admit to Floor  Additional history obtained from sister Records reviewed Outpatient Clinic Notes The following labs were independently interpreted: Chemistry and show CKD I independently reviewed the following imaging with scope of interpretation limited to determining acute life threatening conditions related to emergency care: Chest x-ray and agree with the radiologist interpretation with the following exceptions: none I have reviewed the patients home medications and made adjustments as needed Consults: Hospitalist Social Determinants of health:  Geriatric  Portions of this note were generated with Scientist, clinical (histocompatibility and immunogenetics). Dictation errors may occur despite best attempts at proofreading.     Final diagnoses:  Cellulitis of left lower extremity  Lower extremity edema    ED Discharge Orders     None          Yolande Lamar BROCKS, MD 10/03/23 1424

## 2023-10-04 ENCOUNTER — Observation Stay (HOSPITAL_COMMUNITY)

## 2023-10-04 DIAGNOSIS — M7989 Other specified soft tissue disorders: Secondary | ICD-10-CM | POA: Diagnosis not present

## 2023-10-04 DIAGNOSIS — I5041 Acute combined systolic (congestive) and diastolic (congestive) heart failure: Secondary | ICD-10-CM | POA: Diagnosis not present

## 2023-10-04 LAB — CBC WITH DIFFERENTIAL/PLATELET
Abs Immature Granulocytes: 0.03 K/uL (ref 0.00–0.07)
Basophils Absolute: 0.1 K/uL (ref 0.0–0.1)
Basophils Relative: 1 %
Eosinophils Absolute: 0.3 K/uL (ref 0.0–0.5)
Eosinophils Relative: 4 %
HCT: 43.3 % (ref 39.0–52.0)
Hemoglobin: 13.5 g/dL (ref 13.0–17.0)
Immature Granulocytes: 0 %
Lymphocytes Relative: 30 %
Lymphs Abs: 2.3 K/uL (ref 0.7–4.0)
MCH: 29.3 pg (ref 26.0–34.0)
MCHC: 31.2 g/dL (ref 30.0–36.0)
MCV: 94.1 fL (ref 80.0–100.0)
Monocytes Absolute: 0.8 K/uL (ref 0.1–1.0)
Monocytes Relative: 11 %
Neutro Abs: 4 K/uL (ref 1.7–7.7)
Neutrophils Relative %: 54 %
Platelets: 237 K/uL (ref 150–400)
RBC: 4.6 MIL/uL (ref 4.22–5.81)
RDW: 13.1 % (ref 11.5–15.5)
WBC: 7.4 K/uL (ref 4.0–10.5)
nRBC: 0 % (ref 0.0–0.2)

## 2023-10-04 LAB — COMPREHENSIVE METABOLIC PANEL WITH GFR
ALT: 9 U/L (ref 0–44)
AST: 21 U/L (ref 15–41)
Albumin: 3.3 g/dL — ABNORMAL LOW (ref 3.5–5.0)
Alkaline Phosphatase: 71 U/L (ref 38–126)
Anion gap: 14 (ref 5–15)
BUN: 31 mg/dL — ABNORMAL HIGH (ref 8–23)
CO2: 26 mmol/L (ref 22–32)
Calcium: 9.6 mg/dL (ref 8.9–10.3)
Chloride: 101 mmol/L (ref 98–111)
Creatinine, Ser: 1.71 mg/dL — ABNORMAL HIGH (ref 0.61–1.24)
GFR, Estimated: 37 mL/min — ABNORMAL LOW (ref >60)
Glucose, Bld: 88 mg/dL (ref 70–99)
Potassium: 3.2 mmol/L — ABNORMAL LOW (ref 3.5–5.1)
Sodium: 141 mmol/L (ref 135–145)
Total Bilirubin: 0.8 mg/dL (ref 0.0–1.2)
Total Protein: 6.3 g/dL — ABNORMAL LOW (ref 6.5–8.1)

## 2023-10-04 LAB — ECHOCARDIOGRAM COMPLETE
AR max vel: 1.02 cm2
AV Area VTI: 1.09 cm2
AV Area mean vel: 1.08 cm2
AV Mean grad: 17 mmHg
AV Peak grad: 27.5 mmHg
Ao pk vel: 2.62 m/s
Area-P 1/2: 3.91 cm2
Height: 68 in
S' Lateral: 4.2 cm
Weight: 4144.65 [oz_av]

## 2023-10-04 LAB — MAGNESIUM: Magnesium: 2.2 mg/dL (ref 1.7–2.4)

## 2023-10-04 MED ORDER — POTASSIUM CHLORIDE 20 MEQ PO PACK
40.0000 meq | PACK | Freq: Once | ORAL | Status: AC
  Administered 2023-10-04: 40 meq via ORAL
  Filled 2023-10-04: qty 2

## 2023-10-04 NOTE — Progress Notes (Signed)
 This RN received report from previous shift Charity fundraiser. Agree with documented assessment, patient in bed comfortably.

## 2023-10-04 NOTE — Progress Notes (Signed)
 PROGRESS NOTE    Christopher [name]  FMW:[id number] DOB: [birth year] DOA: 10/03/2023 PCP: [name] LABOR, MD   Brief Narrative:  This 89 years old male with PMH significant for chronic lower extremity edema which patient attributes to after receiving radiation to the prostate in 2005, He has been on triamterene + hydrochlorothiazide , history of CKD stage IIIa, chronic left leg wound after he hit on the car door about 2 weeks ago.  Patient reports wound has been progressively getting worse with some discharge and had been following up with his primary care physician.  He was prescribed antibiotic which contained penicillin and patient has developed an allergic reaction(swelling in his lips and ulceration on his tongue) after taking 2 doses so medication was immediately discontinued.  He has not been on any antibiotics after that.  Patient presented with worsening left lower extremity swelling with discharge.  Venous duplex negative for DVT.  proBNP 1600, troponin 47.  Patient was given 40 IV Lasix  with concern for new onset CHF and started on empiric ceftriaxone  and admitted for further evaluation.  Assessment & Plan:   Principal Problem:   Swelling of lower extremity Active Problems:   Cancer of prostate (HCC)   Dermatitis   Gout   Chronic kidney disease, stage 3 unspecified (HCC)   Lower extremity edema  Bilateral lower extremity edema : Acute on chronic  > worsening.   Patient reports having chronic lower extremity edema after he received radiation for his prostate cancer in 2005.   Over the last few days swelling has been worsening.  Denies shortness of breath.   Patient was given 1 dose of IV Lasix . Obtain 2D echocardiogram Continue triamterene  plus hydrochlorothiazide . He may need compression stockings.  Left lower extremity wound with erythema: Likely in the setting of venous stasis dermatitis.   Patient sustained wound after his leg hit on a car door 2 weeks ago.   Continue  empiric antibiotics.  Wound care consult  History of gout: He was started on allopurinol  and prednisone  about 2 months ago .  CKD stage IIIa: Serum creatinine at baseline.  Avoid nephrotoxic medications.  History of prostate cancer status post radiation therapy. Follow-up outpatient.  Obesity: Diet and exercise discussed in detail   DVT prophylaxis: Lovenox  Code Status: Full code Family Communication:No family at bed side Disposition Plan:    Status is: Observation The patient remains OBS appropriate and will d/c before 2 midnights.   Patient admitted for worsening lower extremity edema with left lower extremity wound with ulceration.  Consultants:  Wound Care  Procedures:  Antimicrobials: Anti-infectives (From admission, onward)    Start     Dose/Rate Route Frequency Ordered Stop   10/04/23 1200  cefTRIAXone  (ROCEPHIN ) 1 g in sodium chloride  0.9 % 100 mL IVPB        1 g 200 mL/hr over 30 Minutes Intravenous Every 24 hours 10/03/23 2113     10/03/23 1130  cefTRIAXone  (ROCEPHIN ) 2 g in sodium chloride  0.9 % 100 mL IVPB        2 g 200 mL/hr over 30 Minutes Intravenous  Once 10/03/23 1121 10/03/23 1258      Subjective: Patient seen and examined at bedside.  Overnight events noted. Patient was sitting comfortably on the edge of the bed stated his left leg is swollen and has drainage. Wound care has just did the dressing.  Objective: Vitals:   10/03/23 1700 10/03/23 1815 10/03/23 1939 10/04/23 0416  BP: 114/67 127/69 135/68 118/67  Pulse: (!) 57  60 62 62  Resp: 20 18 19 19   Temp:  97.9 F (36.6 C) 97.8 F (36.6 C) (!) 97.4 F (36.3 C)  TempSrc:  Oral Oral Oral  SpO2: 99% 98% 97% 100%  Weight:      Height:        Intake/Output Summary (Last 24 hours) at 10/04/2023 1116 Last data filed at 10/03/2023 1654 Gross per 24 hour  Intake 102.94 ml  Output 650 ml  Net -547.06 ml   Filed Weights   10/03/23 1051  Weight: 117.5 kg    Examination:  General exam:  Appears calm and comfortable, morbidly obese, not in any acute distress. Respiratory system: Clear to auscultation. Respiratory effort normal.  RR 14 Cardiovascular system: S1 & S2 heard, RRR. No JVD, murmurs, rubs, gallops or clicks.  Gastrointestinal system: Abdomen is non distended, soft and non tender.  Normal bowel sounds heard. Central nervous system: Alert and oriented x 3. No focal neurological deficits. Extremities: Bilateral lower extremity edema+, left leg covered in dressing. Skin: No rashes, lesions or ulcers Psychiatry: Judgement and insight appear normal. Mood & affect appropriate.     Data Reviewed: I have personally reviewed following labs and imaging studies  CBC: Recent Labs  Lab 10/01/23 1234 10/03/23 1139 10/04/23 0428  WBC 8.4 8.2 7.4  NEUTROABS  --  5.3 4.0  HGB 13.3 13.8 13.5  HCT 39.8 41.1 43.3  MCV 90.7 90.5 94.1  PLT 229 228 237   Basic Metabolic Panel: Recent Labs  Lab 10/01/23 1234 10/03/23 1139 10/04/23 0428  NA 139 137 141  K 3.5 3.4* 3.2*  CL 101 101 101  CO2 26 21* 26  GLUCOSE 93 102* 88  BUN 38* 33* 31*  CREATININE 1.88* 1.95* 1.71*  CALCIUM 9.8 10.0 9.6  MG 1.7 1.6* 2.2  PHOS  --  3.3  --    GFR: Estimated Creatinine Clearance: 34.3 mL/min (A) (by C-G formula based on SCr of 1.71 mg/dL (H)). Liver Function Tests: Recent Labs  Lab 10/04/23 0428  AST 21  ALT 9  ALKPHOS 71  BILITOT 0.8  PROT 6.3*  ALBUMIN  3.3*   No results for input(s): LIPASE, AMYLASE in the last 168 hours. No results for input(s): AMMONIA in the last 168 hours. Coagulation Profile: No results for input(s): INR, PROTIME in the last 168 hours. Cardiac Enzymes: No results for input(s): CKTOTAL, CKMB, CKMBINDEX, TROPONINI in the last 168 hours. BNP (last 3 results) Recent Labs    10/03/23 1139  PROBNP 1,698.0*   HbA1C: No results for input(s): HGBA1C in the last 72 hours. CBG: No results for input(s): GLUCAP in the last 168  hours. Lipid Profile: No results for input(s): CHOL, HDL, LDLCALC, TRIG, CHOLHDL, LDLDIRECT in the last 72 hours. Thyroid  Function Tests: No results for input(s): TSH, T4TOTAL, FREET4, T3FREE, THYROIDAB in the last 72 hours. Anemia Panel: No results for input(s): VITAMINB12, FOLATE, FERRITIN, TIBC, IRON, RETICCTPCT in the last 72 hours. Sepsis Labs: No results for input(s): PROCALCITON, LATICACIDVEN in the last 168 hours.  Recent Results (from the past 240 hours)  Blood culture (routine x 2)     Status: None (Preliminary result)   Collection Time: 10/03/23 11:22 AM   Specimen: BLOOD LEFT ARM  Result Value Ref Range Status   Specimen Description   Final    BLOOD LEFT ARM Performed at [facility], [address] [place], [place] [address]    Special Requests   Final    Blood Culture  adequate volume BOTTLES DRAWN AEROBIC AND ANAEROBIC Performed at [facility], [address], [place], [place] [address]    Culture   Final    NO GROWTH < 24 HOURS Performed at [facility], [address], [place], [place] [address]    Report Status PENDING  Incomplete  Blood culture (routine x 2)     Status: None (Preliminary result)   Collection Time: 10/03/23 12:17 PM   Specimen: BLOOD  Result Value Ref Range Status   Specimen Description   Final    BLOOD RIGHT ANTECUBITAL Performed at [facility], [address] [place], [place] [address]    Special Requests   Final    BOTTLES DRAWN AEROBIC AND ANAEROBIC Blood Culture adequate volume Performed at [facility], [address] [place], [place] [address]    Culture   Final    NO GROWTH < 24 HOURS Performed at [facility], [address] [place], [place] [address]    Report Status PENDING  Incomplete     Radiology Studies: DG Chest Portable 1 View Result Date: 10/03/2023 CLINICAL DATA:  volume overload EXAM: PORTABLE  CHEST - 1 VIEW COMPARISON:  10/01/2023 FINDINGS: Low lung volumes. No focal airspace consolidation, pleural effusion, or pneumothorax. Mild cardiomegaly. Tortuous aorta with aortic atherosclerosis. No acute fracture or destructive lesions. Multilevel thoracic osteophytosis. Surgical clips in the left upper quadrant. IMPRESSION: No acute cardiopulmonary abnormality. Electronically Signed   By: Rogelia Myers M.D.   On: 10/03/2023 13:20   US  Venous Img Lower Unilateral Left Result Date: 10/03/2023 CLINICAL DATA:  Left lower extremity with weeping EXAM: LEFT LOWER EXTREMITY VENOUS DOPPLER ULTRASOUND TECHNIQUE: Gray-scale sonography with graded compression, as well as color Doppler and duplex ultrasound were performed to evaluate the lower extremity deep venous systems from the level of the common femoral vein and including the common femoral, femoral, profunda femoral, popliteal and calf veins including the posterior tibial, peroneal and gastrocnemius veins when visible. The superficial great saphenous vein was also interrogated. Spectral Doppler was utilized to evaluate flow at rest and with distal augmentation maneuvers in the common femoral, femoral and popliteal veins. COMPARISON:  None Available. FINDINGS: Contralateral Common Femoral Vein: Respiratory phasicity is normal and symmetric with the symptomatic side. No evidence of thrombus. Normal compressibility. Common Femoral Vein: No evidence of thrombus. Normal compressibility, respiratory phasicity and response to augmentation. Saphenofemoral Junction: No evidence of thrombus. Normal compressibility and flow on color Doppler imaging. Profunda Femoral Vein: No evidence of thrombus. Normal compressibility and flow on color Doppler imaging. Femoral Vein: No evidence of thrombus. Normal compressibility, respiratory phasicity and response to augmentation. Popliteal Vein: No evidence of thrombus. Normal compressibility, respiratory phasicity and response to  augmentation. Calf Veins: No evidence of thrombus. Normal compressibility and flow on color Doppler imaging. Superficial Great Saphenous Vein: No evidence of thrombus. Normal compressibility. Venous Reflux:  None. Other Findings:  Positive for superficial subcutaneous edema. IMPRESSION: No evidence of deep venous thrombosis. Positive for superficial subcutaneous edema which may be related to volume overload. Electronically Signed   By: Wilkie Lent M.D.   On: 10/03/2023 12:28   Scheduled Meds:  allopurinol   100 mg Oral Daily   enoxaparin  (LOVENOX ) injection  40 mg Subcutaneous Q24H   predniSONE   5 mg Oral Q breakfast   triamterene -hydrochlorothiazide   1 tablet Oral Daily   Continuous Infusions:  cefTRIAXone  (ROCEPHIN )  IV       LOS: 0 days    Time spent:  50 mins    Darcel Dawley, MD [facility]   If 7PM-7AM, please contact night-coverage

## 2023-10-04 NOTE — Consult Note (Signed)
 WOC Nurse Consult Note: L leg wound, appears to have been seen at [facility] for treatment, no wound care center notes found  Reason for Consult: L leg wound  Wound type: full thickness L lateral leg r/t trauma per patient (hit on car door); in setting of venous insufficiency  Pressure Injury POA: NA not pressure  Measurement: see nursing flowsheet  Wound bed: 50% red 50% tan  Drainage (amount, consistency, odor) appears mix of tan and serosanguinous on old dressing; weeping reported in MD notes  Periwound: edema and erythema  Dressing procedure/placement/frequency: Cleanse L lateral leg wound with Vashe, do not rinse and allow to air dry. Apply silver hydrofiber ([employer] Lawson [id number]) to wound bed daily, cover with ABD pad and secure with Kerlix roll gauze beginning right above toes and ending right below knees.  Apply Ace bandage wrapped in same fashion as Kerlix for light compression.   Patient appears to have referral to wound care center per notes, would be beneficial to keep this for ongoing management of wound and venous insufficiency.   POC discussed with bedside nurse. WOC team will not follow. Re-consult if further needs arise.   Thank you,    Powell Bar MSN, RN-BC, [facility]

## 2023-10-05 DIAGNOSIS — Z860100 Personal history of colon polyps, unspecified: Secondary | ICD-10-CM | POA: Diagnosis not present

## 2023-10-05 DIAGNOSIS — N179 Acute kidney failure, unspecified: Secondary | ICD-10-CM | POA: Diagnosis present

## 2023-10-05 DIAGNOSIS — N1831 Chronic kidney disease, stage 3a: Secondary | ICD-10-CM | POA: Diagnosis present

## 2023-10-05 DIAGNOSIS — Z8546 Personal history of malignant neoplasm of prostate: Secondary | ICD-10-CM | POA: Diagnosis not present

## 2023-10-05 DIAGNOSIS — E876 Hypokalemia: Secondary | ICD-10-CM | POA: Diagnosis not present

## 2023-10-05 DIAGNOSIS — I272 Pulmonary hypertension, unspecified: Secondary | ICD-10-CM | POA: Diagnosis present

## 2023-10-05 DIAGNOSIS — C61 Malignant neoplasm of prostate: Secondary | ICD-10-CM | POA: Diagnosis not present

## 2023-10-05 DIAGNOSIS — M109 Gout, unspecified: Secondary | ICD-10-CM | POA: Diagnosis present

## 2023-10-05 DIAGNOSIS — L03116 Cellulitis of left lower limb: Secondary | ICD-10-CM

## 2023-10-05 DIAGNOSIS — I878 Other specified disorders of veins: Secondary | ICD-10-CM | POA: Diagnosis present

## 2023-10-05 DIAGNOSIS — I493 Ventricular premature depolarization: Secondary | ICD-10-CM | POA: Diagnosis present

## 2023-10-05 DIAGNOSIS — I872 Venous insufficiency (chronic) (peripheral): Secondary | ICD-10-CM | POA: Diagnosis present

## 2023-10-05 DIAGNOSIS — M7989 Other specified soft tissue disorders: Secondary | ICD-10-CM | POA: Diagnosis present

## 2023-10-05 DIAGNOSIS — E875 Hyperkalemia: Secondary | ICD-10-CM | POA: Diagnosis not present

## 2023-10-05 DIAGNOSIS — R600 Localized edema: Secondary | ICD-10-CM

## 2023-10-05 DIAGNOSIS — Z6839 Body mass index (BMI) 39.0-39.9, adult: Secondary | ICD-10-CM | POA: Diagnosis not present

## 2023-10-05 DIAGNOSIS — D692 Other nonthrombocytopenic purpura: Secondary | ICD-10-CM | POA: Diagnosis present

## 2023-10-05 DIAGNOSIS — E66812 Obesity, class 2: Secondary | ICD-10-CM | POA: Diagnosis present

## 2023-10-05 DIAGNOSIS — Z96641 Presence of right artificial hip joint: Secondary | ICD-10-CM | POA: Diagnosis present

## 2023-10-05 DIAGNOSIS — Z923 Personal history of irradiation: Secondary | ICD-10-CM | POA: Diagnosis not present

## 2023-10-05 DIAGNOSIS — I130 Hypertensive heart and chronic kidney disease with heart failure and stage 1 through stage 4 chronic kidney disease, or unspecified chronic kidney disease: Secondary | ICD-10-CM | POA: Diagnosis present

## 2023-10-05 DIAGNOSIS — T501X5A Adverse effect of loop [high-ceiling] diuretics, initial encounter: Secondary | ICD-10-CM | POA: Diagnosis present

## 2023-10-05 DIAGNOSIS — I509 Heart failure, unspecified: Secondary | ICD-10-CM | POA: Diagnosis present

## 2023-10-05 DIAGNOSIS — N183 Chronic kidney disease, stage 3 unspecified: Secondary | ICD-10-CM

## 2023-10-05 DIAGNOSIS — Z87891 Personal history of nicotine dependence: Secondary | ICD-10-CM | POA: Diagnosis not present

## 2023-10-05 DIAGNOSIS — L0390 Cellulitis, unspecified: Secondary | ICD-10-CM | POA: Diagnosis present

## 2023-10-05 DIAGNOSIS — I350 Nonrheumatic aortic (valve) stenosis: Secondary | ICD-10-CM | POA: Diagnosis present

## 2023-10-05 DIAGNOSIS — I5041 Acute combined systolic (congestive) and diastolic (congestive) heart failure: Secondary | ICD-10-CM | POA: Diagnosis present

## 2023-10-05 DIAGNOSIS — I739 Peripheral vascular disease, unspecified: Secondary | ICD-10-CM | POA: Diagnosis present

## 2023-10-05 DIAGNOSIS — R7989 Other specified abnormal findings of blood chemistry: Secondary | ICD-10-CM | POA: Diagnosis not present

## 2023-10-05 LAB — RENAL FUNCTION PANEL
Albumin: 3.3 g/dL — ABNORMAL LOW (ref 3.5–5.0)
Anion gap: 12 (ref 5–15)
BUN: 32 mg/dL — ABNORMAL HIGH (ref 8–23)
CO2: 26 mmol/L (ref 22–32)
Calcium: 9.6 mg/dL (ref 8.9–10.3)
Chloride: 101 mmol/L (ref 98–111)
Creatinine, Ser: 1.85 mg/dL — ABNORMAL HIGH (ref 0.61–1.24)
GFR, Estimated: 34 mL/min — ABNORMAL LOW (ref >60)
Glucose, Bld: 98 mg/dL (ref 70–99)
Phosphorus: 2.8 mg/dL (ref 2.5–4.6)
Potassium: 3.3 mmol/L — ABNORMAL LOW (ref 3.5–5.1)
Sodium: 139 mmol/L (ref 135–145)

## 2023-10-05 LAB — CBC
HCT: 43 % (ref 39.0–52.0)
Hemoglobin: 13.9 g/dL (ref 13.0–17.0)
MCH: 30.2 pg (ref 26.0–34.0)
MCHC: 32.3 g/dL (ref 30.0–36.0)
MCV: 93.3 fL (ref 80.0–100.0)
Platelets: 227 K/uL (ref 150–400)
RBC: 4.61 MIL/uL (ref 4.22–5.81)
RDW: 13.2 % (ref 11.5–15.5)
WBC: 7.5 K/uL (ref 4.0–10.5)
nRBC: 0 % (ref 0.0–0.2)

## 2023-10-05 MED ORDER — FUROSEMIDE 10 MG/ML IJ SOLN
40.0000 mg | Freq: Once | INTRAMUSCULAR | Status: AC
  Administered 2023-10-05: 40 mg via INTRAVENOUS
  Filled 2023-10-05: qty 4

## 2023-10-05 NOTE — Progress Notes (Signed)
 [facility]                                                                              Stclair Szymborski, is a 89 y.o. male, DOB - 1928/01/14, FMW:[id number] Admit date - 10/03/2023    Outpatient Primary MD for the patient is [name] LABOR, MD  LOS - 0  days  Chief Complaint  Patient presents with   Wound Check    Left leg       Brief summary   Patient is a 89 year old male with chronic lower extremity edema, he attributes it to after receiving radiation treatment to the prostate in 2005, HTN, CKD stage III, had a wound on his left leg after the leg hit on the edge of the car door about 2 weeks ago.  Per patient, he has done courses of doxycycline and subsequently doxycycline and Keflex from his PCP with no significant improvement.  His left leg was swelling and was recommended to come to the ER. In ED, patient was placed on IV Rocephin  and received Lasix  40 mg IV x 1.   Assessment & Plan    Principal Problem: Left lower leg cellulitis - Multiple courses of doxycycline and doxycycline with Keflex with no significant improvement.  Patient also has lower extremity edema and venous stasis - For now continue IV Rocephin , will remove dressing in the morning and reassess the wound   Active Problems: Bilateral lower extremity edema, acute systolic and diastolic CHF -Lower extremity edema with BNP 1698 with mildly elevated troponins on admission -Venous Dopplers that showed no DVT, positive for superficial subcutaneous edema may be related to volume overload - 2D echo showed EF of 45 to 50%, global hypokinesis, moderate concentric LVH, G2 DD, normal right ventricular systolic function, mild LA, mild MR, mild to moderate aortic stenosis.  No prior echo to compare with - DC triamterene  HCTZ, already has received dose for today  - Lasix  40 mg IV x 1, reassess renal function in a.m. for further IV diuresis -Strict I's and O's and daily weights -Requested cardiology  consult   History of gout - Has been on allopurinol  and is on outpatient, will continue    Cancer of prostate (HCC) -Status post radiation therapy, outpatient follow-up with urology/oncology   Mild acute on chronic kidney disease, stage 3 unspecified (HCC)   -Baseline creatinine 1.6-1.8 - Presented with creatinine of 1.9, will likely trend up with IV Lasix , follow closely   Obesity class II Estimated body mass index is 39.39 kg/m as calculated from the following:   Height as of this encounter: 5' 8 (1.727 m).   Weight as of this encounter: 117.5 kg.  Code Status: Full code DVT Prophylaxis:  enoxaparin  (LOVENOX ) injection 40 mg Start: 10/03/23 2200   Level of Care: Level of care: Telemetry Family Communication: Updated patient's sister at the bedside Disposition Plan:      Remains inpatient appropriate: Needs IV antibiotics, IV Lasix    Procedures:  2D echo  Consultants:   Cardiology  Antimicrobials:   Anti-infectives (From admission, onward)    Start     Dose/Rate Route Frequency Ordered Stop  10/04/23 1200  cefTRIAXone  (ROCEPHIN ) 1 g in sodium chloride  0.9 % 100 mL IVPB        1 g 200 mL/hr over 30 Minutes Intravenous Every 24 hours 10/03/23 2113     10/03/23 1130  cefTRIAXone  (ROCEPHIN ) 2 g in sodium chloride  0.9 % 100 mL IVPB        2 g 200 mL/hr over 30 Minutes Intravenous  Once 10/03/23 1121 10/03/23 1258          Medications  allopurinol   100 mg Oral Daily   enoxaparin  (LOVENOX ) injection  40 mg Subcutaneous Q24H   predniSONE   5 mg Oral Q breakfast   triamterene -hydrochlorothiazide   1 tablet Oral Daily      Subjective:   Prosper Paff was seen and examined today.   Dressing intact on the left leg, per patient was changed this morning.  No acute chest pain, abdominal pain nausea or vomiting, acute shortness of breath.  Bilateral  Objective:   Vitals:   10/04/23 0416 10/04/23 1317 10/04/23 2058 10/05/23 0513  BP: 118/67 131/67 119/80 130/76   Pulse: 62 73 70 65  Resp: 19 18 18 19   Temp: (!) 97.4 F (36.3 C) 98 F (36.7 C) 97.6 F (36.4 C) 97.7 F (36.5 C)  TempSrc: Oral Oral Oral Oral  SpO2: 100% 98% 97% 98%  Weight:      Height:        Intake/Output Summary (Last 24 hours) at 10/05/2023 1420 Last data filed at 10/05/2023 0200 Gross per 24 hour  Intake 220 ml  Output --  Net 220 ml     Wt Readings from Last 3 Encounters:  10/03/23 117.5 kg  10/01/23 117.5 kg  04/05/23 117 kg     Exam General: Alert and oriented x 3, NAD Cardiovascular: S1 S2 auscultated,  RRR Respiratory: Clear to auscultation bilaterally, no wheezing, rales or rhonchi Gastrointestinal: Soft, nontender, nondistended, + bowel sounds Ext: Bilateral extremity edema Neuro: No new deficits  skin: Dressing intact on the left leg Psych: Normal affect     Data Reviewed:  I have personally reviewed following labs    CBC Lab Results  Component Value Date   WBC 7.5 10/05/2023   RBC 4.61 10/05/2023   HGB 13.9 10/05/2023   HCT 43.0 10/05/2023   MCV 93.3 10/05/2023   MCH 30.2 10/05/2023   PLT 227 10/05/2023   MCHC 32.3 10/05/2023   RDW 13.2 10/05/2023   LYMPHSABS 2.3 10/04/2023   MONOABS 0.8 10/04/2023   EOSABS 0.3 10/04/2023   BASOSABS 0.1 10/04/2023     Last metabolic panel Lab Results  Component Value Date   NA 139 10/05/2023   K 3.3 (L) 10/05/2023   CL 101 10/05/2023   CO2 26 10/05/2023   BUN 32 (H) 10/05/2023   CREATININE 1.85 (H) 10/05/2023   GLUCOSE 98 10/05/2023   GFRNONAA 34 (L) 10/05/2023   GFRAA 49 (L) 11/29/2014   CALCIUM 9.6 10/05/2023   PHOS 2.8 10/05/2023   PROT 6.3 (L) 10/04/2023   ALBUMIN  3.3 (L) 10/05/2023   BILITOT 0.8 10/04/2023   ALKPHOS 71 10/04/2023   AST 21 10/04/2023   ALT 9 10/04/2023   ANIONGAP 12 10/05/2023    CBG (last 3)  No results for input(s): GLUCAP in the last 72 hours.    Coagulation Profile: No results for input(s): INR, PROTIME in the last 168 hours.   Radiology  Studies: I have personally reviewed the imaging studies  ECHOCARDIOGRAM COMPLETE Result Date: 10/04/2023  ECHOCARDIOGRAM REPORT   Patient Name:   JACARRI GESNER Date of Exam: 10/04/2023 Medical Rec #:  [id number]    Height:       68.0 in Accession #:    [id number]   Weight:       259.0 lb Date of Birth:  11-17-1928   BSA:          2.281 m Patient Age:    89 years     BP:           118/67 mmHg Patient Gender: M            HR:           80 bpm. Exam Location:  Inpatient Procedure: 2D Echo, Cardiac Doppler, Color Doppler and 3D Echo (Both Spectral            and Color Flow Doppler were utilized during procedure). Indications:    CHF  History:        Patient has no prior history of Echocardiogram examinations.  Sonographer:    Philomena Daring Referring Phys: [id number] ARSHAD N KAKRAKANDY IMPRESSIONS  1. Left ventricular ejection fraction, by estimation, is 45 to 50%. The left ventricle has mildly decreased function. The left ventricle demonstrates global hypokinesis. There is moderate concentric left ventricular hypertrophy. Left ventricular diastolic parameters are consistent with Grade II diastolic dysfunction (pseudonormalization).  2. Right ventricular systolic function is normal. The right ventricular size is normal. There is moderately elevated pulmonary artery systolic pressure.  3. Left atrial size was mildly dilated.  4. The mitral valve is grossly normal. Mild mitral valve regurgitation. No evidence of mitral stenosis.  5. Decreased stroke volume index. DVI 0.35 and TFR 184. Valve visually appears calcified with decrease motion and aortic stenosis may be underestimated. Largest gradient seen in the apical window. The aortic valve is calcified. Aortic valve regurgitation is trivial. Mild to moderate aortic valve stenosis. Aortic valve area, by VTI measures 1.09 cm. Aortic valve mean gradient measures 17.0 mmHg. Aortic valve Vmax measures 2.62 m/s. Aortic valve acceleration time measures 84 msec. Comparison(s): No  prior Echocardiogram. FINDINGS  Left Ventricle: Left ventricular ejection fraction, by estimation, is 45 to 50%. The left ventricle has mildly decreased function. The left ventricle demonstrates global hypokinesis. The left ventricular internal cavity size was normal in size. There is  moderate concentric left ventricular hypertrophy. Left ventricular diastolic parameters are consistent with Grade II diastolic dysfunction (pseudonormalization). Right Ventricle: The right ventricular size is normal. No increase in right ventricular wall thickness. Right ventricular systolic function is normal. There is moderately elevated pulmonary artery systolic pressure. The tricuspid regurgitant velocity is 2.93 m/s, and with an assumed right atrial pressure of 20 mmHg, the estimated right ventricular systolic pressure is 54.3 mmHg. Left Atrium: Left atrial size was mildly dilated. Right Atrium: Right atrial size was normal in size. Pericardium: There is no evidence of pericardial effusion. Mitral Valve: The mitral valve is grossly normal. Mild mitral annular calcification. Mild mitral valve regurgitation. No evidence of mitral valve stenosis. Tricuspid Valve: The tricuspid valve is normal in structure. Tricuspid valve regurgitation is not demonstrated. No evidence of tricuspid stenosis. Aortic Valve: Decreased stroke volume index. DVI 0.35 and TFR 184. Valve visually appears calcified with decrease motion and aortic stenosis may be underestimated. Largest gradient seen in the apical window. The aortic valve is calcified. Aortic valve regurgitation is trivial. Mild to moderate aortic stenosis is present. Aortic valve mean gradient measures 17.0 mmHg. Aortic valve peak gradient measures 27.5  mmHg. Aortic valve area, by VTI measures 1.09 cm. Pulmonic Valve: The pulmonic valve was not well visualized. Pulmonic valve regurgitation is not visualized. No evidence of pulmonic stenosis. Aorta: The aortic root, ascending aorta and aortic  arch are all structurally normal, with no evidence of dilitation or obstruction. IAS/Shunts: The atrial septum is grossly normal. Additional Comments: 3D was performed not requiring image post processing on an independent workstation and was abnormal.  LEFT VENTRICLE PLAX 2D LVIDd:         5.10 cm   Diastology LVIDs:         4.20 cm   LV e' medial:    6.09 cm/s LV PW:         1.30 cm   LV E/e' medial:  15.2 LV IVS:        1.30 cm   LV e' lateral:   7.29 cm/s LVOT diam:     2.00 cm   LV E/e' lateral: 12.7 LV SV:         58 LV SV Index:   25 LVOT Area:     3.14 cm                           3D Volume EF:                          3D EF:        43 %                          LV EDV:       156 ml                          LV ESV:       89 ml                          LV SV:        67 ml RIGHT VENTRICLE             IVC RV S prime:     11.40 cm/s  IVC diam: 2.60 cm TAPSE (M-mode): 1.8 cm LEFT ATRIUM             Index        RIGHT ATRIUM           Index LA diam:        4.10 cm 1.80 cm/m   RA Area:     22.20 cm LA Vol (A2C):   84.2 ml 36.91 ml/m  RA Volume:   64.70 ml  28.36 ml/m LA Vol (A4C):   70.2 ml 30.77 ml/m LA Biplane Vol: 81.7 ml 35.81 ml/m  AORTIC VALVE AV Area (Vmax):    1.02 cm AV Area (Vmean):   1.08 cm AV Area (VTI):     1.09 cm AV Vmax:           262.00 cm/s AV Vmean:          169.400 cm/s AV VTI:            0.529 m AV Peak Grad:      27.5 mmHg AV Mean Grad:      17.0 mmHg LVOT Vmax:         84.85 cm/s LVOT Vmean:        58.450  cm/s LVOT VTI:          0.184 m LVOT/AV VTI ratio: 0.35  AORTA Ao Root diam: 2.60 cm Ao Asc diam:  4.00 cm MITRAL VALVE               TRICUSPID VALVE MV Area (PHT): 3.91 cm    TR Peak grad:   34.3 mmHg MV Decel Time: 194 msec    TR Vmax:        293.00 cm/s MV E velocity: 92.60 cm/s MV A velocity: 90.70 cm/s  SHUNTS MV E/A ratio:  1.02        Systemic VTI:  0.18 m                            Systemic Diam: 2.00 cm Stanly Leavens MD Electronically signed by Stanly Leavens  MD Signature Date/Time: 10/04/2023/12:33:44 PM    Final        [name] M.D. [facility] 10/05/2023, 2:20 PM  Available via Epic secure chat 7am-7pm After 7 pm, please refer to night coverage provider listed on amion.
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
## 2023-10-06 ENCOUNTER — Other Ambulatory Visit: Payer: Self-pay

## 2023-10-06 ENCOUNTER — Inpatient Hospital Stay (INDEPENDENT_AMBULATORY_CARE_PROVIDER_SITE_OTHER)

## 2023-10-06 DIAGNOSIS — C61 Malignant neoplasm of prostate: Secondary | ICD-10-CM | POA: Diagnosis not present

## 2023-10-06 DIAGNOSIS — R600 Localized edema: Secondary | ICD-10-CM | POA: Diagnosis not present

## 2023-10-06 DIAGNOSIS — R7989 Other specified abnormal findings of blood chemistry: Secondary | ICD-10-CM

## 2023-10-06 DIAGNOSIS — I272 Pulmonary hypertension, unspecified: Secondary | ICD-10-CM | POA: Diagnosis not present

## 2023-10-06 DIAGNOSIS — I350 Nonrheumatic aortic (valve) stenosis: Secondary | ICD-10-CM

## 2023-10-06 DIAGNOSIS — I493 Ventricular premature depolarization: Secondary | ICD-10-CM

## 2023-10-06 DIAGNOSIS — E876 Hypokalemia: Secondary | ICD-10-CM

## 2023-10-06 DIAGNOSIS — L03116 Cellulitis of left lower limb: Secondary | ICD-10-CM | POA: Diagnosis not present

## 2023-10-06 DIAGNOSIS — N179 Acute kidney failure, unspecified: Secondary | ICD-10-CM

## 2023-10-06 LAB — RENAL FUNCTION PANEL
Albumin: 3.4 g/dL — ABNORMAL LOW (ref 3.5–5.0)
Anion gap: 15 (ref 5–15)
BUN: 39 mg/dL — ABNORMAL HIGH (ref 8–23)
CO2: 24 mmol/L (ref 22–32)
Calcium: 9.7 mg/dL (ref 8.9–10.3)
Chloride: 99 mmol/L (ref 98–111)
Creatinine, Ser: 2.01 mg/dL — ABNORMAL HIGH (ref 0.61–1.24)
GFR, Estimated: 31 mL/min — ABNORMAL LOW (ref >60)
Glucose, Bld: 95 mg/dL (ref 70–99)
Phosphorus: 3.3 mg/dL (ref 2.5–4.6)
Potassium: 3.3 mmol/L — ABNORMAL LOW (ref 3.5–5.1)
Sodium: 138 mmol/L (ref 135–145)

## 2023-10-06 LAB — CBC
HCT: 43.9 % (ref 39.0–52.0)
Hemoglobin: 13.7 g/dL (ref 13.0–17.0)
MCH: 29.1 pg (ref 26.0–34.0)
MCHC: 31.2 g/dL (ref 30.0–36.0)
MCV: 93.2 fL (ref 80.0–100.0)
Platelets: 195 K/uL (ref 150–400)
RBC: 4.71 MIL/uL (ref 4.22–5.81)
RDW: 13 % (ref 11.5–15.5)
WBC: 7.6 K/uL (ref 4.0–10.5)
nRBC: 0 % (ref 0.0–0.2)

## 2023-10-06 MED ORDER — ENOXAPARIN SODIUM 30 MG/0.3ML IJ SOSY: 30.0000 mg | PREFILLED_SYRINGE | INTRAMUSCULAR | Status: DC

## 2023-10-06 MED ORDER — LINEZOLID 600 MG PO TABS: 600.0000 mg | ORAL_TABLET | Freq: Two times a day (BID) | ORAL | 0 refills | Status: AC

## 2023-10-06 MED ORDER — POTASSIUM CHLORIDE CRYS ER 20 MEQ PO TBCR
40.0000 meq | EXTENDED_RELEASE_TABLET | Freq: Once | ORAL | Status: AC
Start: 2023-10-06 — End: 2023-10-06
  Administered 2023-10-06: 40 meq via ORAL
  Filled 2023-10-06: qty 2

## 2023-10-06 NOTE — Discharge Summary (Signed)
 Physician Discharge Summary   Patient: Christopher [name] DOB: 01-15-1928  Admit date:     10/03/2023  Discharge date: 10/06/23  Discharge Physician: [name], MD    PCP: [name] LABOR, MD   Recommendations at discharge:   Hold triamterene -hydrochlorothiazide  BMET in 1 week to follow-up on K and renal function Consider weaning off prednisone .  Discharge Diagnoses:  Left leg cellulitis   Cancer of prostate (HCC)   Gout   Chronic kidney disease, stage 3 unspecified (HCC)   PVC's (premature ventricular contractions)   Lower extremity edema   Acute CHF (congestive heart failure) (HCC)   Pulmonary hypertension, unspecified (HCC)   AKI (acute kidney injury)   Hypokalemia   Nonrheumatic aortic valve stenosis   Elevated troponin  Hospital Course:  Patient is a 89 year old male with chronic lower extremity edema, he attributes it to after receiving radiation treatment to the prostate in 2005, HTN, CKD stage III, had a wound on his left leg after the leg hit on the edge of the car door about 2 weeks ago.  Per patient, he has done courses of doxycycline and subsequently doxycycline and Keflex from his PCP with no significant improvement.  His left leg was swelling and was recommended to come to the ER. In ED, patient was placed on IV Rocephin  and received Lasix  40 mg IV x 1.  Assessment and Plan:   Left lower leg cellulitis - Multiple courses of doxycycline and doxycycline with Keflex with no significant improvement.  Patient also has lower extremity edema and venous stasis - On IV Rocephin  - Dressing removed today. Cellulitis and edema has significantly improved, hold off on any further diuresis - Transition to oral linezolid  x 5days at discharge, continue wound care as per instructions.   Bilateral lower extremity edema Moderate to severe aortic stenosis Moderate pulmonary hypertension Frequent PVCs, elevated BNP -Lower extremity edema with BNP 1698 with  mildly elevated troponins on admission -Venous Dopplers that showed no DVT, positive for superficial subcutaneous edema may be related to volume overload - 2D echo showed EF of 45 to 50%, global hypokinesis, moderate concentric LVH, G2 DD, normal right ventricular systolic function, mild LA, mild MR, mild to moderate aortic stenosis.  No prior echo to compare with - Triamterene  HCTZ placed on hold, patient received IV Lasix  x 2 doses during hospitalization.   -Creatinine 2.1, hold off on further diuresis given creatinine trended up and aortic stenosis. - Current allergy was consulted, seen by [name], recommended outpatient 2-week Zio patch to assess the PVC load.  Patient noted to be in bigeminal PVCs and ventricular couplets, and on the recent EKG.  Avoid ARB, ARNI, MRA now due to AKI on CKD -Cardiology will schedule follow-up appointment outpatient.  Aortic stenosis, moderate - [name] personally reviewed the echo images, valve is heavily calcified, with reduced leaflet excursion, definitely has at least moderate aortic stenosis. This will need to be followed up by yearly echo to assess for progression and will arrange follow-up with the structural heart clinic outpatient for evaluation.    History of gout - Has been on allopurinol  and is on outpatient, will continue -However I recommend patient be weaned off chronic prednisone , will defer to PCP     Cancer of prostate (HCC) -Status post radiation therapy, outpatient follow-up with urology/oncology     Mild acute on chronic kidney disease, stage 3 unspecified (HCC)   -Baseline creatinine 1.6-1.8 - Presented with creatinine of 1.9, dropped to 2.1 due to  IV Lasix , triamterene , hydrochlorothiazide  - Hold triamterene -HCTZ.  No further diuresis.     Obesity class II Estimated body mass index is 35.43 kg/m as calculated from the following:   Height as of this encounter: 5' 8 (1.727 m).   Weight as of this encounter: 105.7 kg.      Pain control - [name]  Controlled Substance Reporting System database was reviewed. and patient was instructed, not to drive, operate heavy machinery, perform activities at heights, swimming or participation in water activities or provide baby-sitting services while on Pain, Sleep and Anxiety Medications; until their outpatient Physician has advised to do so again. Also recommended to not to take more than prescribed Pain, Sleep and Anxiety Medications.  Consultants: Cardiology Procedures performed: Echo Disposition: Home Diet recommendation:  Discharge Diet Orders (From admission, onward)     Start     Ordered   10/06/23 0000  Diet - low sodium heart healthy        10/06/23 1502            DISCHARGE MEDICATION: Allergies as of 10/06/2023       Reactions   Chicken Allergy Anaphylaxis, Swelling   Other reaction(s): Unknown   Macrolides And Ketolides    Other Reaction(s): Other (See Comments) Kidney pain   Cephalexin Swelling   Swelling of lips, ulceration of the tongue. Tolerated Rocephin  10/06/23.   Mirabegron    Other reaction(s): side pain   Penicillins Itching, Swelling        Medication List     PAUSE taking these medications    triamterene -hydrochlorothiazide  75-50 MG tablet Wait to take this until your doctor or other care provider tells you to start again. Commonly known as: MAXZIDE  Take 1 tablet by mouth daily.       TAKE these medications    allopurinol  100 MG tablet Commonly known as: ZYLOPRIM  Take 100 mg by mouth daily.   linezolid  600 MG tablet Commonly known as: ZYVOX  Take 1 tablet (600 mg total) by mouth 2 (two) times daily for 5 days.   OVER THE COUNTER MEDICATION daily. Swedish bitters, 2 tsp   predniSONE  5 MG tablet Commonly known as: DELTASONE  Take 5 mg by mouth daily.   Tylenol  325 MG tablet Generic drug: acetaminophen  Take 325 mg by mouth every 6 (six) hours as needed for mild pain (pain score 1-3) or moderate pain (pain  score 4-6).               Discharge Care Instructions  (From admission, onward)           Start     Ordered   10/06/23 0000  Discharge wound care:       Comments: Wound care  Daily      Comments: Cleanse L lateral leg wound with Vashe, do not rinse and allow to air dry. Apply silver hydrofiber  cut to fit wound bed daily, cover with ABD pad and secure with Kerlix roll gauze beginning right above toes and ending right below knee.  Apply Ace bandage wrapped in same fashion as Kerlix for light compression. SOAK SILVER WITH saline IF ADHERED TO WOUND BED FOR ATRAUMATIC REMOVAL.   10/06/23 1502            Follow-up Information     [name] LABOR, MD. Schedule an appointment as soon as possible for a visit in 1 week(s).   Specialty: Internal Medicine Why: for hospital follow-up, obtain labs BMET (metabolic panel) Contact information: [address]  [address] [place] [address] [phone]                Discharge Exam: Fredricka Weights   10/03/23 1051 10/06/23 0500  Weight: 117.5 kg 105.7 kg   S: Wants to go home badly today, seen by cardiology, cleared cellulitis has significantly improved.  BP 119/66 (BP Location: Right Arm)   Pulse 64   Temp 97.8 F (36.6 C) (Oral)   Resp 16   Ht 5' 8 (1.727 m)   Wt 105.7 kg   SpO2 95%   BMI 35.43 kg/m   Physical Exam General: Alert and oriented x 3, NAD Cardiovascular: S1 S2 clear, RRR.  Respiratory: CTAB, no wheezing Gastrointestinal: Soft, nontender, nondistended, NBS Ext: no pedal edema bilaterally Neuro: no new deficits Skin: See below Psych: Normal affect    On admission    On discharge     Condition at discharge: fair  The results of significant diagnostics from this hospitalization (including imaging, microbiology, ancillary and laboratory) are listed below for reference.   Imaging Studies: ECHOCARDIOGRAM COMPLETE Result Date: 10/04/2023    ECHOCARDIOGRAM REPORT   Patient  Name:   Christopher [name] Date of Exam: 10/04/2023 Medical Rec #:  [name]    Height:       68.0 in Accession #:    [id number]   Weight:       259.0 lb Date of Birth:  05/19/1928   BSA:          2.281 m Patient Age:    89 years     BP:           118/67 mmHg Patient Gender: M            HR:           80 bpm. Exam Location:  Inpatient Procedure: 2D Echo, Cardiac Doppler, Color Doppler and 3D Echo (Both Spectral            and Color Flow Doppler were utilized during procedure). Indications:    CHF  History:        Patient has no prior history of Echocardiogram examinations.  Sonographer:    Philomena Daring Referring Phys: [id number] ARSHAD N KAKRAKANDY IMPRESSIONS  1. Left ventricular ejection fraction, by estimation, is 45 to 50%. The left ventricle has mildly decreased function. The left ventricle demonstrates global hypokinesis. There is moderate concentric left ventricular hypertrophy. Left ventricular diastolic parameters are consistent with Grade II diastolic dysfunction (pseudonormalization).  2. Right ventricular systolic function is normal. The right ventricular size is normal. There is moderately elevated pulmonary artery systolic pressure.  3. Left atrial size was mildly dilated.  4. The mitral valve is grossly normal. Mild mitral valve regurgitation. No evidence of mitral stenosis.  5. Decreased stroke volume index. DVI 0.35 and TFR 184. Valve visually appears calcified with decrease motion and aortic stenosis may be underestimated. Largest gradient seen in the apical window. The aortic valve is calcified. Aortic valve regurgitation is trivial. Mild to moderate aortic valve stenosis. Aortic valve area, by VTI measures 1.09 cm. Aortic valve mean gradient measures 17.0 mmHg. Aortic valve Vmax measures 2.62 m/s. Aortic valve acceleration time measures 84 msec. Comparison(s): No prior Echocardiogram. FINDINGS  Left Ventricle: Left ventricular ejection fraction, by estimation, is 45 to 50%. The left ventricle has mildly  decreased function. The left ventricle demonstrates global hypokinesis. The left ventricular internal cavity size was normal in size. There is  moderate concentric left ventricular hypertrophy. Left ventricular diastolic parameters are  consistent with Grade II diastolic dysfunction (pseudonormalization). Right Ventricle: The right ventricular size is normal. No increase in right ventricular wall thickness. Right ventricular systolic function is normal. There is moderately elevated pulmonary artery systolic pressure. The tricuspid regurgitant velocity is 2.93 m/s, and with an assumed right atrial pressure of 20 mmHg, the estimated right ventricular systolic pressure is 54.3 mmHg. Left Atrium: Left atrial size was mildly dilated. Right Atrium: Right atrial size was normal in size. Pericardium: There is no evidence of pericardial effusion. Mitral Valve: The mitral valve is grossly normal. Mild mitral annular calcification. Mild mitral valve regurgitation. No evidence of mitral valve stenosis. Tricuspid Valve: The tricuspid valve is normal in structure. Tricuspid valve regurgitation is not demonstrated. No evidence of tricuspid stenosis. Aortic Valve: Decreased stroke volume index. DVI 0.35 and TFR 184. Valve visually appears calcified with decrease motion and aortic stenosis may be underestimated. Largest gradient seen in the apical window. The aortic valve is calcified. Aortic valve regurgitation is trivial. Mild to moderate aortic stenosis is present. Aortic valve mean gradient measures 17.0 mmHg. Aortic valve peak gradient measures 27.5 mmHg. Aortic valve area, by VTI measures 1.09 cm. Pulmonic Valve: The pulmonic valve was not well visualized. Pulmonic valve regurgitation is not visualized. No evidence of pulmonic stenosis. Aorta: The aortic root, ascending aorta and aortic arch are all structurally normal, with no evidence of dilitation or obstruction. IAS/Shunts: The atrial septum is grossly normal. Additional  Comments: 3D was performed not requiring image post processing on an independent workstation and was abnormal.  LEFT VENTRICLE PLAX 2D LVIDd:         5.10 cm   Diastology LVIDs:         4.20 cm   LV e' medial:    6.09 cm/s LV PW:         1.30 cm   LV E/e' medial:  15.2 LV IVS:        1.30 cm   LV e' lateral:   7.29 cm/s LVOT diam:     2.00 cm   LV E/e' lateral: 12.7 LV SV:         58 LV SV Index:   25 LVOT Area:     3.14 cm                           3D Volume EF:                          3D EF:        43 %                          LV EDV:       156 ml                          LV ESV:       89 ml                          LV SV:        67 ml RIGHT VENTRICLE             IVC RV S prime:     11.40 cm/s  IVC diam: 2.60 cm TAPSE (M-mode): 1.8 cm LEFT ATRIUM  Index        RIGHT ATRIUM           Index LA diam:        4.10 cm 1.80 cm/m   RA Area:     22.20 cm LA Vol (A2C):   84.2 ml 36.91 ml/m  RA Volume:   64.70 ml  28.36 ml/m LA Vol (A4C):   70.2 ml 30.77 ml/m LA Biplane Vol: 81.7 ml 35.81 ml/m  AORTIC VALVE AV Area (Vmax):    1.02 cm AV Area (Vmean):   1.08 cm AV Area (VTI):     1.09 cm AV Vmax:           262.00 cm/s AV Vmean:          169.400 cm/s AV VTI:            0.529 m AV Peak Grad:      27.5 mmHg AV Mean Grad:      17.0 mmHg LVOT Vmax:         84.85 cm/s LVOT Vmean:        58.450 cm/s LVOT VTI:          0.184 m LVOT/AV VTI ratio: 0.35  AORTA Ao Root diam: 2.60 cm Ao Asc diam:  4.00 cm MITRAL VALVE               TRICUSPID VALVE MV Area (PHT): 3.91 cm    TR Peak grad:   34.3 mmHg MV Decel Time: 194 msec    TR Vmax:        293.00 cm/s MV E velocity: 92.60 cm/s MV A velocity: 90.70 cm/s  SHUNTS MV E/A ratio:  1.02        Systemic VTI:  0.18 m                            Systemic Diam: 2.00 cm Stanly Leavens MD Electronically signed by Stanly Leavens MD Signature Date/Time: 10/04/2023/12:33:44 PM    Final    DG Chest Portable 1 View Result Date: 10/03/2023 CLINICAL DATA:  volume  overload EXAM: PORTABLE CHEST - 1 VIEW COMPARISON:  10/01/2023 FINDINGS: Low lung volumes. No focal airspace consolidation, pleural effusion, or pneumothorax. Mild cardiomegaly. Tortuous aorta with aortic atherosclerosis. No acute fracture or destructive lesions. Multilevel thoracic osteophytosis. Surgical clips in the left upper quadrant. IMPRESSION: No acute cardiopulmonary abnormality. Electronically Signed   By: Rogelia Myers M.D.   On: 10/03/2023 13:20   US  Venous Img Lower Unilateral Left Result Date: 10/03/2023 CLINICAL DATA:  Left lower extremity with weeping EXAM: LEFT LOWER EXTREMITY VENOUS DOPPLER ULTRASOUND TECHNIQUE: Gray-scale sonography with graded compression, as well as color Doppler and duplex ultrasound were performed to evaluate the lower extremity deep venous systems from the level of the common femoral vein and including the common femoral, femoral, profunda femoral, popliteal and calf veins including the posterior tibial, peroneal and gastrocnemius veins when visible. The superficial great saphenous vein was also interrogated. Spectral Doppler was utilized to evaluate flow at rest and with distal augmentation maneuvers in the common femoral, femoral and popliteal veins. COMPARISON:  None Available. FINDINGS: Contralateral Common Femoral Vein: Respiratory phasicity is normal and symmetric with the symptomatic side. No evidence of thrombus. Normal compressibility. Common Femoral Vein: No evidence of thrombus. Normal compressibility, respiratory phasicity and response to augmentation. Saphenofemoral Junction: No evidence of thrombus. Normal compressibility and flow on color Doppler imaging. Profunda Femoral Vein: No evidence of  thrombus. Normal compressibility and flow on color Doppler imaging. Femoral Vein: No evidence of thrombus. Normal compressibility, respiratory phasicity and response to augmentation. Popliteal Vein: No evidence of thrombus. Normal compressibility, respiratory  phasicity and response to augmentation. Calf Veins: No evidence of thrombus. Normal compressibility and flow on color Doppler imaging. Superficial Great Saphenous Vein: No evidence of thrombus. Normal compressibility. Venous Reflux:  None. Other Findings:  Positive for superficial subcutaneous edema. IMPRESSION: No evidence of deep venous thrombosis. Positive for superficial subcutaneous edema which may be related to volume overload. Electronically Signed   By: Wilkie Lent M.D.   On: 10/03/2023 12:28   DG Chest Port 1 View Result Date: 10/01/2023 CLINICAL DATA:  Palpitations. EXAM: PORTABLE CHEST 1 VIEW COMPARISON:  02/22/2017 FINDINGS: Low volume film. Cardiopericardial silhouette is at upper limits of normal for size. The lungs are clear without focal pneumonia, edema, pneumothorax or pleural effusion. No acute bony abnormality. Telemetry leads overlie the chest. IMPRESSION: Low volume film without acute cardiopulmonary findings. Electronically Signed   By: Camellia Candle M.D.   On: 10/01/2023 11:47    Microbiology: Results for orders placed or performed during the hospital encounter of 10/03/23  Blood culture (routine x 2)     Status: None (Preliminary result)   Collection Time: 10/03/23 11:22 AM   Specimen: BLOOD LEFT ARM  Result Value Ref Range Status   Specimen Description   Final    BLOOD LEFT ARM Performed at [facility], [address] [place], [place] [address]    Special Requests   Final    Blood Culture adequate volume BOTTLES DRAWN AEROBIC AND ANAEROBIC Performed at [facility], [address], [place], [place] [address]    Culture   Final    NO GROWTH 3 DAYS Performed at [facility], [address], [place], [place] [address]    Report Status PENDING  Incomplete  Blood culture (routine x 2)     Status: None (Preliminary result)   Collection Time: 10/03/23 12:17 PM   Specimen: BLOOD  Result Value Ref Range Status   Specimen  Description   Final    BLOOD RIGHT ANTECUBITAL Performed at [facility], [address] [place], [place] [address]    Special Requests   Final    BOTTLES DRAWN AEROBIC AND ANAEROBIC Blood Culture adequate volume Performed at [facility], [address] [place], [place] [address]    Culture   Final    NO GROWTH 3 DAYS Performed at [facility], [address] [place], [place] [address]    Report Status PENDING  Incomplete    Labs: CBC: Recent Labs  Lab 10/01/23 1234 10/03/23 1139 10/04/23 0428 10/05/23 0724 10/06/23 0419  WBC 8.4 8.2 7.4 7.5 7.6  NEUTROABS  --  5.3 4.0  --   --   HGB 13.3 13.8 13.5 13.9 13.7  HCT 39.8 41.1 43.3 43.0 43.9  MCV 90.7 90.5 94.1 93.3 93.2  PLT 229 228 237 227 195   Basic Metabolic Panel: Recent Labs  Lab 10/01/23 1234 10/03/23 1139 10/04/23 0428 10/05/23 0724 10/06/23 0419  NA 139 137 141 139 138  K 3.5 3.4* 3.2* 3.3* 3.3*  CL 101 101 101 101 99  CO2 26 21* 26 26 24   GLUCOSE 93 102* 88 98 95  BUN 38* 33* 31* 32* 39*  CREATININE 1.88* 1.95* 1.71* 1.85* 2.01*  CALCIUM 9.8 10.0 9.6 9.6 9.7  MG 1.7 1.6* 2.2  --   --  PHOS  --  3.3  --  2.8 3.3   Liver Function Tests: Recent Labs  Lab 10/04/23 0428 10/05/23 0724 10/06/23 0419  AST 21  --   --   ALT 9  --   --   ALKPHOS 71  --   --   BILITOT 0.8  --   --   PROT 6.3*  --   --   ALBUMIN  3.3* 3.3* 3.4*   CBG: No results for input(s): GLUCAP in the last 168 hours.  Discharge time spent: greater than 30 minutes.  Signed: [name], MD [facility] 10/06/2023

## 2023-10-06 NOTE — Progress Notes (Signed)
 Mobility Specialist - Progress Note   10/06/23 1500  Mobility  Activity Ambulated independently  Level of Assistance Standby assist, set-up cues, supervision of patient - no hands on  Assistive Device None  Distance Ambulated (ft) 350 ft  Range of Motion/Exercises Active  Activity Response Tolerated well  Mobility Referral Yes  Mobility visit 1 Mobility  Mobility Specialist Start Time (ACUTE ONLY) 1450  Mobility Specialist Stop Time (ACUTE ONLY) 1505  Mobility Specialist Time Calculation (min) (ACUTE ONLY) 15 min   Pt was found in bed and agreeable to mobilize. No complaints. Returned to bed with all needs met. Call bell in reach and family in room.   Erminio Leos,  Mobility Specialist Can be reached via Secure Chat

## 2023-10-06 NOTE — Consult Note (Signed)
 Cardiology Consultation   Patient ID: Christopher [name] MRN: [id number]; DOB: [birth year]  Admit date: 10/03/2023 Date of Consult: 10/06/2023  PCP:  [name] LABOR, MD   [facility] Providers Cardiologist:  None        Patient Profile: Christopher [name] is a 89 y.o. male with a hx of obesity, CKD, hx of prostate cancer in 2005, peripheral venous insufficiency s/p saphenous ablation, and frequent PVCs  who is being seen 10/06/2023 for the evaluation of acute congestive heart failure at the request of Ripudeep Rai MD.  History of Present Illness: Christopher [name] has not been seen by a cardiologist. It appears he doe shave a history of peripheral edema with peripheral venous insufficiency and new diagnosis of frequent PVCs. In regards to his peripheral edema this has been a chronic issue since his radiation treatment for his prostate cancer.  On 9/20 presented to [facility] due to an abnormal EKG seen at an outpatient visit for wound care. He was asymptomatic. It was noted he was having frequent PVCs. Troponin 43-> 44. As he was asymptomatic and the remainder of his work-up was unremarkable, patient was discharged with cardiology follow-up.  ECG at that time: Sinus rhythm with consecutive PVCs [bigeminy with extra ectopy]. Unable to accurate assess ST changes given degree of ectopy, though no notice ST elevations or depressions. VR 86   Presented to the ED 9/22 for worsening leg wound.Image in Media tab. BP: 103/67   HR 80  ECG sinus rhythm, non-specific TWI abnormal R wave progression VR 62 CXR no acute cardiopulmonary abnormality.  US  Venous: superficial subcutaneous edema though no evidence of DVT Echocardiogram LVEF 45-50 % with mild global hypokinesis and moderate LVH. G2 DD. Moderately elevated PASP.Mildly dilated LA. Mild MR. Mild to moderate AS, though suspected to be underestimated. VTI 1.09 and gradient 17. Decreased stroke volume. He was admitted for cellulitis and started on IV  antibiotics.   Lab work: K 3.4   Mag 1.6  Cr 1.95 normal CBC  albumin  3.3 Pro-BNP 1698 [normal with age adjustment] Troponin 47 [ 42 from previous ED visit 9/20] Remains hyperkalemic K 3.3 and Cr stable  He has received minimal K repletion and has received 5 doses of IV lasix  40 mg.  Net IO Since Admission: -327.06 mL [10/06/23 1245] He has lost 26lb since admission.   Denied lightheadedness/dizziness, chest pain, palpitations, shortness of breath, orthopnea, PND, and abdominal distention. He noticed about a month ago his legs started swelling more than usual, went to his PCP about this issue. HE then developed a wound that was being care for outpatient. He ultimately presented to due pain in his leg.   He walked the hall of the unit without chest pain and shortness of breath.    Past Medical History:  Diagnosis Date   Bruises easily    both hands   Cancer (HCC) 2005   prostate cancer treated with external beam radiation and radioactive seeds   Cataracts, bilateral    immature   Chronic kidney disease    Complication of anesthesia    states he was given too much anesthesia during his knee replacement   Constipation    takes Colace daily as needed   Decreased hearing    Diverticulitis 2003   DJD (degenerative joint disease)    thumbs and knees   History of colon polyps    benign   History of gout    Joint pain    Joint swelling    Leg  cramps    no meds   Peripheral edema    takes Maxzide  daily   Pneumonia    hx of-15+yrs ago   Ringing in ears    UTI (lower urinary tract infection)    just completed antibiotic on 11/14/14    Past Surgical History:  Procedure Laterality Date   APPENDECTOMY  1941   CATARACT EXTRACTION W/PHACO Right 10/05/2017   Procedure: CATARACT EXTRACTION PHACO AND INTRAOCULAR LENS PLACEMENT (IOC) RIGHT;  Surgeon: Mittie Gaskin, MD;  Location: [facility];  Service: Ophthalmology;  Laterality: Right;  requests arrival time to be after  10am   CATARACT EXTRACTION W/PHACO Left 10/26/2017   Procedure: CATARACT EXTRACTION PHACO AND INTRAOCULAR LENS PLACEMENT (IOC) LEFT;  Surgeon: Mittie Gaskin, MD;  Location: [facility];  Service: Ophthalmology;  Laterality: Left;   COLONOSCOPY     ENDOVENOUS ABLATION SAPHENOUS VEIN W/ LASER  12-01-2011   left greater saphenous vein by Krystal Doing MD    ENDOVENOUS ABLATION SAPHENOUS VEIN W/ LASER  12-22-2011   right greater saphenous vein    by Krystal Doing MD   HAND SURGERY     HEMORRHOID SURGERY  1970   PARTIAL COLECTOMY     prostate seeds      removal of chest wall tumor     sigmoid colon resection     TOTAL HIP ARTHROPLASTY Right 11/26/2014   Procedure: TOTAL HIP ARTHROPLASTY;  Surgeon: Maude LELON Right, MD;  Location: [facility] OR;  Service: Orthopedics;  Laterality: Right;   TOTAL KNEE ARTHROPLASTY  08-2010       Scheduled Meds:  allopurinol   100 mg Oral Daily   enoxaparin  (LOVENOX ) injection  30 mg Subcutaneous Q24H   predniSONE   5 mg Oral Q breakfast   Continuous Infusions:  cefTRIAXone  (ROCEPHIN )  IV 1 g (10/05/23 1319)   PRN Meds: acetaminophen  **OR** acetaminophen   Allergies:    Allergies  Allergen Reactions   Chicken Allergy Anaphylaxis    Other reaction(s): Unknown   Macrolides And Ketolides     Other Reaction(s): Other (See Comments)  Kidney pain   Other Anaphylaxis and Swelling    Chicken   Mirabegron     Other reaction(s): side pain   Penicillins Itching and Swelling   Poultry Meal Swelling    Social History:   Social History   Socioeconomic History   Marital status: Married    Spouse name: Not on file   Number of children: Not on file   Years of education: Not on file   Highest education level: Not on file  Occupational History   Not on file  Tobacco Use   Smoking status: Former    Current packs/day: 0.00    Average packs/day: 1 pack/day for 12.0 years (12.0 ttl pk-yrs)    Types: Cigarettes    Start date: 07/21/1944    Quit date:  07/21/1956    Years since quitting: 67.2   Smokeless tobacco: Never   Tobacco comments:    quick smoking 22yrs ago  Vaping Use   Vaping status: Never Used  Substance and Sexual Activity   Alcohol use: No   Drug use: No   Sexual activity: Not Currently  Other Topics Concern   Not on file  Social History Narrative   Not on file   Social Drivers of Health   Financial Resource Strain: Not on file  Food Insecurity: No Food Insecurity (10/03/2023)   Hunger Vital Sign    Worried About Running Out of Food in the  Last Year: Never true    Ran Out of Food in the Last Year: Never true  Transportation Needs: No Transportation Needs (10/03/2023)   PRAPARE - Administrator, Civil Service (Medical): No    Lack of Transportation (Non-Medical): No  Physical Activity: Not on file  Stress: Not on file  Social Connections: Moderately Isolated (10/03/2023)   Social Connection and Isolation Panel    Frequency of Communication with Friends and Family: More than three times a week    Frequency of Social Gatherings with Friends and Family: Once a week    Attends Religious Services: More than 4 times per year    Active Member of [employer] or Organizations: No    Attends Banker Meetings: Never    Marital Status: Widowed  Intimate Partner Violence: Not At Risk (10/03/2023)   Humiliation, Afraid, Rape, and Kick questionnaire    Fear of Current or Ex-Partner: No    Emotionally Abused: No    Physically Abused: No    Sexually Abused: No    Family History:   Family History  Problem Relation Age of Onset   Kidney disease Mother      ROS:  Please see the history of present illness.  All other ROS reviewed and negative.     Physical Exam/Data: Vitals:   10/05/23 1437 10/05/23 2033 10/06/23 0416 10/06/23 0500  BP: 115/75 130/81 117/78   Pulse: 81 78 68   Resp: 16 16 17    Temp: 98.2 F (36.8 C) 97.6 F (36.4 C) 97.7 F (36.5 C)   TempSrc: Oral Oral Oral   SpO2: 95% 98% 97%    Weight:    105.7 kg  Height:       No intake or output data in the 24 hours ending 10/06/23 1215    10/06/2023    5:00 AM 10/03/2023   10:51 AM 10/01/2023   11:01 AM  Last 3 Weights  Weight (lbs) 233 lb 0.4 oz 259 lb 0.7 oz 259 lb  Weight (kg) 105.7 kg 117.5 kg 117.482 kg     Body mass index is 35.43 kg/m.  General:  Extremely pleasant older male laying in bed in no acute distress HEENT: normal Vascular: No carotid bruits; Distal pulses 1+ bilaterally Cardiac:  normal S1, S2; RRR; no murmur  Lungs:  clear to auscultation bilaterally, no wheezing, rhonchi or rales  Abd: soft, nontender, no hepatomegaly  Ext: 1+ pitting edema with overlying erythema warmth and tenderness Musculoskeletal:  No deformities, BUE and BLE strength normal and equal Skin: warm and dry  Neuro:  CNs 2-12 intact, no focal abnormalities noted Psych:  Normal affect   EKG:  The EKG was personally reviewed and demonstrates:  see hpi Telemetry:  Telemetry was personally reviewed and demonstrates:  sinus rhythm with very frequent ectopy, HR 66   Laboratory Data:  Chemistry Recent Labs  Lab 10/01/23 1234 10/03/23 1139 10/04/23 0428 10/05/23 0724 10/06/23 0419  NA 139 137 141 139 138  K 3.5 3.4* 3.2* 3.3* 3.3*  CL 101 101 101 101 99  CO2 26 21* 26 26 24   GLUCOSE 93 102* 88 98 95  BUN 38* 33* 31* 32* 39*  CREATININE 1.88* 1.95* 1.71* 1.85* 2.01*  CALCIUM 9.8 10.0 9.6 9.6 9.7  MG 1.7 1.6* 2.2  --   --   GFRNONAA 33* 32* 37* 34* 31*  ANIONGAP 12 15 14 12 15     Recent Labs  Lab 10/04/23 0428 10/05/23 0724 10/06/23 0419  PROT 6.3*  --   --   ALBUMIN  3.3* 3.3* 3.4*  AST 21  --   --   ALT 9  --   --   ALKPHOS 71  --   --   BILITOT 0.8  --   --    Hematology Recent Labs  Lab 10/04/23 0428 10/05/23 0724 10/06/23 0419  WBC 7.4 7.5 7.6  RBC 4.60 4.61 4.71  HGB 13.5 13.9 13.7  HCT 43.3 43.0 43.9  MCV 94.1 93.3 93.2  MCH 29.3 30.2 29.1  MCHC 31.2 32.3 31.2  RDW 13.1 13.2 13.0  PLT 237 227  195    BNP Recent Labs  Lab 10/03/23 1139  PROBNP 1,698.0*    Radiology/Studies:  ECHOCARDIOGRAM COMPLETE Result Date: 10/04/2023    ECHOCARDIOGRAM REPORT   Patient Name:   DATHAN ATTIA Date of Exam: 10/04/2023 Medical Rec #:  [id number]    Height:       68.0 in Accession #:    [id number]   Weight:       259.0 lb Date of Birth:  10-17-1928   BSA:          2.281 m Patient Age:    89 years     BP:           118/67 mmHg Patient Gender: M            HR:           80 bpm. Exam Location:  Inpatient Procedure: 2D Echo, Cardiac Doppler, Color Doppler and 3D Echo (Both Spectral            and Color Flow Doppler were utilized during procedure). Indications:    CHF  History:        Patient has no prior history of Echocardiogram examinations.  Sonographer:    Philomena Daring Referring Phys: [id number] ARSHAD N KAKRAKANDY IMPRESSIONS  1. Left ventricular ejection fraction, by estimation, is 45 to 50%. The left ventricle has mildly decreased function. The left ventricle demonstrates global hypokinesis. There is moderate concentric left ventricular hypertrophy. Left ventricular diastolic parameters are consistent with Grade II diastolic dysfunction (pseudonormalization).  2. Right ventricular systolic function is normal. The right ventricular size is normal. There is moderately elevated pulmonary artery systolic pressure.  3. Left atrial size was mildly dilated.  4. The mitral valve is grossly normal. Mild mitral valve regurgitation. No evidence of mitral stenosis.  5. Decreased stroke volume index. DVI 0.35 and TFR 184. Valve visually appears calcified with decrease motion and aortic stenosis may be underestimated. Largest gradient seen in the apical window. The aortic valve is calcified. Aortic valve regurgitation is trivial. Mild to moderate aortic valve stenosis. Aortic valve area, by VTI measures 1.09 cm. Aortic valve mean gradient measures 17.0 mmHg. Aortic valve Vmax measures 2.62 m/s. Aortic valve acceleration time  measures 84 msec. Comparison(s): No prior Echocardiogram. FINDINGS  Left Ventricle: Left ventricular ejection fraction, by estimation, is 45 to 50%. The left ventricle has mildly decreased function. The left ventricle demonstrates global hypokinesis. The left ventricular internal cavity size was normal in size. There is  moderate concentric left ventricular hypertrophy. Left ventricular diastolic parameters are consistent with Grade II diastolic dysfunction (pseudonormalization). Right Ventricle: The right ventricular size is normal. No increase in right ventricular wall thickness. Right ventricular systolic function is normal. There is moderately elevated pulmonary artery systolic pressure. The tricuspid regurgitant velocity is 2.93 m/s, and with an assumed right atrial pressure of 20 mmHg, the estimated right  ventricular systolic pressure is 54.3 mmHg. Left Atrium: Left atrial size was mildly dilated. Right Atrium: Right atrial size was normal in size. Pericardium: There is no evidence of pericardial effusion. Mitral Valve: The mitral valve is grossly normal. Mild mitral annular calcification. Mild mitral valve regurgitation. No evidence of mitral valve stenosis. Tricuspid Valve: The tricuspid valve is normal in structure. Tricuspid valve regurgitation is not demonstrated. No evidence of tricuspid stenosis. Aortic Valve: Decreased stroke volume index. DVI 0.35 and TFR 184. Valve visually appears calcified with decrease motion and aortic stenosis may be underestimated. Largest gradient seen in the apical window. The aortic valve is calcified. Aortic valve regurgitation is trivial. Mild to moderate aortic stenosis is present. Aortic valve mean gradient measures 17.0 mmHg. Aortic valve peak gradient measures 27.5 mmHg. Aortic valve area, by VTI measures 1.09 cm. Pulmonic Valve: The pulmonic valve was not well visualized. Pulmonic valve regurgitation is not visualized. No evidence of pulmonic stenosis. Aorta: The  aortic root, ascending aorta and aortic arch are all structurally normal, with no evidence of dilitation or obstruction. IAS/Shunts: The atrial septum is grossly normal. Additional Comments: 3D was performed not requiring image post processing on an independent workstation and was abnormal.  LEFT VENTRICLE PLAX 2D LVIDd:         5.10 cm   Diastology LVIDs:         4.20 cm   LV e' medial:    6.09 cm/s LV PW:         1.30 cm   LV E/e' medial:  15.2 LV IVS:        1.30 cm   LV e' lateral:   7.29 cm/s LVOT diam:     2.00 cm   LV E/e' lateral: 12.7 LV SV:         58 LV SV Index:   25 LVOT Area:     3.14 cm                           3D Volume EF:                          3D EF:        43 %                          LV EDV:       156 ml                          LV ESV:       89 ml                          LV SV:        67 ml RIGHT VENTRICLE             IVC RV S prime:     11.40 cm/s  IVC diam: 2.60 cm TAPSE (M-mode): 1.8 cm LEFT ATRIUM             Index        RIGHT ATRIUM           Index LA diam:        4.10 cm 1.80 cm/m   RA Area:     22.20 cm LA Vol (A2C):   84.2 ml 36.91 ml/m  RA Volume:  64.70 ml  28.36 ml/m LA Vol (A4C):   70.2 ml 30.77 ml/m LA Biplane Vol: 81.7 ml 35.81 ml/m  AORTIC VALVE AV Area (Vmax):    1.02 cm AV Area (Vmean):   1.08 cm AV Area (VTI):     1.09 cm AV Vmax:           262.00 cm/s AV Vmean:          169.400 cm/s AV VTI:            0.529 m AV Peak Grad:      27.5 mmHg AV Mean Grad:      17.0 mmHg LVOT Vmax:         84.85 cm/s LVOT Vmean:        58.450 cm/s LVOT VTI:          0.184 m LVOT/AV VTI ratio: 0.35  AORTA Ao Root diam: 2.60 cm Ao Asc diam:  4.00 cm MITRAL VALVE               TRICUSPID VALVE MV Area (PHT): 3.91 cm    TR Peak grad:   34.3 mmHg MV Decel Time: 194 msec    TR Vmax:        293.00 cm/s MV E velocity: 92.60 cm/s MV A velocity: 90.70 cm/s  SHUNTS MV E/A ratio:  1.02        Systemic VTI:  0.18 m                            Systemic Diam: 2.00 cm Stanly Leavens MD  Electronically signed by Stanly Leavens MD Signature Date/Time: 10/04/2023/12:33:44 PM    Final    DG Chest Portable 1 View Result Date: 10/03/2023 CLINICAL DATA:  volume overload EXAM: PORTABLE CHEST - 1 VIEW COMPARISON:  10/01/2023 FINDINGS: Low lung volumes. No focal airspace consolidation, pleural effusion, or pneumothorax. Mild cardiomegaly. Tortuous aorta with aortic atherosclerosis. No acute fracture or destructive lesions. Multilevel thoracic osteophytosis. Surgical clips in the left upper quadrant. IMPRESSION: No acute cardiopulmonary abnormality. Electronically Signed   By: Rogelia Myers M.D.   On: 10/03/2023 13:20   US  Venous Img Lower Unilateral Left Result Date: 10/03/2023 CLINICAL DATA:  Left lower extremity with weeping EXAM: LEFT LOWER EXTREMITY VENOUS DOPPLER ULTRASOUND TECHNIQUE: Gray-scale sonography with graded compression, as well as color Doppler and duplex ultrasound were performed to evaluate the lower extremity deep venous systems from the level of the common femoral vein and including the common femoral, femoral, profunda femoral, popliteal and calf veins including the posterior tibial, peroneal and gastrocnemius veins when visible. The superficial great saphenous vein was also interrogated. Spectral Doppler was utilized to evaluate flow at rest and with distal augmentation maneuvers in the common femoral, femoral and popliteal veins. COMPARISON:  None Available. FINDINGS: Contralateral Common Femoral Vein: Respiratory phasicity is normal and symmetric with the symptomatic side. No evidence of thrombus. Normal compressibility. Common Femoral Vein: No evidence of thrombus. Normal compressibility, respiratory phasicity and response to augmentation. Saphenofemoral Junction: No evidence of thrombus. Normal compressibility and flow on color Doppler imaging. Profunda Femoral Vein: No evidence of thrombus. Normal compressibility and flow on color Doppler imaging. Femoral Vein: No  evidence of thrombus. Normal compressibility, respiratory phasicity and response to augmentation. Popliteal Vein: No evidence of thrombus. Normal compressibility, respiratory phasicity and response to augmentation. Calf Veins: No evidence of thrombus. Normal compressibility and flow on color Doppler imaging. Superficial Great Saphenous Vein: No evidence  of thrombus. Normal compressibility. Venous Reflux:  None. Other Findings:  Positive for superficial subcutaneous edema. IMPRESSION: No evidence of deep venous thrombosis. Positive for superficial subcutaneous edema which may be related to volume overload. Electronically Signed   By: Wilkie Lent M.D.   On: 10/03/2023 12:28     Assessment and Plan: Acute heart failure  No prior history of cardiac disease.  Mostly asymptomatic except for peripheral edema that has worsened beyond his baseline.  While pro-bnp is elevated, it is normal when age adjusted.  CXR shows no evidence of volume overload.  Echo this admission LVEF 45-50% with moderate LVH. G2 DD. Moderately elevated PASP.  I suspect his minimally reduced EF is potentially from ectopy burden or aortic stenosis [see below]. Overall he is minimally symptomatic. He has peripheral edema at baseline 2/2 venous insufficiency and his is currently admitted with cellulitis. Albumin  mildly reduced.  Would caution over diuresis given possible AS.   Would like to start BB 2/2 ectopy, however resting HR is 66.  Will discuss with [name].  Aortic stenosis As for the aortic stenosis it appears to be underestimated and possible evidence of LFLG.  A TEE would help to further characterize severity of stenosis. Will discuss with [name] given his age and very minimal symptoms overall if pursuing an aggressive work-up would be beneficial for patient. Patient is agreeable to further testing if needed.   Frequent Ectopy Patient is asymptomatic.  Maintain K >4 and Mag > 2.  He received K repletion  without diuresis today so will hold off on further repletion.   Per primary  CKD stage 3 Cellulitis  Risk Assessment/Risk Scores:       [place]  Heart Association (NYHA) Functional Class NYHA Class I       For questions or updates, please contact [facility] Please consult [email or web address] for contact info under      Signed, Leontine LOISE Salen, PA-C  10/06/2023 12:15 PM

## 2023-10-06 NOTE — Progress Notes (Signed)
 [facility]                                                                              Christopher [name], is a 89 y.o. male, DOB - [birth year], FMW:[id number] Admit date - 10/03/2023    Outpatient Primary MD for the patient is [name] LABOR, MD  LOS - 1  days  Chief Complaint  Patient presents with   Wound Check    Left leg       Brief summary   Patient is a 89 year old male with chronic lower extremity edema, he attributes it to after receiving radiation treatment to the prostate in 2005, HTN, CKD stage III, had a wound on his left leg after the leg hit on the edge of the car door about 2 weeks ago.  Per patient, he has done courses of doxycycline and subsequently doxycycline and Keflex from his PCP with no significant improvement.  His left leg was swelling and was recommended to come to the ER. In ED, patient was placed on IV Rocephin  and received Lasix  40 mg IV x 1.   Assessment & Plan    Principal Problem: Left lower leg cellulitis - Multiple courses of doxycycline and doxycycline with Keflex with no significant improvement.  Patient also has lower extremity edema and venous stasis - On IV Rocephin  - Dressing removed today. Cellulitis and edema has significantly improved, hold off on any further diuresis - Will transition to oral antibiotics on discharge and continue dressing changes  Active Problems: Bilateral lower extremity edema, acute systolic and diastolic CHF Moderate to severe aortic stenosis -Lower extremity edema with BNP 1698 with mildly elevated troponins on admission -Venous Dopplers that showed no DVT, positive for superficial subcutaneous edema may be related to volume overload - 2D echo showed EF of 45 to 50%, global hypokinesis, moderate concentric LVH, G2 DD, normal right ventricular systolic function, mild LA, mild MR, mild to moderate aortic stenosis.  No prior echo to compare with - On triamterene -HCTZ prior to admission, received  Lasix  during hospitalization. -Creatinine 2.1, hold off on further diuresis given creatinine trended up and aortic stenosis. -Cardiology consulted, awaiting recommendations  History of gout - Has been on allopurinol  and is on outpatient, will continue    Cancer of prostate (HCC) -Status post radiation therapy, outpatient follow-up with urology/oncology   Mild acute on chronic kidney disease, stage 3 unspecified (HCC)   -Baseline creatinine 1.6-1.8 - Presented with creatinine of 1.9, will likely trend up with IV Lasix , follow closely   Obesity class II Estimated body mass index is 35.43 kg/m as calculated from the following:   Height as of this encounter: 5' 8 (1.727 m).   Weight as of this encounter: 105.7 kg.  Code Status: Full code DVT Prophylaxis:  enoxaparin  (LOVENOX ) injection 30 mg Start: 10/06/23 2200   Level of Care: Level of care: Telemetry Family Communication: Updated patient's sister at the bedside on 9/24 Disposition Plan:      Remains inpatient appropriate: Hopefully DC home today or tomorrow  Procedures:  2D echo  Consultants:   Cardiology  Antimicrobials:   Anti-infectives (From admission, onward)  Start     Dose/Rate Route Frequency Ordered Stop   10/04/23 1200  cefTRIAXone  (ROCEPHIN ) 1 g in sodium chloride  0.9 % 100 mL IVPB        1 g 200 mL/hr over 30 Minutes Intravenous Every 24 hours 10/03/23 2113     10/03/23 1130  cefTRIAXone  (ROCEPHIN ) 2 g in sodium chloride  0.9 % 100 mL IVPB        2 g 200 mL/hr over 30 Minutes Intravenous  Once 10/03/23 1121 10/03/23 1258          Medications  allopurinol   100 mg Oral Daily   enoxaparin  (LOVENOX ) injection  30 mg Subcutaneous Q24H   predniSONE   5 mg Oral Q breakfast      Subjective:   Christopher [name] was seen and examined today.  Cellulitis and edema has significantly improved.  No chest pain, acute shortness of breath nausea vomiting, no fevers  Objective:   Vitals:   10/05/23 2033  10/06/23 0416 10/06/23 0500 10/06/23 1336  BP: 130/81 117/78  119/66  Pulse: 78 68  64  Resp: 16 17  16   Temp: 97.6 F (36.4 C) 97.7 F (36.5 C)  97.8 F (36.6 C)  TempSrc: Oral Oral  Oral  SpO2: 98% 97%  95%  Weight:   105.7 kg   Height:       No intake or output data in the 24 hours ending 10/06/23 1443    Wt Readings from Last 3 Encounters:  10/06/23 105.7 kg  10/01/23 117.5 kg  04/05/23 117 kg   Physical Exam General: Alert and oriented x 3, NAD Cardiovascular: S1 S2 clear, RRR.  SEM Respiratory: CTAB, no wheezing, rales or rhonchi Gastrointestinal: Soft, nontender, nondistended, NBS Ext: trace pedal edema bilaterally, significantly improved Neuro: no new deficits Skin: Cellulitis and wound on the left leg improving Psych: Normal affect    Left leg on admission       Data Reviewed:  I have personally reviewed following labs    CBC Lab Results  Component Value Date   WBC 7.6 10/06/2023   RBC 4.71 10/06/2023   HGB 13.7 10/06/2023   HCT 43.9 10/06/2023   MCV 93.2 10/06/2023   MCH 29.1 10/06/2023   PLT 195 10/06/2023   MCHC 31.2 10/06/2023   RDW 13.0 10/06/2023   LYMPHSABS 2.3 10/04/2023   MONOABS 0.8 10/04/2023   EOSABS 0.3 10/04/2023   BASOSABS 0.1 10/04/2023     Last metabolic panel Lab Results  Component Value Date   NA 138 10/06/2023   K 3.3 (L) 10/06/2023   CL 99 10/06/2023   CO2 24 10/06/2023   BUN 39 (H) 10/06/2023   CREATININE 2.01 (H) 10/06/2023   GLUCOSE 95 10/06/2023   GFRNONAA 31 (L) 10/06/2023   GFRAA 49 (L) 11/29/2014   CALCIUM 9.7 10/06/2023   PHOS 3.3 10/06/2023   PROT 6.3 (L) 10/04/2023   ALBUMIN  3.4 (L) 10/06/2023   BILITOT 0.8 10/04/2023   ALKPHOS 71 10/04/2023   AST 21 10/04/2023   ALT 9 10/04/2023   ANIONGAP 15 10/06/2023    CBG (last 3)  No results for input(s): GLUCAP in the last 72 hours.    Coagulation Profile: No results for input(s): INR, PROTIME in the last 168 hours.   Radiology Studies: I  have personally reviewed the imaging studies  No results found.      [name] M.D. [facility] 10/06/2023, 2:43 PM  Available via Epic secure chat 7am-7pm After 7 pm, please refer  to night coverage provider listed on amion.

## 2023-10-06 NOTE — Progress Notes (Unsigned)
 Enrolled patient for a 14 day Zio XT  monitor to be mailed to patients home

## 2023-10-08 LAB — CULTURE, BLOOD (ROUTINE X 2)
Culture: NO GROWTH
Culture: NO GROWTH
Special Requests: ADEQUATE
Special Requests: ADEQUATE

## 2023-10-14 DIAGNOSIS — E876 Hypokalemia: Secondary | ICD-10-CM | POA: Diagnosis not present

## 2023-10-17 ENCOUNTER — Encounter (HOSPITAL_BASED_OUTPATIENT_CLINIC_OR_DEPARTMENT_OTHER): Attending: Internal Medicine | Admitting: Internal Medicine

## 2023-10-17 DIAGNOSIS — I351 Nonrheumatic aortic (valve) insufficiency: Secondary | ICD-10-CM | POA: Insufficient documentation

## 2023-10-17 DIAGNOSIS — I87312 Chronic venous hypertension (idiopathic) with ulcer of left lower extremity: Secondary | ICD-10-CM | POA: Diagnosis not present

## 2023-10-17 DIAGNOSIS — S51001A Unspecified open wound of right elbow, initial encounter: Secondary | ICD-10-CM | POA: Diagnosis not present

## 2023-10-17 DIAGNOSIS — I5032 Chronic diastolic (congestive) heart failure: Secondary | ICD-10-CM | POA: Diagnosis not present

## 2023-10-17 DIAGNOSIS — W228XXA Striking against or struck by other objects, initial encounter: Secondary | ICD-10-CM | POA: Diagnosis not present

## 2023-10-17 DIAGNOSIS — L97822 Non-pressure chronic ulcer of other part of left lower leg with fat layer exposed: Secondary | ICD-10-CM | POA: Insufficient documentation

## 2023-10-17 DIAGNOSIS — T798XXA Other early complications of trauma, initial encounter: Secondary | ICD-10-CM | POA: Insufficient documentation

## 2023-10-21 DIAGNOSIS — M109 Gout, unspecified: Secondary | ICD-10-CM | POA: Diagnosis not present

## 2023-10-21 DIAGNOSIS — I493 Ventricular premature depolarization: Secondary | ICD-10-CM | POA: Diagnosis not present

## 2023-10-21 DIAGNOSIS — I350 Nonrheumatic aortic (valve) stenosis: Secondary | ICD-10-CM | POA: Diagnosis not present

## 2023-10-21 DIAGNOSIS — R609 Edema, unspecified: Secondary | ICD-10-CM | POA: Diagnosis not present

## 2023-10-21 DIAGNOSIS — I10 Essential (primary) hypertension: Secondary | ICD-10-CM | POA: Diagnosis not present

## 2023-10-21 DIAGNOSIS — L0390 Cellulitis, unspecified: Secondary | ICD-10-CM | POA: Diagnosis not present

## 2023-10-21 DIAGNOSIS — Z7409 Other reduced mobility: Secondary | ICD-10-CM | POA: Diagnosis not present

## 2023-10-21 DIAGNOSIS — I700 Atherosclerosis of aorta: Secondary | ICD-10-CM | POA: Diagnosis not present

## 2023-10-21 DIAGNOSIS — Z23 Encounter for immunization: Secondary | ICD-10-CM | POA: Diagnosis not present

## 2023-10-21 DIAGNOSIS — N1832 Chronic kidney disease, stage 3b: Secondary | ICD-10-CM | POA: Diagnosis not present

## 2023-10-24 ENCOUNTER — Encounter (HOSPITAL_BASED_OUTPATIENT_CLINIC_OR_DEPARTMENT_OTHER): Admitting: Internal Medicine

## 2023-10-24 DIAGNOSIS — T798XXA Other early complications of trauma, initial encounter: Secondary | ICD-10-CM

## 2023-10-24 DIAGNOSIS — I5032 Chronic diastolic (congestive) heart failure: Secondary | ICD-10-CM | POA: Diagnosis not present

## 2023-10-24 DIAGNOSIS — L97822 Non-pressure chronic ulcer of other part of left lower leg with fat layer exposed: Secondary | ICD-10-CM

## 2023-10-24 DIAGNOSIS — I351 Nonrheumatic aortic (valve) insufficiency: Secondary | ICD-10-CM | POA: Diagnosis not present

## 2023-10-24 DIAGNOSIS — I87312 Chronic venous hypertension (idiopathic) with ulcer of left lower extremity: Secondary | ICD-10-CM | POA: Diagnosis not present

## 2023-10-24 DIAGNOSIS — S51001A Unspecified open wound of right elbow, initial encounter: Secondary | ICD-10-CM

## 2023-10-31 ENCOUNTER — Encounter (HOSPITAL_BASED_OUTPATIENT_CLINIC_OR_DEPARTMENT_OTHER): Admitting: Internal Medicine

## 2023-10-31 DIAGNOSIS — S51001A Unspecified open wound of right elbow, initial encounter: Secondary | ICD-10-CM

## 2023-10-31 DIAGNOSIS — I87312 Chronic venous hypertension (idiopathic) with ulcer of left lower extremity: Secondary | ICD-10-CM | POA: Diagnosis not present

## 2023-10-31 DIAGNOSIS — L97822 Non-pressure chronic ulcer of other part of left lower leg with fat layer exposed: Secondary | ICD-10-CM | POA: Diagnosis not present

## 2023-10-31 DIAGNOSIS — T798XXA Other early complications of trauma, initial encounter: Secondary | ICD-10-CM

## 2023-10-31 DIAGNOSIS — I351 Nonrheumatic aortic (valve) insufficiency: Secondary | ICD-10-CM | POA: Diagnosis not present

## 2023-10-31 DIAGNOSIS — I5032 Chronic diastolic (congestive) heart failure: Secondary | ICD-10-CM | POA: Diagnosis not present

## 2023-11-03 ENCOUNTER — Ambulatory Visit: Attending: Emergency Medicine | Admitting: Emergency Medicine

## 2023-11-03 ENCOUNTER — Encounter: Payer: Self-pay | Admitting: Emergency Medicine

## 2023-11-03 VITALS — BP 128/52 | HR 61 | Ht 68.0 in | Wt 246.0 lb

## 2023-11-03 DIAGNOSIS — I5022 Chronic systolic (congestive) heart failure: Secondary | ICD-10-CM | POA: Diagnosis not present

## 2023-11-03 DIAGNOSIS — I272 Pulmonary hypertension, unspecified: Secondary | ICD-10-CM | POA: Insufficient documentation

## 2023-11-03 DIAGNOSIS — R600 Localized edema: Secondary | ICD-10-CM | POA: Insufficient documentation

## 2023-11-03 DIAGNOSIS — I350 Nonrheumatic aortic (valve) stenosis: Secondary | ICD-10-CM | POA: Diagnosis not present

## 2023-11-03 DIAGNOSIS — I493 Ventricular premature depolarization: Secondary | ICD-10-CM | POA: Insufficient documentation

## 2023-11-03 NOTE — Progress Notes (Addendum)
 Cardiology Office Note:    Date:  11/03/2023  ID:  Perl Kerney, DOB 06-18-1928, MRN [id number] PCP: [name] LABOR, MD  [facility] Providers Cardiologist:  Wilbert Bihari, MD Cardiology APP:  Rana Lum CROME, NP       Patient Profile:       Chief Complaint: Follow-up after hospitalization History of Present Illness:  Earland Reish is a 89 y.o. male with visit-pertinent history of obesity, CKD, prostate cancer thousand 5, peripheral venous insufficiency s/p saphenous ablation, frequent PVCs   On 9/20 to present to [facility] ED due to abnormal EKG seen in outpatient visit for wound care.  He was asymptomatic and noted to have frequent PVCs.  Troponin 43, 44.  He was asymptomatic in the manner his workup was unremarkable.  He was discharged with cardiology follow-up  He presented to the ED on 9/22 for worsening leg when.  Echocardiogram with LVEF 45 to 50% with mild global hypokinesis and moderate LVH, grade 2 DD, Mildly elevated PSAP, mildly dilated LA, mild MR, mild to moderate aortic stenosis though suspected to be underestimated.  He was admitted for cellulitis and started on IV antibiotics he established with cardiology service on 10/06/2023 in the hospital for evaluation of acute congestive heart failure.  Previously has not been seen by cardiologist.  In regards to his peripheral edema there has been a chronic issue since his radiation treatment for his prostate cancer.  He is mostly asymptomatic except for peripheral edema that had worsened beyond his baseline.  His proBNP is elevated but normal when age-adjusted.  Chest x-ray showed no evidence of volume overload.  It was suspected his minimally reduced EF is potentially from ectopy burden or aortic stenosis.  His troponin elevation was thought to not be consistent with ACS and most consistent with demand ischemia in the setting of cellulitis and AKI.  Outpatient monitor was placed and currently pending.   Discussed  the use of AI scribe software for clinical note transcription with the patient, who gave verbal consent to proceed.  History of Present Illness Tyree Fluharty is a 89 year old male presents for follow-up after hospitalization.  Today he presents to clinic with his sister.  He is without any acute cardiovascular concerns or complaints today.  His heart monitor is currently still pending.  The monitor was returned on the 19th just 4 days ago. He has moderate aortic stenosis with no symptoms of shortness of breath, chest pain, syncope, or lightheadedness.  Leg swelling is attributed to past radiation treatment for prostate cancer in 2005, with a history of a bypass on one of the veins. Swelling is reportedly improving with treatment at a wound center. Gout also contributes to his leg swelling.  He feels his leg swelling is well-controlled at this time.  His weight has been stable.  He denies any orthopnea, PND, palpitations, melena, hematochezia    Review of systems:  Please see the history of present illness. All other systems are reviewed and otherwise negative.      Studies Reviewed:        Echocardiogram 10/04/2023  1. Left ventricular ejection fraction, by estimation, is 45 to 50%. The  left ventricle has mildly decreased function. The left ventricle  demonstrates global hypokinesis. There is moderate concentric left  ventricular hypertrophy. Left ventricular  diastolic parameters are consistent with Grade II diastolic dysfunction  (pseudonormalization).   2. Right ventricular systolic function is normal. The right ventricular  size is normal. There is moderately elevated  pulmonary artery systolic  pressure.   3. Left atrial size was mildly dilated.   4. The mitral valve is grossly normal. Mild mitral valve regurgitation.  No evidence of mitral stenosis.   5. Decreased stroke volume index. DVI 0.35 and TFR 184. Valve visually  appears calcified with decrease motion and aortic stenosis  may be  underestimated. Largest gradient seen in the apical window. The aortic  valve is calcified. Aortic valve  regurgitation is trivial. Mild to moderate aortic valve stenosis. Aortic  valve area, by VTI measures 1.09 cm. Aortic valve mean gradient measures  17.0 mmHg. Aortic valve Vmax measures 2.62 m/s. Aortic valve acceleration  time measures 84 msec.   Risk Assessment/Calculations:              Physical Exam:   VS:  BP (!) 128/52 (BP Location: Left Arm, Patient Position: Sitting, Cuff Size: Normal)   Pulse 61   Ht 5' 8 (1.727 m)   Wt 246 lb (111.6 kg)   SpO2 95%   BMI 37.40 kg/m    Wt Readings from Last 3 Encounters:  11/03/23 246 lb (111.6 kg)  10/06/23 233 lb 0.4 oz (105.7 kg)  10/01/23 259 lb (117.5 kg)    GEN: Well nourished, well developed in no acute distress NECK: No JVD; No carotid bruits CARDIAC: RRR.  1/6 systolic murmur.  No rubs, gallops RESPIRATORY:  Clear to auscultation without rales, wheezing or rhonchi  ABDOMEN: Soft, non-tender, non-distended EXTREMITIES: Left lower extremity is wrapped with a mild edema.  No edema to RLE; No acute deformity      Assessment and Plan:  HFmrEF Lower extremity edema Moderate pulmonary hypertension History of PVD insufficiency s/p saphenous vein ablation Echocardiogram 09/2023 with LVEF 45 to 50%, moderate LVH, grade 2 DD, RV normal, and moderate PHTN with PASP estimated at 54 mmHg - Admitted 09/2023 presenting with worsening lower extremity edema and elevated BNP at 1698.  Never experienced dyspnea.  Lower extremity edema was primarily unilateral to left LE due to lower extremity cellulitis.  He received 2 doses of IV Lasix .  Chest x-ray was clear - Noted to have frequent PVCs however monitor currently pending.  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  Lower extremity swelling has improved with improvement  in his cellulitis, currently following with wound center.  He does not require daily loop diuretic therapy - He denies any dyspnea, orthopnea, PND - Avoiding ARB/ARNI/MRA and SGLT2i for now due to CKD - Avoiding beta-blocker therapy due to low resting heart rate of 61 - Will avoid adding any GDMT.  He is entirely asymptomatic, stable, and well compensated.  Can plan to repeat echocardiogram x 3 months to reassess LV function and PASP - He is currently managed on Maxide 75-50 mg daily per PCP  PVCs Admitted 09/2023 and noted to be in bigeminal PVCs as well as ventricular couplets that were very frequent but resolved on admission EKG Had hypokalemia during admission now stable at 3.9 on 10/10 - He is currently pending outpatient ZIO monitor to determine his PVC burden - Will hold off on beta-blocker therapy at this time as his resting heart rate is 61 bpm  Aortic stenosis Echocardiogram 09/2023 showing mild to moderate aortic valve stenosis with mean gradient 17 mmHg - Per [name] I personally reviewed 2D echo images and the valve is heavily calcified with reduced leaflet excursion. He definitely  has at least moderate aortic stenosis visually. - He remains asymptomatic without chest pains, dyspnea, syncope or any exertional symptoms  - He will need follow-up echocardiogram x 1 year for routine monitoring      Dispo:  Return in about 3 months (around 02/03/2024).  Signed, Lum LITTIE Louis, NP

## 2023-11-03 NOTE — Patient Instructions (Signed)
 Medication Instructions:  NO CHANGES   Lab Work: NONE TO BE DONE TODAY. SABRA  Testing/Procedures: NONE  Follow-Up: At [facility], you and your health needs are our priority.  As part of our continuing mission to provide you with exceptional heart care, our providers are all part of one team.  This team includes your primary Cardiologist (physician) and Advanced Practice Providers or APPs (Physician Assistants and Nurse Practitioners) who all work together to provide you with the care you need, when you need it.  Your next appointment:    3 MONTHS  Provider:   MADISON FOUNTAIN, NP

## 2023-11-07 ENCOUNTER — Encounter (HOSPITAL_BASED_OUTPATIENT_CLINIC_OR_DEPARTMENT_OTHER): Admitting: Internal Medicine

## 2023-11-07 DIAGNOSIS — I87312 Chronic venous hypertension (idiopathic) with ulcer of left lower extremity: Secondary | ICD-10-CM

## 2023-11-07 DIAGNOSIS — T798XXA Other early complications of trauma, initial encounter: Secondary | ICD-10-CM

## 2023-11-07 DIAGNOSIS — S51001A Unspecified open wound of right elbow, initial encounter: Secondary | ICD-10-CM

## 2023-11-07 DIAGNOSIS — L97822 Non-pressure chronic ulcer of other part of left lower leg with fat layer exposed: Secondary | ICD-10-CM

## 2023-11-07 DIAGNOSIS — I5032 Chronic diastolic (congestive) heart failure: Secondary | ICD-10-CM | POA: Diagnosis not present

## 2023-11-07 DIAGNOSIS — I351 Nonrheumatic aortic (valve) insufficiency: Secondary | ICD-10-CM | POA: Diagnosis not present

## 2023-11-08 ENCOUNTER — Ambulatory Visit: Payer: Self-pay | Admitting: Cardiology

## 2023-11-08 DIAGNOSIS — I493 Ventricular premature depolarization: Secondary | ICD-10-CM

## 2023-11-08 DIAGNOSIS — I472 Ventricular tachycardia, unspecified: Secondary | ICD-10-CM

## 2023-11-09 MED ORDER — METOPROLOL SUCCINATE ER 25 MG PO TB24: 25.0000 mg | ORAL_TABLET | Freq: Every day | ORAL | 3 refills | Status: AC

## 2023-11-09 NOTE — Telephone Encounter (Signed)
-----   Message from Wilbert Bihari sent at 11/08/2023 11:13 PM EDT ----- Patient had over 311 episodes of NSVT with frequent PVCs and a PVC load of 20.8%.  Please get him in with the EP ASAP for evaluation of NSVT.  Recent 2D echo showed EF 45 to 50%.  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  He has not had an ischemic workup given his advanced age and underlying CKD.  Please get patient back in with  Lum Louis, NP ASAP to discuss possibility of stress PET CT to rule out large area of ischemia.  Given his underlying CKD would not pursue heart cath unless he had a large area of ischemia.   Start Toprol-XL 25 mg daily ----- Message ----- From: Bihari Wilbert SAUNDERS, MD Sent: 11/08/2023  11:06 PM EDT To: Wilbert SAUNDERS Bihari, MD

## 2023-11-09 NOTE — Telephone Encounter (Signed)
 Call to patient to advise he had over 311 episodes of NSVT with frequent PVCs and a PVC load of 20.8%.  Patient agrees to  EP referral ASAP for evaluation of NSVT.   Also discussed recent 2D echo showed EF 45 to 50%.  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  Explained [name] would like him to see  Lum Louis, NP ASAP to discuss possibility of stress PET CT to rule out large area of ischemia.  Appointment made for 11/17/23. .Script for Toprol-XL 25 mg daily sent to pharmacy of choice.

## 2023-11-15 ENCOUNTER — Ambulatory Visit: Admitting: Podiatry

## 2023-11-15 ENCOUNTER — Encounter: Payer: Self-pay | Admitting: Podiatry

## 2023-11-15 ENCOUNTER — Encounter (HOSPITAL_BASED_OUTPATIENT_CLINIC_OR_DEPARTMENT_OTHER): Admitting: Internal Medicine

## 2023-11-15 DIAGNOSIS — B351 Tinea unguium: Secondary | ICD-10-CM

## 2023-11-15 DIAGNOSIS — M79674 Pain in right toe(s): Secondary | ICD-10-CM | POA: Diagnosis not present

## 2023-11-15 DIAGNOSIS — M79675 Pain in left toe(s): Secondary | ICD-10-CM | POA: Diagnosis not present

## 2023-11-15 NOTE — Progress Notes (Signed)
 Subjective:  Patient ID: Christopher [name], male    DOB: [birth year],  MRN: [id number]  Christopher [name] presents to clinic today for painful mycotic toenails of both feet that are difficult to trim. Pain interferes with daily activities and wearing enclosed shoe gear comfortably.  Patient also has chronic venous insufficiency bilaterally. Accompanied by his sister on today's visit. I point out an area of pinpoint drainage on his right leg. He nor his sister were aware of it. States it must have happened some time today. He is being seen by Wound Care, Dr. Harlene Eastern, for wound of left leg. Chief Complaint  Patient presents with   [employer]    RFC Not Diabetic PCP Christopher Dorn LABOR, Christopher [name] 09/14/23   New problem(s):        PCP is Christopher Dorn LABOR, Christopher [name].  Allergies  Allergen Reactions   Chicken Allergy Anaphylaxis and Swelling    Other reaction(s): Unknown   Macrolides And Ketolides     Other Reaction(s): Other (See Comments)  Kidney pain   Cephalexin Swelling    Swelling of lips, ulceration of the tongue. Tolerated Rocephin  10/06/23.   Mirabegron     Other reaction(s): side pain   Penicillins Itching and Swelling    Review of Systems: Negative except as noted in the HPI.  Objective: No changes noted in today's physical examination. There were no vitals filed for this visit. Christopher [name] is a pleasant 89 y.o. male morbidly obese in NAD. AAO x 3.  Vascular Examination: Capillary refill time immediate b/l. Palpable pedal pulses. Pedal hair absent b/l. No pain with calf compression b/l. Skin temperature gradient WNL b/l. No cyanosis or clubbing b/l. No ischemia or gangrene noted b/l. Dependent rubor noted b/l lower extremities. Dependent edema noted b/l LE. Lower extremity compression wrap noted LLE which is clean, dry and intact.  Neurological Examination: Sensation grossly intact b/l with 10 gram monofilament.   Dermatological Examination: Small skin tear noted lateral aspect  lower 1/3 RLE with pinpoint serosanguinous drainage.  Pedal skin with normal  texture. No interdigital macerations.   Toenails 1-5 b/l thick, discolored, elongated with subungual debris and pain on dorsal palpation.   No corns, calluses nor porokeratotic lesions noted.  Musculoskeletal Examination: Normal muscle strength 5/5 to all lower extremity muscle groups bilaterally. Pes planus deformity noted bilateral LE.SABRA No pain, crepitus or joint limitation noted with ROM b/l LE.  Utilizes cane for ambulation assistance.  Radiographs: None  Assessment/Plan: 1. Pain due to onychomycosis of toenails of both feet   Consent given for treatment. Patient examined. All patient's and/or POA's questions/concerns addressed on today's visit. Dispensed shoe recommendations for lymphedema. Cleansed  skin tear right leg with alcohol. Applied TAO and band-aid. They will notify Dr. Eastern on tomorrow's visit. Toenails 1-5 b/l debrided in length and girth without incident. Continue soft, supportive shoe gear daily. Report any pedal injuries to medical professional. Call office if there are any questions/concerns.  Return in about 3 months (around 02/15/2024).  Christopher [name], DPM      [place] LOCATION: [address], [place] [address]                   Office [phone]  [place] LOCATION: [address] [place], [place] [address] Office [phone]

## 2023-11-15 NOTE — Patient Instructions (Signed)
  Shoe Recommendations for Lymphedema:  Check out the Pedors line of shoes. They are specifically for those with edema. They have a lymphedema line of shoes. These may work for you.  The website is www.pedors.com  Styles to check out: Pedors Classic MAX Slides for Lymphedema Pedors Classic MAX Wrap Sandals for Lymphedema Pedors Super MAX Shoes for Swollen Feet Pedors Clasico MAX Negro [place] Para [place] Edema

## 2023-11-16 ENCOUNTER — Encounter (HOSPITAL_BASED_OUTPATIENT_CLINIC_OR_DEPARTMENT_OTHER): Attending: Internal Medicine | Admitting: Internal Medicine

## 2023-11-16 ENCOUNTER — Ambulatory Visit: Admitting: Emergency Medicine

## 2023-11-16 DIAGNOSIS — I5032 Chronic diastolic (congestive) heart failure: Secondary | ICD-10-CM | POA: Diagnosis not present

## 2023-11-16 DIAGNOSIS — I87312 Chronic venous hypertension (idiopathic) with ulcer of left lower extremity: Secondary | ICD-10-CM

## 2023-11-16 DIAGNOSIS — L97822 Non-pressure chronic ulcer of other part of left lower leg with fat layer exposed: Secondary | ICD-10-CM | POA: Diagnosis not present

## 2023-11-17 ENCOUNTER — Ambulatory Visit: Attending: Emergency Medicine | Admitting: Emergency Medicine

## 2023-11-17 ENCOUNTER — Encounter: Payer: Self-pay | Admitting: Emergency Medicine

## 2023-11-17 VITALS — BP 122/70 | Ht 68.0 in | Wt 248.0 lb

## 2023-11-17 DIAGNOSIS — I493 Ventricular premature depolarization: Secondary | ICD-10-CM | POA: Insufficient documentation

## 2023-11-17 DIAGNOSIS — I472 Ventricular tachycardia, unspecified: Secondary | ICD-10-CM | POA: Diagnosis not present

## 2023-11-17 DIAGNOSIS — N1832 Chronic kidney disease, stage 3b: Secondary | ICD-10-CM | POA: Diagnosis not present

## 2023-11-17 DIAGNOSIS — I350 Nonrheumatic aortic (valve) stenosis: Secondary | ICD-10-CM | POA: Insufficient documentation

## 2023-11-17 DIAGNOSIS — I2510 Atherosclerotic heart disease of native coronary artery without angina pectoris: Secondary | ICD-10-CM | POA: Insufficient documentation

## 2023-11-17 DIAGNOSIS — I872 Venous insufficiency (chronic) (peripheral): Secondary | ICD-10-CM | POA: Insufficient documentation

## 2023-11-17 DIAGNOSIS — I5022 Chronic systolic (congestive) heart failure: Secondary | ICD-10-CM | POA: Diagnosis not present

## 2023-11-17 DIAGNOSIS — I272 Pulmonary hypertension, unspecified: Secondary | ICD-10-CM | POA: Insufficient documentation

## 2023-11-17 NOTE — Patient Instructions (Addendum)
 Medication Instructions:   START TAKING :  METOPROLOL  11-20-24  *If you need a refill on your cardiac medications before your next appointment, please call your pharmacy*   Lab Work:  PLEASE GO DOWN STAIRS  [facility]  FIRST FLOOR   ( GET OFF ELEVATORS WALK TOWARDS WAITING AREA LAB LOCATED BY PHARMACY): BMET AND CBC      If you have labs (blood work) drawn today and your tests are completely normal, you will receive your results only by: MyChart Message (if you have MyChart) OR A paper copy in the mail If you have any lab test that is abnormal or we need to change your treatment, we will call you to review the results.   Testing/Procedures: you have been recommended to get  A cardiac PET scan is a medical imaging procedure that uses radioactive tracers to create detailed images of the heart and its blood flow. It helps healthcare professionals diagnose and monitor various heart conditions.    Follow-Up: At [facility], you and your health needs are our priority.  As part of our continuing mission to provide you with exceptional heart care, our providers are all part of one team.  This team includes your primary Cardiologist (physician) and Advanced Practice Providers or APPs (Physician Assistants and Nurse Practitioners) who all work together to provide you with the care you need, when you need it.  Your next appointments:   AS ALREADY  SCHEDULED    We recommend signing up for the patient portal called MyChart.  Sign up information is provided on this After Visit Summary.  MyChart is used to connect with patients for Virtual Visits (Telemedicine).  Patients are able to view lab/test results, encounter notes, upcoming appointments, etc.  Non-urgent messages can be sent to your provider as well.   To learn more about what you can do with MyChart, go to [email or web address].   Other Instructions

## 2023-11-17 NOTE — Progress Notes (Signed)
 Cardiology Office Note:    Date:  11/17/2023  ID:  Christopher [name], DOB 09-Jul-1928, MRN [id number] PCP: [name] LABOR, MD  [facility] Providers Cardiologist:  Wilbert Bihari, MD Cardiology APP:  Rana Lum CROME, NP       Patient Profile:       Chief Complaint: Acute visit for abnormal heart monitor History of Present Illness:  Christopher [name] is a 89 y.o. male with visit-pertinent history of obesity, CKD, prostate cancer, peripheral venous insufficiency s/p saphenous ablation, frequent PVCs, NSVT   On 9/20 to present to [facility] due to abnormal EKG seen in outpatient visit for wound care.  He was asymptomatic and noted to have frequent PVCs.  Troponin 43, 44.  He was asymptomatic and his workup was unremarkable.  He was discharged with cardiology follow-up.    He presented to the ED on 9/22 for worsening leg when.  Echocardiogram with LVEF 45 to 50% with mild global hypokinesis and moderate LVH, grade 2 DD, Mildly elevated PSAP, mildly dilated LA, mild MR, mild to moderate aortic stenosis though suspected to be underestimated.  He was admitted for cellulitis and started on IV antibiotics he established with cardiology service on 10/06/2023 in the hospital for evaluation of acute congestive heart failure.  Previously has not been seen by cardiologist.  In regards to his peripheral edema there has been a chronic issue since his radiation treatment for his prostate cancer.  He is mostly asymptomatic except for peripheral edema that had worsened beyond his baseline.  His proBNP is elevated but normal when age-adjusted.  Chest x-ray showed no evidence of volume overload.  His triamterene -HCTZ was placed on hold and he received IV Lasix  x 2 doses.  His creatinine peaked at 2.1 and further diuresis was held given elevated creatinine.  It was suspected his minimally reduced EF is potentially from ectopy burden or aortic stenosis.  His troponin elevation was thought to not be consistent  with ACS and most consistent with demand ischemia in the setting of cellulitis and AKI.  Outpatient monitor was placed and currently pending.  He was seen for follow-up on 11/03/2023.  He appeared euvolemic and well compensated on exam.  Lower extremity swelling improved with treatment of cellulitis.  He was entirely asymptomatic.  Avoiding ARB/ARNI/MRA and SGLT2i due to CKD.  Heart monitor results were still pending.  ZIO monitor on 10/06/2023 showed predominant rhythm was normal sinus rhythm with heart rate of 74 bpm.  There were 311 episodes of nonsustained ventricular tachycardia with the longest episode lasting 12 beats and fastest heart rate at 255 bpm.  There were 34 episodes of SVT consistent with nonsustained atrial tachycardia with the longest episode lasting 11.7 seconds with a max heart rate of 162 bpm.  Occasional PACs.  Frequent PVCs with PVC load of 20.8%.  He was started on metoprolol 25 mg daily.   Discussed the use of AI scribe software for clinical note transcription with the patient, who gave verbal consent to proceed.  History of Present Illness Christopher [name] is a 89 year old male with ventricular tachycardia and PVCs who presents for follow-up on abnormal heart monitor results. He is accompanied by his sister.  Today he is doing well without acute cardiovascular concerns or complaints.  Overall he remains entirely asymptomatic.  He has yet to start his metoprolol as he wanted to discuss this medication further.  He denies palpitations or noticeable tachycardia. His niece observes occasional fatigue and daytime somnolence, which he attributes  to disrupted nocturnal sleep. He experiences frequent nocturia but denies loud snoring. There is no recent dyspnea, chest pain, orthopnea, PND or edema.  Leg swelling has improved and he is graduated from wound center.   Review of systems:  Please see the history of present illness. All other systems are reviewed and otherwise negative.       Studies Reviewed:    EKG Interpretation Date/Time:  Thursday November 17 2023 14:58:26 EST Ventricular Rate:  82 PR Interval:  164 QRS Duration:  98 QT Interval:  418 QTC Calculation: 488 R Axis:   -2  Text Interpretation: Sinus rhythm with frequent and consecutive Premature ventricular complexes Prolonged QT When compared with ECG of 03-Oct-2023 14:33, PREVIOUS ECG IS PRESENT Confirmed by Rana Dixon [phone]) on 11/17/2023 3:03:57 PM    ZIO 11/08/2023   Predominant rhythm was normal sinus rhythm with average heart rate 74 bpm   311 episodes of nonsustained ventricular tachycardia with the longest episode lasting 12 beats and fastest heart rate 255 bpm   34 episodes of SVT consistent with nonsustained atrial tachycardia with the longest episode lasting 11.7 seconds and a max heart rate of 162 bpm   Occasional PACs, atrial couplets   Frequent PVCs with a PVC load of 20.8%, ventricular couplets and triplets as well as ventricular bigeminy and trigeminy     Patch Wear Time:  13 days and 23 hours (2025-10-05T20:08:31-0400 to 2025-10-19T20:08:23-0400)   Patient had a min HR of 50 bpm, max HR of 255 bpm, and avg HR of 74 bpm. Predominant underlying rhythm was Sinus Rhythm. Slight P wave morphology changes were noted. 311 Ventricular Tachycardia runs occurred, the run with the fastest interval lasting 6.4  secs with a max rate of 255 bpm, the longest lasting 12 beats with an avg rate of 120 bpm. 34 Supraventricular Tachycardia runs occurred, the run with the fastest interval lasting 4 beats with a max rate of 162 bpm, the longest lasting 11.7 secs with an  avg rate of 130 bpm. True duration of Supraventricular Tachycardia difficult to ascertain due to artifact. Some episodes of Supraventricular Tachycardia may be possible Atrial Tachycardia with variable block. Isolated SVEs were occasional (1.4%, 20910),  SVE Couplets were rare (<1.0%, 1269), and no SVE Triplets were present. Isolated VEs  were frequent (20.8%, [id number]), VE Couplets were occasional (3.7%, 72098), and VE Triplets were rare (<1.0%, 1217). Ventricular Bigeminy and Trigeminy were present.  Echocardiogram 10/04/2023  1. Left ventricular ejection fraction, by estimation, is 45 to 50%. The  left ventricle has mildly decreased function. The left ventricle  demonstrates global hypokinesis. There is moderate concentric left  ventricular hypertrophy. Left ventricular  diastolic parameters are consistent with Grade II diastolic dysfunction  (pseudonormalization).   2. Right ventricular systolic function is normal. The right ventricular  size is normal. There is moderately elevated pulmonary artery systolic  pressure.   3. Left atrial size was mildly dilated.   4. The mitral valve is grossly normal. Mild mitral valve regurgitation.  No evidence of mitral stenosis.   5. Decreased stroke volume index. DVI 0.35 and TFR 184. Valve visually  appears calcified with decrease motion and aortic stenosis may be  underestimated. Largest gradient seen in the apical window. The aortic  valve is calcified. Aortic valve  regurgitation is trivial. Mild to moderate aortic valve stenosis. Aortic  valve area, by VTI measures 1.09 cm. Aortic valve mean gradient measures  17.0 mmHg. Aortic valve Vmax measures 2.62 m/s. Aortic valve acceleration  time  measures 84 msec.   Risk Assessment/Calculations:              Physical Exam:   VS:  BP 122/70   Ht 5' 8 (1.727 m)   Wt 248 lb (112.5 kg)   SpO2 95%   BMI 37.71 kg/m    Wt Readings from Last 3 Encounters:  11/17/23 248 lb (112.5 kg)  11/03/23 246 lb (111.6 kg)  10/06/23 233 lb 0.4 oz (105.7 kg)    GEN: Well nourished, well developed in no acute distress NECK: No JVD; No carotid bruits CARDIAC: RRR, no murmurs, rubs, gallops RESPIRATORY:  Clear to auscultation without rales, wheezing or rhonchi  ABDOMEN: Soft, non-tender, non-distended EXTREMITIES:  No edema; No acute deformity       Assessment and Plan:  HFmrEF Moderate pulmonary hypertension Echocardiogram 09/2023 with LVEF 45 to 50%, global hypokinesis, moderate LVH, grade 2 DD, RV normal, and moderate PHTN with PASP estimated at 54 mmHg Zio 09/2023 showed 20.8% PVC burden Noted to have moderate coronary artery calcifications in all 3 coronary vessels on CTA on 02/2017 - 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  No ischemic testing was completed during admission - NYHA class I  - Today patient appears euvolemic and well compensated on exam - He denies any dyspnea, orthopnea, PND or LEE - Avoiding ARB/ARNI/MRA and SGLT2i for now due to CKD with most recent creatinine of 2.1 and GFR 31 - Given his underlying CKD will not pursue cardiac catheterization unless cardiac PET is abnormal - Plan for cardiac PET stress test to assess for large area ischemia given reduced LVEF, frequent PVCs and frequent episodes of NSVT - Venous insufficiency is currently managed on triamterene -hydrochlorothiazide  75-50 mg daily 75-50 mg daily - Start metoprolol XL 25 mg daily - BMET and CBC today   PVCs ZIO 10/2023 showed frequent PVCs with a PVC load of 20.8% - Pending referral to EP for further evaluation on 11/14 - Start metoprolol XL 25 mg daily  NSVT ZIO 10/2023 showed 311 episodes of nonsustained ventricular tachycardia with the longest episode lasting 12 beats and fastest heart rate of 255 bpm - He remains entirely asymptomatic without complaints of palpitations, tachycardia, syncope, presyncope, lightheadedness or dizziness - Pending urgent referral to EP on 11/14 - Plan for cardiac PET stress test as noted above as frequent NSVT can be indication of scar or  ischemia  Coronary artery calcification Aortic atherosclerosis CTA 02/2017 showed moderate coronary artery disease and aortic atherosclerosis No recent LDL on file no history of hyperlipidemia - Pending cardiac PET stress as noted above - Start aspirin 81 mg daily - Will hold off on statin therapy pending cardiac PET  Chronic venous insufficiency History of PVD insufficiency s/p saphenous vein ablation Has history since XRT for prostate cancer - Well-controlled - Managed on triamterene -hydrochlorothiazide  75-50 mg daily   Aortic stenosis Echocardiogram 09/2023 showing low-flow low gradient mild to moderate aortic valve stenosis with mean gradient 17 mmHg - Per [name] I personally reviewed 2D echo images and the valve is heavily calcified with reduced leaflet excursion. He definitely has at least moderate aortic stenosis visually. Although the mean gradient and V-max are in the mild range, DI and SVI are decreased. Findings most consistent  with at least moderate aortic stenosis  - He remains asymptomatic without chest pains, dyspnea, syncope or any exertional symptoms  - He will need follow-up echocardiogram x 1 year for routine monitoring  CKD stage IIIb Creatinine 2.01 and GFR 31 on 9/25 - Stable - Repeat BMET today  Hypokalemia Potassium 3.3 on 9/25 and improved to 3.9 on 10/10 - Repeat BMET today  Gout He is managed on allopurinol  and chronic prednisone  as outpatient - Management per PCP   Informed Consent   Shared Decision Making/Informed Consent The risks [chest pain, shortness of breath, cardiac arrhythmias, dizziness, blood pressure fluctuations, myocardial infarction, stroke/transient ischemic attack, nausea, vomiting, allergic reaction, radiation exposure, metallic taste sensation and life-threatening complications (estimated to be 1 in 10,000)], benefits (risk stratification, diagnosing coronary artery disease, treatment guidance) and alternatives of a cardiac PET  stress test were discussed in detail with Mr. Oakland and he agrees to proceed.     Dispo: Follow-up with Dr. Nancey (electrophysiology) x 1 week as scheduled.  Follow-up with [name] or myself x 2 months.  Signed, Lum LITTIE Louis, NP

## 2023-11-18 ENCOUNTER — Ambulatory Visit: Payer: Self-pay | Admitting: Emergency Medicine

## 2023-11-18 LAB — CBC
Hematocrit: 42.7 % (ref 37.5–51.0)
Hemoglobin: 14.5 g/dL (ref 13.0–17.7)
MCH: 31 pg (ref 26.6–33.0)
MCHC: 34 g/dL (ref 31.5–35.7)
MCV: 91 fL (ref 79–97)
Platelets: 265 10*3/uL (ref 150–450)
RBC: 4.68 x10E6/uL (ref 4.14–5.80)
RDW: 13.3 % (ref 11.6–15.4)
WBC: 10.5 10*3/uL (ref 3.4–10.8)

## 2023-11-18 LAB — BASIC METABOLIC PANEL WITH GFR
BUN/Creatinine Ratio: 23 (ref 10–24)
BUN: 40 mg/dL — ABNORMAL HIGH (ref 10–36)
CO2: 23 mmol/L (ref 20–29)
Calcium: 9.8 mg/dL (ref 8.6–10.2)
Chloride: 100 mmol/L (ref 96–106)
Creatinine, Ser: 1.77 mg/dL — ABNORMAL HIGH (ref 0.76–1.27)
Glucose: 80 mg/dL (ref 70–99)
Potassium: 4.1 mmol/L (ref 3.5–5.2)
Sodium: 138 mmol/L (ref 134–144)
eGFR: 35 mL/min/{1.73_m2} — ABNORMAL LOW

## 2023-11-24 ENCOUNTER — Ambulatory Visit

## 2023-11-24 NOTE — Progress Notes (Unsigned)
  Electrophysiology Office Note:    Date:  11/25/2023   ID:  Ollis Daudelin, DOB 1928-07-07, MRN [id number]  PCP:  [name] LABOR, MD   [facility] Providers Cardiologist:  Wilbert Bihari, MD Cardiology APP:  Rana Lum CROME, NP     Referring MD: Bihari Wilbert SAUNDERS, MD   History of Present Illness:    Christopher [name] is a 89 y.o. male with a medical history significant for frequent PVCs, nonsustained VT, CHFmrEF, referred for arrhythmia management.      Discussed the use of AI scribe software for clinical note transcription with the patient, who gave verbal consent to proceed.  History of Present Illness  He has a history of PVCs, sometimes occurring in runs.  He wore a monitor recently that showed a 20% burden of PVCs.  The longest nonsustained VT was approximately 6 seconds at a rate of 120 bpm.  He was prescribed metoprolol but did not take it this morning.  He did not notice any change --for better or worse with metoprolol.  Persistent ankle swelling has been present since radiation treatment for prostate cancer in 2005, with an unsuccessful vein transfer attempt.           Today, he reports he feels at baseline and has no acute complaints  EKGs/Labs/Other Studies Reviewed Today:     Echocardiogram:  TTE September 2025 LV EF 45 to 50%.  Moderate concentric LVH.  Grade 2 diastolic dysfunction.  Normal RV.  Mildly elevated left atrial size.  Calcified aortic valve with trivial aortic regurgitation and mild to moderate aortic stenosis   Monitors:  14 day monitor 2025-- my interpretation Sinus rhythm, heart rate 55 to 108 bpm, average 74 PVC burden 20.8%.  Over 300 episodes of nonsustained VT, longest was 6.4 seconds at a rate of 120 bpm.   EKG:   EKG Interpretation Date/Time:  Friday November 25 2023 11:41:57 EST Ventricular Rate:  85 PR Interval:  176 QRS Duration:  96 QT Interval:  390 QTC Calculation: 464 R Axis:   5  Text  Interpretation: Sinus rhythm with frequent, PACs and multifocal PVCs Incomplete right bundle branch block Minimal voltage criteria for LVH, may be normal variant ( R in aVL ) Nonspecific ST abnormality When compared with ECG of 25-Nov-2023 11:41, No significant change Confirmed by Nancey Scotts [phone]) on 11/25/2023 11:46:51 AM     Physical Exam:    VS:  BP 110/64 (BP Location: Right Arm, Patient Position: Sitting, Cuff Size: Large)   Pulse 82   Ht 5' 8 (1.727 m)   Wt 247 lb (112 kg)   SpO2 94%   BMI 37.56 kg/m     Wt Readings from Last 3 Encounters:  11/25/23 247 lb (112 kg)  11/17/23 248 lb (112.5 kg)  11/03/23 246 lb (111.6 kg)     GEN: Well nourished, well developed in no acute distress CARDIAC: RRR, no murmurs, rubs, gallops RESPIRATORY:  Normal work of breathing MUSCULOSKELETAL: 3+ edema    ASSESSMENT & PLAN:     Frequent PVCs Ischemic evaluation needed -- cardiac PET planned PVCs are multimorphic Given his advanced age, lack of symptoms, I think the risk of antiarrhythmic drug would outweigh the benefit. Continue metoprolol XL 25mg  daily Try magnesium  taurate -- 400mg  magnesium  daily  Aortic stenosis Moderate?  CKD stage III   Dependent edema Long-standing, due to venous insufficiency    Signed, Scotts FORBES Nancey, MD  11/25/2023 12:05 PM    [facility]

## 2023-11-25 ENCOUNTER — Ambulatory Visit: Attending: Cardiovascular Disease | Admitting: Cardiovascular Disease

## 2023-11-25 ENCOUNTER — Encounter: Payer: Self-pay | Admitting: Cardiovascular Disease

## 2023-11-25 VITALS — BP 110/64 | HR 82 | Ht 68.0 in | Wt 247.0 lb

## 2023-11-25 DIAGNOSIS — I5022 Chronic systolic (congestive) heart failure: Secondary | ICD-10-CM | POA: Diagnosis present

## 2023-11-25 DIAGNOSIS — I493 Ventricular premature depolarization: Secondary | ICD-10-CM | POA: Diagnosis not present

## 2023-11-25 DIAGNOSIS — I472 Ventricular tachycardia, unspecified: Secondary | ICD-10-CM | POA: Insufficient documentation

## 2023-11-25 MED ORDER — MAGNESIUM 400 MG PO TABS: 400.0000 mg | ORAL_TABLET | Freq: Every day | ORAL | Status: AC

## 2023-11-25 NOTE — Patient Instructions (Signed)
 Medication Instructions:  Your physician has recommended you make the following change in your medication:   ** Begin Magnesium  Taurate 400mg  daily - you may need to purchase this from [facility]  *If you need a refill on your cardiac medications before your next appointment, please call your pharmacy*  Lab Work: None ordered.  If you have labs (blood work) drawn today and your tests are completely normal, you will receive your results only by: MyChart Message (if you have MyChart) OR A paper copy in the mail If you have any lab test that is abnormal or we need to change your treatment, we will call you to review the results.  Testing/Procedures: None ordered.   Follow-Up: At [facility], you and your health needs are our priority.  As part of our continuing mission to provide you with exceptional heart care, our providers are all part of one team.  This team includes your primary Cardiologist (physician) and Advanced Practice Providers or APPs (Physician Assistants and Nurse Practitioners) who all work together to provide you with the care you need, when you need it.  Your next appointment:    Follow up as needed with Dr Nancey

## 2023-11-30 ENCOUNTER — Telehealth: Payer: Self-pay | Admitting: Emergency Medicine

## 2023-11-30 NOTE — Telephone Encounter (Signed)
 Pt's relative Christopher [name] is requesting a callback regarding her wanting to know if the NM PET CT is necessary right now or is it something that can wait. Please advise.

## 2023-11-30 NOTE — Telephone Encounter (Signed)
 I spoke with Devere.  She is asking if patient needs to have cardiac PET test at this time.  I gave her information from [facility] and Dr Marko notes that this is for an ischemic evaluation due to reduced LVEF, frequent PVCs and frequent episodes of NSVT.  Devere is asking about possible treatments that would be recommended after test and I explained this would be based on results of tests.  Devere would like to be in town when test is done and will try to make arrangements to be here

## 2023-12-05 ENCOUNTER — Emergency Department (HOSPITAL_COMMUNITY)

## 2023-12-05 ENCOUNTER — Other Ambulatory Visit: Payer: Self-pay

## 2023-12-05 ENCOUNTER — Encounter (HOSPITAL_COMMUNITY): Payer: Self-pay | Admitting: Emergency Medicine

## 2023-12-05 ENCOUNTER — Inpatient Hospital Stay (HOSPITAL_COMMUNITY)
Admission: EM | Admit: 2023-12-05 | Discharge: 2023-12-07 | DRG: 068 | Disposition: A | Discharge status: Home / Self Care | Attending: Internal Medicine | Admitting: Internal Medicine

## 2023-12-05 DIAGNOSIS — Z881 Allergy status to other antibiotic agents status: Secondary | ICD-10-CM

## 2023-12-05 DIAGNOSIS — E669 Obesity, unspecified: Secondary | ICD-10-CM | POA: Diagnosis present

## 2023-12-05 DIAGNOSIS — Z91018 Allergy to other foods: Secondary | ICD-10-CM

## 2023-12-05 DIAGNOSIS — H539 Unspecified visual disturbance: Principal | ICD-10-CM

## 2023-12-05 DIAGNOSIS — I5032 Chronic diastolic (congestive) heart failure: Secondary | ICD-10-CM | POA: Diagnosis present

## 2023-12-05 DIAGNOSIS — Z923 Personal history of irradiation: Secondary | ICD-10-CM

## 2023-12-05 DIAGNOSIS — N1832 Chronic kidney disease, stage 3b: Secondary | ICD-10-CM | POA: Diagnosis present

## 2023-12-05 DIAGNOSIS — I6501 Occlusion and stenosis of right vertebral artery: Principal | ICD-10-CM | POA: Diagnosis present

## 2023-12-05 DIAGNOSIS — I130 Hypertensive heart and chronic kidney disease with heart failure and stage 1 through stage 4 chronic kidney disease, or unspecified chronic kidney disease: Secondary | ICD-10-CM | POA: Diagnosis present

## 2023-12-05 DIAGNOSIS — Z8546 Personal history of malignant neoplasm of prostate: Secondary | ICD-10-CM

## 2023-12-05 DIAGNOSIS — Z888 Allergy status to other drugs, medicaments and biological substances status: Secondary | ICD-10-CM

## 2023-12-05 DIAGNOSIS — Z96641 Presence of right artificial hip joint: Secondary | ICD-10-CM | POA: Diagnosis present

## 2023-12-05 DIAGNOSIS — Z87891 Personal history of nicotine dependence: Secondary | ICD-10-CM

## 2023-12-05 DIAGNOSIS — Z79899 Other long term (current) drug therapy: Secondary | ICD-10-CM

## 2023-12-05 DIAGNOSIS — M1A9XX0 Chronic gout, unspecified, without tophus (tophi): Secondary | ICD-10-CM | POA: Diagnosis present

## 2023-12-05 DIAGNOSIS — Z880 Allergy status to penicillin: Secondary | ICD-10-CM

## 2023-12-05 DIAGNOSIS — I493 Ventricular premature depolarization: Secondary | ICD-10-CM | POA: Diagnosis present

## 2023-12-05 DIAGNOSIS — Z7982 Long term (current) use of aspirin: Secondary | ICD-10-CM

## 2023-12-05 DIAGNOSIS — H5461 Unqualified visual loss, right eye, normal vision left eye: Secondary | ICD-10-CM | POA: Diagnosis present

## 2023-12-05 DIAGNOSIS — I429 Cardiomyopathy, unspecified: Secondary | ICD-10-CM | POA: Diagnosis present

## 2023-12-05 DIAGNOSIS — R930 Abnormal findings on diagnostic imaging of skull and head, not elsewhere classified: Secondary | ICD-10-CM | POA: Diagnosis not present

## 2023-12-05 DIAGNOSIS — Z6837 Body mass index (BMI) 37.0-37.9, adult: Secondary | ICD-10-CM

## 2023-12-05 DIAGNOSIS — Z7952 Long term (current) use of systemic steroids: Secondary | ICD-10-CM

## 2023-12-05 DIAGNOSIS — Z8419 Family history of other disorders of kidney and ureter: Secondary | ICD-10-CM

## 2023-12-05 DIAGNOSIS — I350 Nonrheumatic aortic (valve) stenosis: Secondary | ICD-10-CM | POA: Diagnosis present

## 2023-12-05 DIAGNOSIS — I472 Ventricular tachycardia, unspecified: Secondary | ICD-10-CM | POA: Diagnosis present

## 2023-12-05 LAB — COMPREHENSIVE METABOLIC PANEL WITH GFR
ALT: 11 U/L (ref 0–44)
AST: 25 U/L (ref 15–41)
Albumin: 3.8 g/dL (ref 3.5–5.0)
Alkaline Phosphatase: 69 U/L (ref 38–126)
Anion gap: 11 (ref 5–15)
BUN: 38 mg/dL — ABNORMAL HIGH (ref 8–23)
CO2: 27 mmol/L (ref 22–32)
Calcium: 10 mg/dL (ref 8.9–10.3)
Chloride: 102 mmol/L (ref 98–111)
Creatinine, Ser: 1.86 mg/dL — ABNORMAL HIGH (ref 0.61–1.24)
GFR, Estimated: 33 mL/min — ABNORMAL LOW (ref >60)
Glucose, Bld: 91 mg/dL (ref 70–99)
Potassium: 3.7 mmol/L (ref 3.5–5.1)
Sodium: 140 mmol/L (ref 135–145)
Total Bilirubin: 0.5 mg/dL (ref 0.0–1.2)
Total Protein: 6.9 g/dL (ref 6.5–8.1)

## 2023-12-05 LAB — DIFFERENTIAL
Abs Immature Granulocytes: 0.03 K/uL (ref 0.00–0.07)
Basophils Absolute: 0.1 K/uL (ref 0.0–0.1)
Basophils Relative: 1 %
Eosinophils Absolute: 0.5 K/uL (ref 0.0–0.5)
Eosinophils Relative: 6 %
Immature Granulocytes: 0 %
Lymphocytes Relative: 29 %
Lymphs Abs: 2.8 K/uL (ref 0.7–4.0)
Monocytes Absolute: 0.9 K/uL (ref 0.1–1.0)
Monocytes Relative: 10 %
Neutro Abs: 5.2 K/uL (ref 1.7–7.7)
Neutrophils Relative %: 54 %

## 2023-12-05 LAB — CBC
HCT: 43.1 % (ref 39.0–52.0)
Hemoglobin: 14.1 g/dL (ref 13.0–17.0)
MCH: 30.3 pg (ref 26.0–34.0)
MCHC: 32.7 g/dL (ref 30.0–36.0)
MCV: 92.7 fL (ref 80.0–100.0)
Platelets: 285 K/uL (ref 150–400)
RBC: 4.65 MIL/uL (ref 4.22–5.81)
RDW: 13.4 % (ref 11.5–15.5)
WBC: 9.5 K/uL (ref 4.0–10.5)
nRBC: 0 % (ref 0.0–0.2)

## 2023-12-05 LAB — APTT: aPTT: 39 s — ABNORMAL HIGH (ref 24–36)

## 2023-12-05 LAB — CBG MONITORING, ED: Glucose-Capillary: 92 mg/dL (ref 70–99)

## 2023-12-05 LAB — PROTIME-INR
INR: 1 (ref 0.8–1.2)
Prothrombin Time: 14.1 s (ref 11.4–15.2)

## 2023-12-05 LAB — ETHANOL: Alcohol, Ethyl (B): <15 mg/dL (ref <15)

## 2023-12-05 NOTE — ED Triage Notes (Addendum)
 Patient states Acute partial loss of vision on right eye x 4 hours ago. Patient states vision never come back since 5 pm. Patient denies headache and dizziness. No weakness on extremities. Patient denies eye pain and drainage.

## 2023-12-05 NOTE — ED Provider Triage Note (Signed)
 Emergency Medicine Provider Triage Evaluation Note  Christopher [name] , a 89 y.o. male  was evaluated in triage.  Pt complains of vision loss. Patient states 5pm onset of painless right eye vision loss. Initially described scintillating scotoma presentation followed by complete absence of vision. Now reporting a central portion of vision with a blurry circle of vision but a ring of vision loss remaining. No headache, dizziness, nausea, or vomiting. No other symptoms. Denies head strike or head impact.  Review of Systems  Positive: As above Negative: As above  Physical Exam  BP 137/79   Pulse 73   Temp 97.7 F (36.5 C) (Oral)   Resp 18   SpO2 96%  Gen:   Awake, no distress   Resp:  Normal effort  MSK:   Moves extremities without difficulty  Other:  CN II-XII intact, no motor or sensory deficits noted  Medical Decision Making  Medically screening exam initiated at 9:39 PM.  Appropriate orders placed.  Christopher [name] was informed that the remainder of the evaluation will be completed by another provider, this initial triage assessment does not replace that evaluation, and the importance of remaining in the ED until their evaluation is complete.  Discussed patient case with Dr. Lenor. Given localized symptoms and time from onset, code stroke not activated but workup initiated.   Christopher [name] A, PA-C 12/05/23 2141

## 2023-12-05 NOTE — ED Provider Notes (Signed)
 [place] EMERGENCY DEPARTMENT AT [facility] Provider Note   CSN: [id number] Arrival date & time: 12/05/23  2112     Patient presents with: Eye Problem   Christopher [name] is a 89 y.o. male.  {Add pertinent medical, surgical, social history, OB history to HPI:388} 89 year old male presents with his sister for acute vision change in the right eye. Patient states he was sitting at the table at 5pm today when he saw what looked like rain in his right eye only. Then had complete loss of vision. Now reports he can see a rhino in the center of his vision, the left visual field looks like it has a violet veil and the right visual filed is clear. No pain in the eye, no recent falls. Has had prior cataract surgery in this eye.        Prior to Admission medications   Medication Sig Start Date End Date Taking? Authorizing Provider  acetaminophen  (TYLENOL ) 325 MG tablet Take 325 mg by mouth every 6 (six) hours as needed for mild pain (pain score 1-3) or moderate pain (pain score 4-6).    [provider]  allopurinol  (ZYLOPRIM ) 100 MG tablet Take 100 mg by mouth daily.    [provider]  Magnesium  400 MG TABS Take 400 mg by mouth daily. Magnesium  Taurate OTC 11/25/23   Mealor, Augustus E, MD  metoprolol  succinate (TOPROL  XL) 25 MG 24 hr tablet Take 1 tablet (25 mg total) by mouth daily. 11/09/23   [name] Wilbert SAUNDERS, MD  OVER THE COUNTER MEDICATION daily. Swedish bitters, 2 tsp    [provider]  predniSONE  (DELTASONE ) 5 MG tablet Take 5 mg by mouth daily. 05/09/23   [provider]  triamterene -hydrochlorothiazide  (MAXZIDE ) 75-50 MG tablet Take 1 tablet by mouth daily.    [provider]    Allergies: Chicken allergy, Macrolides and ketolides, Cephalexin, Mirabegron, and Penicillins    Review of Systems Negative except as per HPI Updated Vital Signs BP 137/79   Pulse 73   Temp 97.7 F (36.5 C) (Oral)   Resp 18   SpO2 96%    Physical Exam Vitals and nursing note reviewed.  Constitutional:      General: He is not in acute distress.    Appearance: He is well-developed. He is not diaphoretic.  HENT:     Head: Normocephalic and atraumatic.     Nose: Nose normal.     Mouth/Throat:     Mouth: Mucous membranes are moist.  Eyes:     Extraocular Movements: Extraocular movements intact.     Conjunctiva/sclera: Conjunctivae normal.     Right eye: Right conjunctiva is not injected. No chemosis.    Left eye: Left conjunctiva is not injected. No chemosis.    Pupils: Pupils are equal, round, and reactive to light.     Funduscopic exam:    Right eye: Red reflex present.        Left eye: Red reflex present.    Slit lamp exam:    Right eye: No photophobia.     Left eye: No photophobia.  Pulmonary:     Effort: Pulmonary effort is normal.  Neurological:     Mental Status: He is alert and oriented to person, place, and time.  Psychiatric:        Behavior: Behavior normal.     (all labs ordered are listed, but only abnormal results are displayed) Labs Reviewed  APTT - Abnormal; Notable for the following components:  Result Value   aPTT 39 (*)    All other components within normal limits  PROTIME-INR  CBC  DIFFERENTIAL  COMPREHENSIVE METABOLIC PANEL WITH GFR  ETHANOL  URINE DRUG SCREEN  CBG MONITORING, ED    EKG: None  Radiology: No results found.  {Document cardiac monitor, telemetry assessment procedure when appropriate:32947} Procedures   Medications Ordered in the ED - No data to display    {Click here for ABCD2, HEART and other calculators REFRESH Note before signing:1}                              Medical Decision Making  ***  {Document critical care time when appropriate  Document review of labs and clinical decision tools ie CHADS2VASC2, etc  Document your independent review of radiology images and any outside records  Document your discussion with family members, caretakers  and with consultants  Document social determinants of health affecting pt's care  Document your decision making why or why not admission, treatments were needed:32947:::1}   Final diagnoses:  None    ED Discharge Orders     None

## 2023-12-06 ENCOUNTER — Encounter (HOSPITAL_COMMUNITY): Payer: Self-pay

## 2023-12-06 ENCOUNTER — Inpatient Hospital Stay (HOSPITAL_COMMUNITY)

## 2023-12-06 ENCOUNTER — Other Ambulatory Visit: Payer: Self-pay

## 2023-12-06 ENCOUNTER — Emergency Department (HOSPITAL_COMMUNITY)

## 2023-12-06 DIAGNOSIS — I10 Essential (primary) hypertension: Secondary | ICD-10-CM | POA: Diagnosis not present

## 2023-12-06 DIAGNOSIS — R29818 Other symptoms and signs involving the nervous system: Secondary | ICD-10-CM | POA: Diagnosis not present

## 2023-12-06 DIAGNOSIS — H539 Unspecified visual disturbance: Secondary | ICD-10-CM | POA: Diagnosis present

## 2023-12-06 DIAGNOSIS — Z471 Aftercare following joint replacement surgery: Secondary | ICD-10-CM | POA: Diagnosis not present

## 2023-12-06 DIAGNOSIS — N1832 Chronic kidney disease, stage 3b: Secondary | ICD-10-CM | POA: Diagnosis present

## 2023-12-06 DIAGNOSIS — N1831 Chronic kidney disease, stage 3a: Secondary | ICD-10-CM

## 2023-12-06 DIAGNOSIS — I509 Heart failure, unspecified: Secondary | ICD-10-CM | POA: Diagnosis not present

## 2023-12-06 DIAGNOSIS — H3411 Central retinal artery occlusion, right eye: Secondary | ICD-10-CM | POA: Diagnosis not present

## 2023-12-06 DIAGNOSIS — Z743 Need for continuous supervision: Secondary | ICD-10-CM | POA: Diagnosis not present

## 2023-12-06 DIAGNOSIS — I429 Cardiomyopathy, unspecified: Secondary | ICD-10-CM | POA: Diagnosis present

## 2023-12-06 DIAGNOSIS — Z8419 Family history of other disorders of kidney and ureter: Secondary | ICD-10-CM | POA: Diagnosis not present

## 2023-12-06 DIAGNOSIS — R531 Weakness: Secondary | ICD-10-CM | POA: Diagnosis not present

## 2023-12-06 DIAGNOSIS — Z7982 Long term (current) use of aspirin: Secondary | ICD-10-CM | POA: Diagnosis not present

## 2023-12-06 DIAGNOSIS — H5461 Unqualified visual loss, right eye, normal vision left eye: Secondary | ICD-10-CM | POA: Diagnosis present

## 2023-12-06 DIAGNOSIS — Z888 Allergy status to other drugs, medicaments and biological substances status: Secondary | ICD-10-CM | POA: Diagnosis not present

## 2023-12-06 DIAGNOSIS — I472 Ventricular tachycardia, unspecified: Secondary | ICD-10-CM | POA: Diagnosis present

## 2023-12-06 DIAGNOSIS — I130 Hypertensive heart and chronic kidney disease with heart failure and stage 1 through stage 4 chronic kidney disease, or unspecified chronic kidney disease: Secondary | ICD-10-CM | POA: Diagnosis present

## 2023-12-06 DIAGNOSIS — I350 Nonrheumatic aortic (valve) stenosis: Secondary | ICD-10-CM | POA: Diagnosis present

## 2023-12-06 DIAGNOSIS — Z79899 Other long term (current) drug therapy: Secondary | ICD-10-CM | POA: Diagnosis not present

## 2023-12-06 DIAGNOSIS — E669 Obesity, unspecified: Secondary | ICD-10-CM | POA: Diagnosis present

## 2023-12-06 DIAGNOSIS — Z923 Personal history of irradiation: Secondary | ICD-10-CM | POA: Diagnosis not present

## 2023-12-06 DIAGNOSIS — R9082 White matter disease, unspecified: Secondary | ICD-10-CM | POA: Diagnosis not present

## 2023-12-06 DIAGNOSIS — Z87891 Personal history of nicotine dependence: Secondary | ICD-10-CM | POA: Diagnosis not present

## 2023-12-06 DIAGNOSIS — Z91018 Allergy to other foods: Secondary | ICD-10-CM | POA: Diagnosis not present

## 2023-12-06 DIAGNOSIS — I5032 Chronic diastolic (congestive) heart failure: Secondary | ICD-10-CM | POA: Diagnosis present

## 2023-12-06 DIAGNOSIS — I493 Ventricular premature depolarization: Secondary | ICD-10-CM | POA: Diagnosis present

## 2023-12-06 DIAGNOSIS — Z8546 Personal history of malignant neoplasm of prostate: Secondary | ICD-10-CM | POA: Diagnosis not present

## 2023-12-06 DIAGNOSIS — Z7952 Long term (current) use of systemic steroids: Secondary | ICD-10-CM | POA: Diagnosis not present

## 2023-12-06 DIAGNOSIS — G43109 Migraine with aura, not intractable, without status migrainosus: Secondary | ICD-10-CM | POA: Diagnosis not present

## 2023-12-06 DIAGNOSIS — Z880 Allergy status to penicillin: Secondary | ICD-10-CM | POA: Diagnosis not present

## 2023-12-06 DIAGNOSIS — G459 Transient cerebral ischemic attack, unspecified: Secondary | ICD-10-CM | POA: Diagnosis not present

## 2023-12-06 DIAGNOSIS — M1A9XX0 Chronic gout, unspecified, without tophus (tophi): Secondary | ICD-10-CM | POA: Diagnosis present

## 2023-12-06 DIAGNOSIS — Z881 Allergy status to other antibiotic agents status: Secondary | ICD-10-CM | POA: Diagnosis not present

## 2023-12-06 DIAGNOSIS — I6501 Occlusion and stenosis of right vertebral artery: Secondary | ICD-10-CM | POA: Diagnosis present

## 2023-12-06 DIAGNOSIS — G319 Degenerative disease of nervous system, unspecified: Secondary | ICD-10-CM | POA: Diagnosis not present

## 2023-12-06 DIAGNOSIS — Z6837 Body mass index (BMI) 37.0-37.9, adult: Secondary | ICD-10-CM | POA: Diagnosis not present

## 2023-12-06 DIAGNOSIS — Z96641 Presence of right artificial hip joint: Secondary | ICD-10-CM | POA: Diagnosis present

## 2023-12-06 LAB — URINE DRUG SCREEN
Amphetamines: NEGATIVE
Barbiturates: NEGATIVE
Benzodiazepines: NEGATIVE
Cocaine: NEGATIVE
Fentanyl: NEGATIVE
Methadone Scn, Ur: NEGATIVE
Opiates: NEGATIVE
Tetrahydrocannabinol: NEGATIVE

## 2023-12-06 LAB — CREATININE, SERUM
Creatinine, Ser: 1.6 mg/dL — ABNORMAL HIGH (ref 0.61–1.24)
GFR, Estimated: 40 mL/min — ABNORMAL LOW (ref >60)

## 2023-12-06 LAB — CBC
HCT: 43.2 % (ref 39.0–52.0)
Hemoglobin: 14 g/dL (ref 13.0–17.0)
MCH: 30.3 pg (ref 26.0–34.0)
MCHC: 32.4 g/dL (ref 30.0–36.0)
MCV: 93.5 fL (ref 80.0–100.0)
Platelets: 260 K/uL (ref 150–400)
RBC: 4.62 MIL/uL (ref 4.22–5.81)
RDW: 13.4 % (ref 11.5–15.5)
WBC: 7 K/uL (ref 4.0–10.5)
nRBC: 0 % (ref 0.0–0.2)

## 2023-12-06 LAB — LIPID PANEL
Cholesterol: 167 mg/dL (ref 0–200)
HDL: 57 mg/dL (ref >40)
LDL Cholesterol: 93 mg/dL (ref 0–99)
Total CHOL/HDL Ratio: 2.9 ratio
Triglycerides: 87 mg/dL (ref <150)
VLDL: 17 mg/dL (ref 0–40)

## 2023-12-06 LAB — HEMOGLOBIN A1C
Hgb A1c MFr Bld: 5.4 % (ref 4.8–5.6)
Mean Plasma Glucose: 108.28 mg/dL

## 2023-12-06 LAB — TSH: TSH: 2.72 u[IU]/mL (ref 0.350–4.500)

## 2023-12-06 MED ORDER — HEPARIN SODIUM (PORCINE) 5000 UNIT/ML IJ SOLN
5000.0000 [IU] | Freq: Three times a day (TID) | INTRAMUSCULAR | Status: DC
  Administered 2023-12-06 – 2023-12-07 (×3): 5000 [IU] via SUBCUTANEOUS
  Filled 2023-12-06 (×3): qty 1

## 2023-12-06 MED ORDER — PREDNISONE 5 MG PO TABS
5.0000 mg | ORAL_TABLET | Freq: Every day | ORAL | Status: DC
  Administered 2023-12-06 – 2023-12-07 (×2): 5 mg via ORAL
  Filled 2023-12-06 (×2): qty 1

## 2023-12-06 MED ORDER — METOPROLOL SUCCINATE ER 25 MG PO TB24
25.0000 mg | ORAL_TABLET | Freq: Every day | ORAL | Status: DC
  Administered 2023-12-06 – 2023-12-07 (×2): 25 mg via ORAL
  Filled 2023-12-06 (×2): qty 1

## 2023-12-06 MED ORDER — SODIUM CHLORIDE 0.9 % IV SOLN: INTRAVENOUS | Status: AC

## 2023-12-06 MED ORDER — ASPIRIN 325 MG PO TABS
325.0000 mg | ORAL_TABLET | Freq: Every day | ORAL | Status: DC
  Administered 2023-12-06: 325 mg via ORAL
  Filled 2023-12-06: qty 1

## 2023-12-06 MED ORDER — SENNOSIDES-DOCUSATE SODIUM 8.6-50 MG PO TABS: 1.0000 | ORAL_TABLET | Freq: Every evening | ORAL | Status: DC | PRN

## 2023-12-06 MED ORDER — ASPIRIN 81 MG PO TBEC
81.0000 mg | DELAYED_RELEASE_TABLET | Freq: Every day | ORAL | Status: DC
  Administered 2023-12-07: 81 mg via ORAL
  Filled 2023-12-06: qty 1

## 2023-12-06 MED ORDER — OXYCODONE HCL 5 MG PO TABS: 5.0000 mg | ORAL_TABLET | ORAL | Status: DC | PRN

## 2023-12-06 MED ORDER — IPRATROPIUM-ALBUTEROL 0.5-2.5 (3) MG/3ML IN SOLN: 3.0000 mL | RESPIRATORY_TRACT | Status: DC | PRN

## 2023-12-06 MED ORDER — ACETAMINOPHEN 325 MG PO TABS
650.0000 mg | ORAL_TABLET | Freq: Four times a day (QID) | ORAL | Status: DC | PRN
  Filled 2023-12-06: qty 2

## 2023-12-06 MED ORDER — BISACODYL 5 MG PO TBEC: 5.0000 mg | DELAYED_RELEASE_TABLET | Freq: Every day | ORAL | Status: DC | PRN

## 2023-12-06 MED ORDER — ASPIRIN 81 MG PO TBEC: 81.0000 mg | DELAYED_RELEASE_TABLET | Freq: Every day | ORAL | Status: DC

## 2023-12-06 MED ORDER — ACETAMINOPHEN 650 MG RE SUPP: 650.0000 mg | Freq: Four times a day (QID) | RECTAL | Status: DC | PRN

## 2023-12-06 MED ORDER — IOHEXOL 350 MG/ML SOLN
60.0000 mL | Freq: Once | INTRAVENOUS | Status: AC | PRN
  Administered 2023-12-06: 60 mL via INTRAVENOUS

## 2023-12-06 MED ORDER — LABETALOL HCL 5 MG/ML IV SOLN: 10.0000 mg | INTRAVENOUS | Status: DC | PRN

## 2023-12-06 MED ORDER — CLOPIDOGREL BISULFATE 75 MG PO TABS
75.0000 mg | ORAL_TABLET | Freq: Every day | ORAL | Status: DC
  Administered 2023-12-06 – 2023-12-07 (×2): 75 mg via ORAL
  Filled 2023-12-06 (×2): qty 1

## 2023-12-06 NOTE — Progress Notes (Signed)
 PT Cancellation Note  Patient Details Name: Laron Boorman MRN: [id number] DOB: 10-04-1928   Cancelled Treatment:    Reason Eval/Treat Not Completed: Patient at procedure or test/unavailable (transporters just arrived to take pt to [facility]. Pt to be seen there.)  Sylvan Delon Copp PT 12/06/2023  Acute Rehabilitation Services  Office [phone]

## 2023-12-06 NOTE — ED Notes (Signed)
 Ted hoes re applied

## 2023-12-06 NOTE — H&P (Signed)
 History and Physical    Christopher [name] FMW:[id number] DOB: 25-Apr-1928 DOA: 12/05/2023  PCP: [name] LABOR, MD Patient coming from: Home  Chief Complaint: Visual disturbance  HPI: Christopher [name] is a 89 y.o. male with medical history significant of NSVT, CHF EF 50%, HTN, CKD 3 AA comes to the hospital with complaints of visual changes in the right eye.  Patient states yesterday around 5 PM he started noticing rain like droplets in his right eye followed by complete vision loss.  He came to the hospital and this persisted until he had a CT scan but right after his vision returned back to normal.  Denies having any previous similar symptoms or CVA in the past.  In the ER CT head and CTA head and neck did not show any acute pathology but did show moderate right-sided vertebral artery stenosis. Neurology team was consulted who recommended transferring patient to [facility].  I saw the patient this morning he was asymptomatic and felt at his baseline I also updated his sister   Review of Systems: As per HPI otherwise 10 point review of systems negative.  Review of Systems Otherwise negative except as per HPI, including: General: Denies fever, chills, night sweats or unintended weight loss. Resp: Denies cough, wheezing, shortness of breath. Cardiac: Denies chest pain, palpitations, orthopnea, paroxysmal nocturnal dyspnea. GI: Denies abdominal pain, nausea, vomiting, diarrhea or constipation GU: Denies dysuria, frequency, hesitancy or incontinence MS: Denies muscle aches, joint pain or swelling Neuro: Right sided vision loss Psych: Denies anxiety, depression, SI/HI/AVH Skin: Denies new rashes or lesions ID: Denies sick contacts, exotic exposures, travel  Past Medical History:  Diagnosis Date   Bruises easily    both hands   Cancer (HCC) 2005   prostate cancer treated with external beam radiation and radioactive seeds   Cataracts, bilateral    immature   Chronic kidney  disease    Complication of anesthesia    states he was given too much anesthesia during his knee replacement   Constipation    takes Colace daily as needed   Decreased hearing    Diverticulitis 2003   DJD (degenerative joint disease)    thumbs and knees   History of colon polyps    benign   History of gout    Joint pain    Joint swelling    Leg cramps    no meds   Peripheral edema    takes Maxzide  daily   Pneumonia    hx of-15+yrs ago   Ringing in ears    UTI (lower urinary tract infection)    just completed antibiotic on 11/14/14    Past Surgical History:  Procedure Laterality Date   APPENDECTOMY  1941   CATARACT EXTRACTION W/PHACO Right 10/05/2017   Procedure: CATARACT EXTRACTION PHACO AND INTRAOCULAR LENS PLACEMENT (IOC) RIGHT;  Surgeon: Mittie Gaskin, MD;  Location: [facility];  Service: Ophthalmology;  Laterality: Right;  requests arrival time to be after 10am   CATARACT EXTRACTION W/PHACO Left 10/26/2017   Procedure: CATARACT EXTRACTION PHACO AND INTRAOCULAR LENS PLACEMENT (IOC) LEFT;  Surgeon: Mittie Gaskin, MD;  Location: [facility];  Service: Ophthalmology;  Laterality: Left;   COLONOSCOPY     ENDOVENOUS ABLATION SAPHENOUS VEIN W/ LASER  12-01-2011   left greater saphenous vein by Krystal Doing MD    ENDOVENOUS ABLATION SAPHENOUS VEIN W/ LASER  12-22-2011   right greater saphenous vein    by Krystal Doing MD   HAND SURGERY  HEMORRHOID SURGERY  1970   PARTIAL COLECTOMY     prostate seeds      removal of chest wall tumor     sigmoid colon resection     TOTAL HIP ARTHROPLASTY Right 11/26/2014   Procedure: TOTAL HIP ARTHROPLASTY;  Surgeon: Maude LELON Right, MD;  Location: [facility] OR;  Service: Orthopedics;  Laterality: Right;   TOTAL KNEE ARTHROPLASTY  08-2010    SOCIAL HISTORY:  reports that he quit smoking about 67 years ago. His smoking use included cigarettes. He started smoking about 79 years ago. He has a 12 pack-year smoking history.  He has never used smokeless tobacco. He reports that he does not drink alcohol and does not use drugs.  Allergies  Allergen Reactions   Chicken Allergy Anaphylaxis and Swelling    Other reaction(s): Unknown   Macrolides And Ketolides     Other Reaction(s): Other (See Comments)  Kidney pain   Cephalexin Swelling    Swelling of lips, ulceration of the tongue. Tolerated Rocephin  10/06/23.   Mirabegron     Other reaction(s): side pain   Penicillins Itching and Swelling    FAMILY HISTORY: Family History  Problem Relation Age of Onset   Kidney disease Mother      Prior to Admission medications   Medication Sig Start Date End Date Taking? Authorizing Provider  allopurinol  (ZYLOPRIM ) 100 MG tablet Take 100 mg by mouth daily.   Yes [provider]  Magnesium  400 MG TABS Take 400 mg by mouth daily. Magnesium  Taurate OTC 11/25/23  Yes Mealor, Augustus E, MD  metoprolol  succinate (TOPROL  XL) 25 MG 24 hr tablet Take 1 tablet (25 mg total) by mouth daily. 11/09/23  Yes Turner, Wilbert SAUNDERS, MD  predniSONE  (DELTASONE ) 5 MG tablet Take 5 mg by mouth daily. 05/09/23  Yes [provider]  triamterene -hydrochlorothiazide  (MAXZIDE ) 75-50 MG tablet Take 1 tablet by mouth daily.   Yes [provider]    Physical Exam: Vitals:   12/06/23 0559 12/06/23 0730 12/06/23 0916 12/06/23 0919  BP:  125/74 121/75   Pulse:  (!) 57 64   Resp:  14    Temp:    (!) 97.5 F (36.4 C)  TempSrc:      SpO2: 100% 97%        Constitutional: NAD, calm, comfortable Eyes: PERRL, lids and conjunctivae normal ENMT: Mucous membranes are moist. Posterior pharynx clear of any exudate or lesions.Normal dentition.  Neck: normal, supple, no masses, no thyromegaly Respiratory: clear to auscultation bilaterally, no wheezing, no crackles. Normal respiratory effort. No accessory muscle use.  Cardiovascular: Regular rate and rhythm, no murmurs / rubs / gallops.  Chronic bilateral lower extremity pitting  edema Abdomen: no tenderness, no masses palpated. No hepatosplenomegaly. Bowel sounds positive.  Musculoskeletal: no clubbing / cyanosis. No joint deformity upper and lower extremities. Good ROM, no contractures. Normal muscle tone.  Skin: no rashes, lesions, ulcers. No induration Neurologic: CN 2-12 grossly intact. Sensation intact, DTR normal. Strength 5/5 in all 4.  Psychiatric: Normal judgment and insight. Alert and oriented x 3. Normal mood.    There is no height or weight on file to calculate BMI.      Labs on Admission: I have personally reviewed following labs and imaging studies  CBC: Recent Labs  Lab 12/05/23 2136  WBC 9.5  NEUTROABS 5.2  HGB 14.1  HCT 43.1  MCV 92.7  PLT 285   Basic Metabolic Panel: Recent Labs  Lab 12/05/23 2136  NA 140  K  3.7  CL 102  CO2 27  GLUCOSE 91  BUN 38*  CREATININE 1.86*  CALCIUM  10.0   GFR: Estimated Creatinine Clearance: 30.1 mL/min (A) (by C-G formula based on SCr of 1.86 mg/dL (H)). Liver Function Tests: Recent Labs  Lab 12/05/23 2136  AST 25  ALT 11  ALKPHOS 69  BILITOT 0.5  PROT 6.9  ALBUMIN  3.8   No results for input(s): LIPASE, AMYLASE in the last 168 hours. No results for input(s): AMMONIA in the last 168 hours. Coagulation Profile: Recent Labs  Lab 12/05/23 2136  INR 1.0   Cardiac Enzymes: No results for input(s): CKTOTAL, CKMB, CKMBINDEX, TROPONINI in the last 168 hours. BNP (last 3 results) Recent Labs    10/03/23 1139  PROBNP 1,698.0*   HbA1C: No results for input(s): HGBA1C in the last 72 hours. CBG: Recent Labs  Lab 12/05/23 2144  GLUCAP 92   Lipid Profile: No results for input(s): CHOL, HDL, LDLCALC, TRIG, CHOLHDL, LDLDIRECT in the last 72 hours. Thyroid  Function Tests: No results for input(s): TSH, T4TOTAL, FREET4, T3FREE, THYROIDAB in the last 72 hours. Anemia Panel: No results for input(s): VITAMINB12, FOLATE, FERRITIN, TIBC, IRON,  RETICCTPCT in the last 72 hours. Urine analysis:    Component Value Date/Time   COLORURINE YELLOW 11/15/2014 0945   APPEARANCEUR CLEAR 11/15/2014 0945   LABSPEC 1.016 11/15/2014 0945   PHURINE 5.0 11/15/2014 0945   GLUCOSEU NEGATIVE 11/15/2014 0945   HGBUR NEGATIVE 11/15/2014 0945   BILIRUBINUR NEGATIVE 11/15/2014 0945   KETONESUR NEGATIVE 11/15/2014 0945   PROTEINUR NEGATIVE 11/15/2014 0945   UROBILINOGEN 0.2 11/15/2014 0945   NITRITE NEGATIVE 11/15/2014 0945   LEUKOCYTESUR NEGATIVE 11/15/2014 0945   Sepsis Labs: !!!!!!!!!!!!!!!!!!!!!!!!!!!!!!!!!!!!!!!!!!!! @LABRCNTIP (procalcitonin:4,lacticidven:4) )No results found for this or any previous visit (from the past 240 hours).   Radiological Exams on Admission: CT Angio Head Neck W WO CM Result Date: 12/06/2023 EXAM: CTA HEAD AND NECK WITH AND WITHOUT 12/06/2023 03:44:21 AM TECHNIQUE: CTA of the head and neck was performed with and without the administration of intravenous contrast. Multiplanar 2D and/or 3D reformatted images are provided for review. Automated exposure control, iterative reconstruction, and/or weight based adjustment of the mA/kV was utilized to reduce the radiation dose to as low as reasonably achievable. COMPARISON: None available. CLINICAL HISTORY: Neuro deficit, acute, stroke suspected FINDINGS: AORTIC ARCH AND ARCH VESSELS: No dissection or arterial injury. No significant stenosis of the brachiocephalic or subclavian arteries. CERVICAL CAROTID ARTERIES: No dissection, arterial injury, or hemodynamically significant stenosis by NASCET criteria. CERVICAL VERTEBRAL ARTERIES: Moderate right vertebral artery origin stenosis. Otherwise, no significant stenosis. LUNGS AND MEDIASTINUM: Approximately 5 mm left upper lobe pulmonary nodule.  No consolidation. SOFT TISSUES: No acute abnormality. BONES: No acute abnormality. CTA HEAD: ANTERIOR CIRCULATION: No significant stenosis of the internal carotid arteries. No significant  stenosis of the anterior cerebral arteries. No significant stenosis of the middle cerebral arteries. No aneurysm. POSTERIOR CIRCULATION: No significant stenosis of the posterior cerebral arteries. No significant stenosis of the basilar artery. No significant stenosis of the vertebral arteries. Left vertebral artery is nondominant and terminates as PICA, anatomic variant. No aneurysm. OTHER: No dural venous sinus thrombosis on this non-dedicated study. IMPRESSION: 1. No large vessel occlusion. 2. Moderate right vertebral artery origin stenosis. 3. Approximately 5 mm left upper lobe pulmonary nodule. Recommend follow up CT chest in 6-12 months if the patient is high-risk. Electronically signed by: Gilmore Molt MD 12/06/2023 03:56 AM EST RP Workstation: [id number]   CT HEAD WO CONTRAST Result Date:  12/05/2023 EXAM: CT HEAD WITHOUT CONTRAST 12/05/2023 11:00:38 PM TECHNIQUE: CT of the head was performed without the administration of intravenous contrast. Automated exposure control, iterative reconstruction, and/or weight based adjustment of the mA/kV was utilized to reduce the radiation dose to as low as reasonably achievable. COMPARISON: 07/08/2016 MRI. CLINICAL HISTORY: Partial right eye blindness. FINDINGS: BRAIN AND VENTRICLES: No acute hemorrhage. No acute infarction. Chronic atrophic changes are noted. Chronic white matter ischemic changes are seen. ORBITS: No acute abnormality noted. SINUSES: No acute abnormality. SOFT TISSUES AND SKULL: No acute soft tissue abnormality. No skull fracture. IMPRESSION: 1. Chronic atrophic changes and chronic white matter ischemic changes without acute abnormality. . Electronically signed by: Oneil Devonshire MD 12/05/2023 11:04 PM EST RP Workstation: [id number]    Nutritional status  All images have been reviewed by me personally.  EKG: Independently reviewed.   Assessment/Plan Principal Problem:   Vision changes   Right-sided vision change CVA versus stroke - CT  head and CTA head and neck are negative for acute pathology but does show moderate right-sided vertebral artery stenosis.  Will check A1c, lipid panel.  I will order aspirin  and Plavix .  Neurology team consulted, transfer patient to [facility] for further evaluation.  MRI brain ordered. -Echocardiogram September 2025 showed EF of 50%, grade 2 DD  Congestive heart failure with preserved EF Chronic bilateral lower extremity edema Moderate to severe aortic stenosis - Recent echo shows EF 50% with grade 2 DD.  We can continue his home metoprolol .  Holding off on home Maxide.  Follows with outpatient cardiology  History of NSVT - Follows outpatient EP service.  Continue metoprolol .  CKD stage III - Creatinine at baseline of 1.8  Prior history of prostate cancer - Status post radiation   DVT prophylaxis: Subcu heparin  Code Status: Full code Family Communication: Sister up to date Consults called: Neurology called, EDP spoke with Dr. Voncile Admission status: Telemetry  Telemetry admission   Time Spent: 65 minutes.  >50% of the time was devoted to discussing the patients care, assessment, plan and disposition with other care givers along with counseling the patient about the risks and benefits of treatment.    Burgess JAYSON Dare MD [facility]  If 7PM-7AM, please contact night-coverage   12/06/2023, 9:38 AM

## 2023-12-06 NOTE — Progress Notes (Signed)
 Admit for vision changes. [facility] consulted

## 2023-12-06 NOTE — ED Notes (Signed)
 Pt currently in scan. Unable to medicate at this time
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
## 2023-12-07 DIAGNOSIS — I10 Essential (primary) hypertension: Secondary | ICD-10-CM

## 2023-12-07 DIAGNOSIS — H539 Unspecified visual disturbance: Secondary | ICD-10-CM | POA: Diagnosis not present

## 2023-12-07 DIAGNOSIS — G43109 Migraine with aura, not intractable, without status migrainosus: Secondary | ICD-10-CM | POA: Diagnosis not present

## 2023-12-07 LAB — COMPREHENSIVE METABOLIC PANEL WITH GFR
ALT: 11 U/L (ref 0–44)
AST: 15 U/L (ref 15–41)
Albumin: 2.8 g/dL — ABNORMAL LOW (ref 3.5–5.0)
Alkaline Phosphatase: 47 U/L (ref 38–126)
Anion gap: 10 (ref 5–15)
BUN: 29 mg/dL — ABNORMAL HIGH (ref 8–23)
CO2: 27 mmol/L (ref 22–32)
Calcium: 9.1 mg/dL (ref 8.9–10.3)
Chloride: 103 mmol/L (ref 98–111)
Creatinine, Ser: 1.88 mg/dL — ABNORMAL HIGH (ref 0.61–1.24)
GFR, Estimated: 33 mL/min — ABNORMAL LOW (ref >60)
Glucose, Bld: 92 mg/dL (ref 70–99)
Potassium: 3.5 mmol/L (ref 3.5–5.1)
Sodium: 140 mmol/L (ref 135–145)
Total Bilirubin: 0.7 mg/dL (ref 0.0–1.2)
Total Protein: 5.8 g/dL — ABNORMAL LOW (ref 6.5–8.1)

## 2023-12-07 LAB — CBC
HCT: 38.7 % — ABNORMAL LOW (ref 39.0–52.0)
Hemoglobin: 13 g/dL (ref 13.0–17.0)
MCH: 30.8 pg (ref 26.0–34.0)
MCHC: 33.6 g/dL (ref 30.0–36.0)
MCV: 91.7 fL (ref 80.0–100.0)
Platelets: 245 K/uL (ref 150–400)
RBC: 4.22 MIL/uL (ref 4.22–5.81)
RDW: 13.4 % (ref 11.5–15.5)
WBC: 8.3 K/uL (ref 4.0–10.5)
nRBC: 0 % (ref 0.0–0.2)

## 2023-12-07 MED ORDER — ATORVASTATIN CALCIUM 40 MG PO TABS: 40.0000 mg | ORAL_TABLET | Freq: Every day | ORAL | Status: DC

## 2023-12-07 MED ORDER — ASPIRIN 81 MG PO TBEC: 81.0000 mg | DELAYED_RELEASE_TABLET | Freq: Every day | ORAL | Status: AC

## 2023-12-07 MED ORDER — ATORVASTATIN CALCIUM 40 MG PO TABS: 40.0000 mg | ORAL_TABLET | Freq: Every day | ORAL | 0 refills | Status: DC

## 2023-12-07 NOTE — Evaluation (Signed)
 Physical Therapy Evaluation Patient Details Name: Christopher [name] MRN: [id number] DOB: 09/24/1928 Today's Date: 12/07/2023  History of Present Illness  Pt is a 89 y/o M who presented to [facility] for acute partial vision loss in R eye for ~4 hours. MRI brain negative for acute findings. CTA head/neck revealed moderate R vertebral artery stenosis. PMHx: CHF (EF 50%), HTN, CKD.  Clinical Impression  PTA pt was independent for mobility in the home with use of SP cane in the community. Pt presents with R visual field deficit, impaired balance, and decreased activity tolerance. Pt was able to stand with supervision and no AD and ambulate with CGA with no challenges to balance. Pt was easily fatigued with SOB requiring standing rest breaks. When attempting to change gait speed, pt had a lateral loss of balance requiring MinA to correct. Pt scored a 14/24 on the DGI indicating pt is at an increased risk of falling. Discussed using SP cane in the home for improved stability with pt verbalizing agreement. Pt will have intermittent assist available upon d/c home. Recommending OP PT to work on balance deficits with acute PT to follow.   HR 100-110 BPM with mobility SpO2 97% on RA      If plan is discharge home, recommend the following: Assist for transportation;Help with stairs or ramp for entrance   Can travel by private vehicle    Yes    Equipment Recommendations None recommended by PT     Functional Status Assessment Patient has had a recent decline in their functional status and demonstrates the ability to make significant improvements in function in a reasonable and predictable amount of time.     Precautions / Restrictions Precautions Precautions: Fall Recall of Precautions/Restrictions: Intact Restrictions Weight Bearing Restrictions Per Provider Order: No      Mobility  Bed Mobility  General bed mobility comments: NT, received in recliner    Transfers Overall transfer level: Needs  assistance Equipment used: None Transfers: Sit to/from Stand Sit to Stand: Supervision    General transfer comment: supervision for safety, reported stiffness in LE's causing increased time to reach upright posture    Ambulation/Gait Ambulation/Gait assistance: Contact guard assist, Min assist Gait Distance (Feet): 120 Feet (x120, x60, x100) Assistive device: None Gait Pattern/deviations: Step-through pattern, Decreased stride length, Wide base of support Gait velocity: decr     General Gait Details: increased medial/lateral sway with wide BOS. Unsteady with challenges to balance requiring MinA to correct. Quick to fatigue with standing rest breaks    Balance Overall balance assessment: Needs assistance Sitting-balance support: Feet supported, No upper extremity supported Sitting balance-Leahy Scale: Good     Standing balance support: No upper extremity supported Standing balance-Leahy Scale: Poor Standing balance comment: supervision for standing balance with no challenges or UE support, MinA with challenges to balance    Standardized Balance Assessment Standardized Balance Assessment : Dynamic Gait Index   Dynamic Gait Index Level Surface: Mild Impairment Change in Gait Speed: Severe Impairment Gait with Horizontal Head Turns: Mild Impairment Gait with Vertical Head Turns: Mild Impairment Gait and Pivot Turn: Normal Step Over Obstacle: Mild Impairment Step Around Obstacles: Mild Impairment Steps: Moderate Impairment (inferenced) Total Score: 14       Pertinent Vitals/Pain Pain Assessment Pain Assessment: No/denies pain    Home Living Family/patient expects to be discharged to:: Private residence Living Arrangements: Alone   Type of Home: House Home Access: Stairs to enter Entrance Stairs-Rails:  (unilateral HR) Entrance Stairs-Number of Steps: 2 STE (typically enters  from garage) Alternate Level Stairs-Number of Steps: FF Home Layout: Two level;Full bath on  main level;Able to live on main level with bedroom/bathroom (workout room & office on 2nd floor, primarily stays on main level) Home Equipment: Cane - single point;Shower seat;Grab bars - Chartered Loss Adjuster (2 wheels);Grab bars - toilet      Prior Function Prior Level of Function : Independent/Modified Independent;Driving;History of Falls (last six months) (1 fall due to swatting a large bug in his home)    Mobility Comments: ambulates with SPC for community distances, does not use AD in the home ADLs Comments: indep with BADLs and light housework, has assist with cleaning & yardwork     Extremity/Trunk Assessment   Upper Extremity Assessment Upper Extremity Assessment: Defer to OT evaluation    Lower Extremity Assessment Lower Extremity Assessment: Overall WFL for tasks assessed    Cervical / Trunk Assessment Cervical / Trunk Assessment: Normal  Communication   Communication Communication: No apparent difficulties (verbose)    Cognition Arousal: Alert Behavior During Therapy: WFL for tasks assessed/performed   PT - Cognitive impairments: No apparent impairments    Following commands: Intact       Cueing Cueing Techniques: Verbal cues     General Comments General comments (skin integrity, edema, etc.): erythema to distal BLEs, +2 edema to B feet/legs (pt endorses this is his baseline)     PT Assessment Patient needs continued PT services  PT Problem List Decreased activity tolerance;Decreased balance;Decreased mobility;Cardiopulmonary status limiting activity       PT Treatment Interventions Stair training;DME instruction;Gait training;Functional mobility training;Therapeutic activities;Therapeutic exercise;Balance training;Neuromuscular re-education;Patient/family education    PT Goals (Current goals can be found in the Care Plan section)  Acute Rehab PT Goals Patient Stated Goal: to work on balance PT Goal Formulation: With patient Time For Goal  Achievement: 12/21/23 Potential to Achieve Goals: Good    Frequency Min 2X/week        AM-PAC PT 6 Clicks Mobility  Outcome Measure Help needed turning from your back to your side while in a flat bed without using bedrails?: None Help needed moving from lying on your back to sitting on the side of a flat bed without using bedrails?: None Help needed moving to and from a bed to a chair (including a wheelchair)?: A Little Help needed standing up from a chair using your arms (e.g., wheelchair or bedside chair)?: A Little Help needed to walk in hospital room?: A Little Help needed climbing 3-5 steps with a railing? : A Little 6 Click Score: 20    End of Session Equipment Utilized During Treatment: Gait belt Activity Tolerance: Patient tolerated treatment well Patient left: in chair;with call bell/phone within reach;with chair alarm set Nurse Communication: Mobility status PT Visit Diagnosis: Unsteadiness on feet (R26.81);Other abnormalities of gait and mobility (R26.89)    Time: 8986-8972 PT Time Calculation (min) (ACUTE ONLY): 14 min   Charges:   PT Evaluation $PT Eval Low Complexity: 1 Low   PT General Charges $$ ACUTE PT VISIT: 1 Visit       Kate ORN, PT, DPT Secure Chat Preferred  Rehab Office [phone]  Kate BRAVO Wendolyn 12/07/2023, 12:08 PM

## 2023-12-07 NOTE — Plan of Care (Signed)
  Problem: Clinical Measurements: Goal: Diagnostic test results will improve Outcome: Not Progressing   Problem: Clinical Measurements: Goal: Cardiovascular complication will be avoided Outcome: Not Progressing   Problem: Safety: Goal: Ability to remain free from injury will improve Outcome: Not Progressing   Problem: Skin Integrity: Goal: Risk for impaired skin integrity will decrease Outcome: Not Progressing

## 2023-12-07 NOTE — Progress Notes (Addendum)
 STROKE TEAM PROGRESS NOTE    INTERIM HISTORY/SUBJECTIVE  Presented with visual changes to right eye, with vision returning back to normal post CT scan.  CT/CTA/MRI negative for acute abnormality.  OBJECTIVE  Patient sitting up in chair.  Sister at bedside, thorough discussion with all questions answered.  No acute neurological events overnight.  CBC    Component Value Date/Time   WBC 8.3 12/07/2023 0114   RBC 4.22 12/07/2023 0114   HGB 13.0 12/07/2023 0114   HGB 14.5 11/17/2023 1614   HCT 38.7 (L) 12/07/2023 0114   HCT 42.7 11/17/2023 1614   PLT 245 12/07/2023 0114   PLT 265 11/17/2023 1614   MCV 91.7 12/07/2023 0114   MCV 91 11/17/2023 1614   MCH 30.8 12/07/2023 0114   MCHC 33.6 12/07/2023 0114   RDW 13.4 12/07/2023 0114   RDW 13.3 11/17/2023 1614   LYMPHSABS 2.8 12/05/2023 2136   MONOABS 0.9 12/05/2023 2136   EOSABS 0.5 12/05/2023 2136   BASOSABS 0.1 12/05/2023 2136    BMET    Component Value Date/Time   NA 140 12/07/2023 0114   NA 138 11/17/2023 1614   K 3.5 12/07/2023 0114   CL 103 12/07/2023 0114   CO2 27 12/07/2023 0114   GLUCOSE 92 12/07/2023 0114   BUN 29 (H) 12/07/2023 0114   BUN 40 (H) 11/17/2023 1614   CREATININE 1.88 (H) 12/07/2023 0114   CALCIUM  9.1 12/07/2023 0114   EGFR 35 (L) 11/17/2023 1614   GFRNONAA 33 (L) 12/07/2023 0114    IMAGING past 24 hours MR BRAIN WO CONTRAST Result Date: 12/06/2023 EXAM: MRI BRAIN WITHOUT CONTRAST 12/06/2023 10:46:50 AM TECHNIQUE: Multiplanar multisequence MRI of the head/brain was performed without the administration of intravenous contrast. COMPARISON: CT of the head dated 12/05/2023 and MRI of the head dated 07/08/2016. CLINICAL HISTORY: Transient ischemic attack (TIA). FINDINGS: BRAIN AND VENTRICLES: No acute infarct. No intracranial hemorrhage. No mass. No midline shift. No hydrocephalus. Age-related atrophy. Mild periventricular and subcortical white matter disease. The sella is unremarkable. Normal flow voids.  ORBITS: Patient is status post bilateral lens replacement. SINUSES AND MASTOIDS: Moderate opacification of the left maxillary sinus. BONES AND SOFT TISSUES: Normal marrow signal. No acute soft tissue abnormality. IMPRESSION: 1. No acute intracranial abnormality. 2. Age-related atrophy and mild periventricular and subcortical white matter disease. 3. Moderate opacification of the left maxillary sinus. Electronically signed by: Evalene Coho MD 12/06/2023 11:01 AM EST RP Workstation: [id number]    Vitals:   12/06/23 2012 12/06/23 2355 12/07/23 0300 12/07/23 0818  BP: 122/65 117/70 122/68 117/64  Pulse: (!) 58 (!) 58 65 (!) 59  Resp: 18 16 18 18   Temp: 98.2 F (36.8 C) (!) 97.4 F (36.3 C) (!) 97.4 F (36.3 C) (!) 97.5 F (36.4 C)  TempSrc: Oral Oral Oral Oral  SpO2: 95% 96% 98% 98%    PHYSICAL EXAM General:  Alert, well-nourished, well-developed patient in no acute distress CV: Regular rate and rhythm on monitor Respiratory:  Regular, unlabored respirations on room air  NEURO:  Mental Status: AA&Ox3, patient is able to give clear and coherent history Speech/Language: speech is without dysarthria or aphasia.  Naming, repetition, fluency, and comprehension intact. Excellent memory, attention.   Cranial Nerves:  II: PERRL. Minimally decreased right upper field of vision with both eyes open, improved with left eye closed.  III, IV, VI: EOMI. Eyelids elevate symmetrically.  V: Sensation is intact to light touch and symmetrical to face.  VII: Face is symmetrical resting and smiling  VIII: hearing intact to voice. IX, X: Palate elevates symmetrically. Phonation is normal.  KP:Dynloizm shrug 5/5. XII: tongue is midline without fasciculations. Motor: 5/5 strength to all muscle groups tested.  Tone: is normal and bulk is normal Sensation- Intact to light touch bilaterally. Extinction absent to light touch to DSS.   Coordination: FTN intact bilaterally, HKS: no ataxia in BLE.No drift.   Gait- deferred  Most Recent NIH: 1.    ASSESSMENT/PLAN  [name] is a 89 y.o. male with history of NSVT, CHF EF 50%, HTN, CKD 3 AA with c/o visual changes in the right eye. MRI negative for acute intracranial abnormality.  He states he still notices floaters in the upper portion of his right eye visual field, but overall his vision is nearly back to normal.  He does not have any peripheral vision deficits.  admitted for further workup.  NIH on Admission: 2.  Possible ocular Migraine, DDx including BRAO Pt stated that two days ago, his R eye seeing raindrops and then stopped followed by cloudiness over the eye except some small area overlapped with different shapes and designs. When that was over, he had blue or lavender colored tint in the eye. Whole episode lasted 1-2 hours and now resolved except very small area at upper left quadrant cloudiness which continues to improving. Denies HA or hx of migraine. Does have recent sleep deprivation.  CT Head Chronic atrophic changes and chronic white matter ischemic changes without acute abnormality CT angio Head and Neck No large vessel occlusion. Moderate right vertebral artery origin stenosis. MRI Brain No acute intracranial abnormality. 2D Echo 10/04/23: EF 45 to 50% LDL 93 HgbA1c 5.4 VTE prophylaxis - SCDs No antithrombotic prior to admission, continue aspirin  81 mg daily Therapy recommendations:  Outpatient PT/OT/ST Disposition:  home with ASAP appointment with ophthalmologist.   Hypertension Home meds: HCTZ Stable BP goal: normotension   Lipid management Home meds:  none LDL 93, goal < 100 No statin needed at this time  Other Stroke Risk Factors Advanced Age Former smoker Obesity, BMI 37. 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  Patient is OK for discharge from neurology standpoint, with recommendations as above. Follow-up with outpatient neurology in 8 weeks.    Hospital day # 1  Pt seen by Neuro NP/APP and later by MD. Note/plan to be edited by MD as needed.    Christopher JAYSON Likes, DNP, AGACNP-BC [facility] Please use AMION for contact information & EPIC for messaging.  ATTENDING NOTE: I reviewed above note and agree with the assessment and plan. Pt was seen and examined.   Wife at the bedside. Pt lying in bed, AAO x 3, no focal neuro deficit except R eye small area at left upper quadrant cloudy. He stated that two days ago, his R eye seeing raindrops and then stopped followed by cloudiness over the eye except some small area overlapped with different shapes and designs. When that was over, he had blue or lavender colored tint in the eye. Whole episode lasted 1-2 hours and now resolved except very small area at upper left quadrant cloudiness which continues to improving. Denies HA or hx of migraine. Does have recent sleep deprivation.  Overall the episode still more consistent with ocular migraine which can be triggered by sleepy deprivation lately, although the residue R eye left upper quadrant cloudiness can not rule out mild BRAO. Will need close follow up with ophthalmology ASAP. Will continue ASA for now. OK for cardiac stress test next week from neuro standpoint.  For detailed assessment and plan, please refer to above as I have made changes wherever appropriate.   Christopher Cummins, MD PhD Stroke Neurology 12/07/2023 12:39 PM    To contact Stroke Continuity provider, please refer to [email or web address]. After hours, contact General Neurology

## 2023-12-07 NOTE — TOC Transition Note (Signed)
 Transition of Care [facility]) - Discharge Note   Patient Details  Name: Christopher [name] MRN: [id number] Date of Birth: 03-05-1928  [facility] of [facility] [place] [facility] CM/SW Contact:  Andrez JULIANNA George, RN Phone Number: 12/07/2023, 12:03 PM   Clinical Narrative:     Pt is discharging home with home health services through [facility]. Information on the AVS. Pt has used [facility] in the past and asked to use them again.  Pt drives self but sister can provide some transportation until cleared by ophthalmology to drive.  Pt has walker/ cane / shower seat at home.  Pt manages his own medications without any issues.  Sister is transporting him home today.  Final next level of care: Home w Home Health Services Barriers to Discharge: No Barriers Identified   Patient Goals and CMS Choice   CMS Medicare.gov Compare Post Acute Care list provided to:: Patient Choice offered to / list presented to : Patient      Discharge Placement                       Discharge Plan and Services Additional resources added to the After Visit Summary for                            [facility] Arranged: PT, OT [facility] Agency: [facility] Date [facility] Agency Contacted: 12/07/23   Representative spoke with at [facility] Agency: [place]  Social Drivers of Health (SDOH) Interventions SDOH Screenings   Food Insecurity: No Food Insecurity (10/03/2023)  Housing: Low Risk  (10/03/2023)  Transportation Needs: No Transportation Needs (10/03/2023)  Utilities: Not At Risk (10/03/2023)  Social Connections: Moderately Isolated (10/03/2023)  Tobacco Use: Medium Risk (12/05/2023)     Readmission Risk Interventions     No data to display

## 2023-12-07 NOTE — Evaluation (Signed)
 Occupational Therapy Evaluation Patient Details Name: Christopher [name] MRN: [id number] DOB: 1928/02/08 Today's Date: 12/07/2023   History of Present Illness   Pt is a 89 y/o M who presented to [facility] for acute partial vision loss in R eye for ~4 hours. MRI brain negative for acute findings. CTA head/neck revealed moderate R vertebral artery stenosis. PMHx: CHF (EF 50%), HTN, CKD.     Clinical Impressions Pt greeted in bed, agreeable for OT visit. AOX4. PTA, pt was indep with ADLs, light IADLs, and driving. Sister lives nearby and can assist PRN upon d/c. Today, he presents with very mild R peripheral vision deficit (pt denying any other visual changes, endorsing eyes are gunky). Functionally, he is presenting near his baseline - no more than SBA for functional transfers/mobility without AD. Required max A for LB dressing, but SBA/mod I for all other ADLs. Left upright in chair, discussed safe home setup, visual compensatory strategies, and d/c recommendations with pt.   Given pt is near his baseline, no further acute needs identified. Recommend OPOT for vision.     If plan is discharge home, recommend the following:   A little help with bathing/dressing/bathroom;Assist for transportation     Functional Status Assessment         Equipment Recommendations   None recommended by OT     Recommendations for Other Services         Precautions/Restrictions   Precautions Precautions: Fall Recall of Precautions/Restrictions: Intact Restrictions Weight Bearing Restrictions Per Provider Order: No     Mobility Bed Mobility Overal bed mobility: Modified Independent             General bed mobility comments: used bed rail, incr time 2/2 stiffness    Transfers Overall transfer level: Needs assistance Equipment used: None Transfers: Sit to/from Stand, Bed to chair/wheelchair/BSC Sit to Stand: Supervision     Step pivot transfers: Supervision     General transfer  comment: stood from bed with incr effort due to stiffness, demo's good technique      Balance Overall balance assessment: Mild deficits observed, not formally tested                                         ADL either performed or assessed with clinical judgement   ADL Overall ADL's : Needs assistance/impaired Eating/Feeding: Independent   Grooming: Supervision/safety;Standing;Wash/dry hands;Wash/dry face   Upper Body Bathing: Independent   Lower Body Bathing: Contact guard assist   Upper Body Dressing : Independent   Lower Body Dressing: Maximal assistance Lower Body Dressing Details (indicate cue type and reason): assist to don B socks, pt endorses his sister assists with dressing socks at baseline Toilet Transfer: Supervision/safety;Ambulation;Regular Toilet;Grab bars           Functional mobility during ADLs: Supervision/safety;Contact guard assist       Vision Baseline Vision/History: 0 No visual deficits Ability to See in Adequate Light: 0 Adequate Patient Visual Report: No change from baseline Vision Assessment?: Yes Eye Alignment: Within Functional Limits Ocular Range of Motion: Within Functional Limits Alignment/Gaze Preference: Within Defined Limits Tracking/Visual Pursuits: Able to track stimulus in all quads without difficulty Saccades: Within functional limits Visual Fields: No apparent deficits Additional Comments: minor R peripheral vision deficit     Perception Perception: Within Functional Limits       Praxis         Pertinent Vitals/Pain Pain  Assessment Pain Assessment: No/denies pain     Extremity/Trunk Assessment Upper Extremity Assessment Upper Extremity Assessment: Overall WFL for tasks assessed           Communication Communication Communication: No apparent difficulties (verbose)   Cognition Arousal: Alert Behavior During Therapy: WFL for tasks assessed/performed Cognition: No apparent impairments              OT - Cognition Comments: very cognitively sharp                 Following commands: Intact       Cueing  General Comments      erythema to distal BLEs, +2 edema to B feet/legs (pt endorses this is his baseline)   Exercises     Shoulder Instructions      Home Living Family/patient expects to be discharged to:: Private residence Living Arrangements: Alone   Type of Home: House Home Access: Stairs to enter [employer] of Steps: 2 STE (typically enters from garage) Entrance Stairs-Rails:  (unilateral HR) Home Layout: Two level;Full bath on main level;Able to live on main level with bedroom/bathroom (workout room & office on 2nd floor, primarily stays on main level) Alternate Level Stairs-Number of Steps: FF   Bathroom Shower/Tub: Producer, Television/film/video: Handicapped height     Home Equipment: Cane - single point;Shower seat;Grab bars - Chartered Loss Adjuster (2 wheels);Grab bars - toilet          Prior Functioning/Environment Prior Level of Function : Independent/Modified Independent;Driving;History of Falls (last six months) (1 fall due to swatting a large bug in his home)             Mobility Comments: ambulates with SPC for community distances, does not use AD in the home ADLs Comments: indep with BADLs and light housework, has assist with cleaning & yardwork    OT Problem List: Decreased activity tolerance;Impaired balance (sitting and/or standing);Obesity   OT Treatment/Interventions:        OT Goals(Current goals can be found in the care plan section)   Acute Rehab OT Goals Patient Stated Goal: return home   OT Frequency:       Co-evaluation              AM-PAC OT 6 Clicks Daily Activity     Outcome Measure Help from another person eating meals?: None Help from another person taking care of personal grooming?: None Help from another person toileting, which includes using toliet, bedpan, or urinal?:  None Help from another person bathing (including washing, rinsing, drying)?: A Little Help from another person to put on and taking off regular upper body clothing?: None Help from another person to put on and taking off regular lower body clothing?: A Lot 6 Click Score: 21   End of Session Nurse Communication: Mobility status  Activity Tolerance: Patient tolerated treatment well Patient left: in chair;with call bell/phone within reach;with chair alarm set  OT Visit Diagnosis: Unsteadiness on feet (R26.81)                Time: 9163-9084 OT Time Calculation (min): 39 min Charges:  OT General Charges $OT Visit: 1 Visit OT Evaluation $OT Eval Low Complexity: 1 Low OT Treatments $Self Care/Home Management : 8-22 mins  Sutton Plake M. [place], OTR/L [facility] Acute Rehabilitation Services [phone] Secure Chat Preferred  Justise Ehmann 12/07/2023, 11:04 AM

## 2023-12-07 NOTE — Discharge Summary (Signed)
 Physician Discharge Summary  Christopher [name] FMW:[id number] DOB: 1928-05-08 DOA: 12/05/2023  PCP: [name] LABOR, MD  Admit date: 12/05/2023 Discharge date: 12/07/2023  Admitted From: Home  Discharge disposition: Home   Recommendations for Outpatient Follow-Up:   Follow up with your primary care provider in one week.  Check CBC, BMP, magnesium  in the next visit Follow-up with ophthalmology as soon as possible to assess for possible CRAO.  Patient has been started on aspirin  while in the hospital patient will need to be continued.   Discharge Diagnosis:   Principal Problem:   Vision changes  Discharge Condition: Improved.  Diet recommendation: Low sodium, heart healthy.    Wound care: None.  Code status: Full.   History of Present Illness:   Christopher [name] is a 89 y.o. male with past medical history significant of NSVT, CHF EF 50%, HTN, CKD 3, AA.  The hospital with visual changes in his right eye, started noticing rain like droplets in his right eye followed by complete vision loss which subsequently resolved.  No previous history of CVA. In the ER CT head and CTA head and neck did not show any acute pathology but did show moderate right-sided vertebral artery stenosis.  Neurology was consulted and patient was admitted hospital for further evaluation and treatment.   Hospital Course:   Following conditions were addressed during hospitalization as listed below,  Right-sided vision change CT head was negative for stroke.  CTA head and neck suggestive of moderate right-sided vertebral artery stenosis.  Hemoglobin A1c of 5.4., lipid panel showed LDL of 93.SABRA  Neurology was consulted.  Was started on aspirin  and Plavix  but at this time neurology recommends aspirin  to be continued on discharge.  MRI of the brain shows no acute intracranial abnormality.  Review of recent 2D echocardiogram chocardiogram September 2025 showed EF of 50%, grade 2 DD.  TSH of 2.7.  UDS was  negative.  Neurology has an impression of possible CR O and has requested the patient be seen by ophthalmology as soon as possible as outpatient.  Patient is aware of this.   Congestive heart failure with preserved EF Chronic bilateral lower extremity edema Moderate to severe aortic stenosis Review of recent 2D echo from September 2025 shows EF 50% with grade 2 DD.  Continue metoprolo, Maxide on discharge. follow-up with cardiology as outpatient   History of NSVT Seen by electrophysiology service as outpatient.  Continue metoprolol .   CKD stage III  Creatinine at baseline of 1.8.  Will continue to monitor.   History of prostate cancer S/p radiation.  Currently stable    Disposition.  At this time, patient is stable for disposition home with outpatient PCP and ophthalmology follow-up  Medical Consultants:   Neurology  Procedures:    None Subjective:   Today, patient was seen and examined at bedside.  Feels a little visual disturbance with floaters in the left eye but much better than when he came in.  Seen by neurology as well and okay for discharge.  Discharge Exam:   Vitals:   12/07/23 0300 12/07/23 0818  BP: 122/68 117/64  Pulse: 65 (!) 59  Resp: 18 18  Temp: (!) 97.4 F (36.3 C) (!) 97.5 F (36.4 C)  SpO2: 98% 98%   Vitals:   12/06/23 2012 12/06/23 2355 12/07/23 0300 12/07/23 0818  BP: 122/65 117/70 122/68 117/64  Pulse: (!) 58 (!) 58 65 (!) 59  Resp: 18 16 18 18   Temp: 98.2 F (36.8 C) (!) 97.4 F (  36.3 C) (!) 97.4 F (36.3 C) (!) 97.5 F (36.4 C)  TempSrc: Oral Oral Oral Oral  SpO2: 95% 96% 98% 98%    General: Alert awake, not in obvious distress, elderly male HENT: pupils equally reacting to light,  No scleral pallor or icterus noted. Oral mucosa is moist.  Chest:  Clear breath sounds.  no crackles or wheezes.  CVS: S1 &S2 heard. No murmur.  Regular rate and rhythm. Abdomen: Soft, nontender, nondistended.  Bowel sounds are heard.   Extremities: No  cyanosis, clubbing or edema.  Peripheral pulses are palpable. Psych: Alert, awake and oriented, normal mood CNS:  No cranial nerve deficits.  Moves all extremities Skin: Warm and dry.  No rashes noted.  The results of significant diagnostics from this hospitalization (including imaging, microbiology, ancillary and laboratory) are listed below for reference.     Diagnostic Studies:   MR BRAIN WO CONTRAST Result Date: 12/06/2023 EXAM: MRI BRAIN WITHOUT CONTRAST 12/06/2023 10:46:50 AM TECHNIQUE: Multiplanar multisequence MRI of the head/brain was performed without the administration of intravenous contrast. COMPARISON: CT of the head dated 12/05/2023 and MRI of the head dated 07/08/2016. CLINICAL HISTORY: Transient ischemic attack (TIA). FINDINGS: BRAIN AND VENTRICLES: No acute infarct. No intracranial hemorrhage. No mass. No midline shift. No hydrocephalus. Age-related atrophy. Mild periventricular and subcortical white matter disease. The sella is unremarkable. Normal flow voids. ORBITS: Patient is status post bilateral lens replacement. SINUSES AND MASTOIDS: Moderate opacification of the left maxillary sinus. BONES AND SOFT TISSUES: Normal marrow signal. No acute soft tissue abnormality. IMPRESSION: 1. No acute intracranial abnormality. 2. Age-related atrophy and mild periventricular and subcortical white matter disease. 3. Moderate opacification of the left maxillary sinus. Electronically signed by: Evalene Coho MD 12/06/2023 11:01 AM EST RP Workstation: [id number]   CT Angio Head Neck W WO CM Result Date: 12/06/2023 EXAM: CTA HEAD AND NECK WITH AND WITHOUT 12/06/2023 03:44:21 AM TECHNIQUE: CTA of the head and neck was performed with and without the administration of intravenous contrast. Multiplanar 2D and/or 3D reformatted images are provided for review. Automated exposure control, iterative reconstruction, and/or weight based adjustment of the mA/kV was utilized to reduce the radiation dose  to as low as reasonably achievable. COMPARISON: None available. CLINICAL HISTORY: Neuro deficit, acute, stroke suspected FINDINGS: AORTIC ARCH AND ARCH VESSELS: No dissection or arterial injury. No significant stenosis of the brachiocephalic or subclavian arteries. CERVICAL CAROTID ARTERIES: No dissection, arterial injury, or hemodynamically significant stenosis by NASCET criteria. CERVICAL VERTEBRAL ARTERIES: Moderate right vertebral artery origin stenosis. Otherwise, no significant stenosis. LUNGS AND MEDIASTINUM: Approximately 5 mm left upper lobe pulmonary nodule.  No consolidation. SOFT TISSUES: No acute abnormality. BONES: No acute abnormality. CTA HEAD: ANTERIOR CIRCULATION: No significant stenosis of the internal carotid arteries. No significant stenosis of the anterior cerebral arteries. No significant stenosis of the middle cerebral arteries. No aneurysm. POSTERIOR CIRCULATION: No significant stenosis of the posterior cerebral arteries. No significant stenosis of the basilar artery. No significant stenosis of the vertebral arteries. Left vertebral artery is nondominant and terminates as PICA, anatomic variant. No aneurysm. OTHER: No dural venous sinus thrombosis on this non-dedicated study. IMPRESSION: 1. No large vessel occlusion. 2. Moderate right vertebral artery origin stenosis. 3. Approximately 5 mm left upper lobe pulmonary nodule. Recommend follow up CT chest in 6-12 months if the patient is high-risk. Electronically signed by: Gilmore Molt MD 12/06/2023 03:56 AM EST RP Workstation: [id number]   CT HEAD WO CONTRAST Result Date: 12/05/2023 EXAM: CT HEAD WITHOUT CONTRAST 12/05/2023 11:00:38  PM TECHNIQUE: CT of the head was performed without the administration of intravenous contrast. Automated exposure control, iterative reconstruction, and/or weight based adjustment of the mA/kV was utilized to reduce the radiation dose to as low as reasonably achievable. COMPARISON: 07/08/2016 MRI. CLINICAL  HISTORY: Partial right eye blindness. FINDINGS: BRAIN AND VENTRICLES: No acute hemorrhage. No acute infarction. Chronic atrophic changes are noted. Chronic white matter ischemic changes are seen. ORBITS: No acute abnormality noted. SINUSES: No acute abnormality. SOFT TISSUES AND SKULL: No acute soft tissue abnormality. No skull fracture. IMPRESSION: 1. Chronic atrophic changes and chronic white matter ischemic changes without acute abnormality. . Electronically signed by: Oneil Devonshire MD 12/05/2023 11:04 PM EST RP Workstation: [id number]     Labs:   Basic Metabolic Panel: Recent Labs  Lab 12/05/23 2136 12/06/23 1000 12/07/23 0114  NA 140  --  140  K 3.7  --  3.5  CL 102  --  103  CO2 27  --  27  GLUCOSE 91  --  92  BUN 38*  --  29*  CREATININE 1.86* 1.60* 1.88*  CALCIUM  10.0  --  9.1   GFR Estimated Creatinine Clearance: 29.8 mL/min (A) (by C-G formula based on SCr of 1.88 mg/dL (H)). Liver Function Tests: Recent Labs  Lab 12/05/23 2136 12/07/23 0114  AST 25 15  ALT 11 11  ALKPHOS 69 47  BILITOT 0.5 0.7  PROT 6.9 5.8*  ALBUMIN  3.8 2.8*   No results for input(s): LIPASE, AMYLASE in the last 168 hours. No results for input(s): AMMONIA in the last 168 hours. Coagulation profile Recent Labs  Lab 12/05/23 2136  INR 1.0    CBC: Recent Labs  Lab 12/05/23 2136 12/06/23 1000 12/07/23 0114  WBC 9.5 7.0 8.3  NEUTROABS 5.2  --   --   HGB 14.1 14.0 13.0  HCT 43.1 43.2 38.7*  MCV 92.7 93.5 91.7  PLT 285 260 245   Cardiac Enzymes: No results for input(s): CKTOTAL, CKMB, CKMBINDEX, TROPONINI in the last 168 hours. BNP: Invalid input(s): POCBNP CBG: Recent Labs  Lab 12/05/23 2144  GLUCAP 92   D-Dimer No results for input(s): DDIMER in the last 72 hours. Hgb A1c Recent Labs    12/06/23 1732  HGBA1C 5.4   Lipid Profile Recent Labs    12/06/23 1732  CHOL 167  HDL 57  LDLCALC 93  TRIG 87  CHOLHDL 2.9   Thyroid  function studies Recent  Labs    12/06/23 1732  TSH 2.720   Anemia work up No results for input(s): VITAMINB12, FOLATE, FERRITIN, TIBC, IRON, RETICCTPCT in the last 72 hours. Microbiology No results found for this or any previous visit (from the past 240 hours).   Discharge Instructions:   Discharge Instructions     Diet - low sodium heart healthy   Complete by: As directed    Discharge instructions   Complete by: As directed    Follow-up with ophthalmology as outpatient as soon as possible.  Start taking aspirin  at home.  Seek medical attention for worsening symptoms or new symptoms.  Follow-up with your primary care provider in 1 to 2 weeks for routine checkup/blood work.   Increase activity slowly   Complete by: As directed       Allergies as of 12/07/2023       Reactions   Chicken Allergy Anaphylaxis, Swelling   Other reaction(s): Unknown   Macrolides And Ketolides    Other Reaction(s): Other (See Comments) Kidney pain   Cephalexin Swelling  Swelling of lips, ulceration of the tongue. Tolerated Rocephin  10/06/23.   Mirabegron    Other reaction(s): side pain   Penicillins Itching, Swelling        Medication List     STOP taking these medications    predniSONE  5 MG tablet Commonly known as: DELTASONE        TAKE these medications    allopurinol  100 MG tablet Commonly known as: ZYLOPRIM  Take 100 mg by mouth daily.   aspirin  EC 81 MG tablet Take 1 tablet (81 mg total) by mouth daily. Swallow whole. Start taking on: December 08, 2023   Magnesium  400 MG Tabs Take 400 mg by mouth daily. Magnesium  Taurate OTC   metoprolol  succinate 25 MG 24 hr tablet Commonly known as: Toprol  XL Take 1 tablet (25 mg total) by mouth daily.   triamterene -hydrochlorothiazide  75-50 MG tablet Commonly known as: MAXZIDE  Take 1 tablet by mouth daily.        Follow-up Information     [name] LABOR, MD Follow up in 1 week(s).   Specialty: Internal Medicine Contact  information: [address] [place] [address] [phone]                  Time coordinating discharge: 39 minutes  Signed:  Charmaine Placido  [facility] 12/07/2023, 11:05 AM

## 2023-12-07 NOTE — Progress Notes (Signed)
 OT Cancellation Note  Patient Details Name: Christopher [name] MRN: [id number] DOB: [birth year]   Cancelled Treatment:    Reason Eval/Treat Not Completed: OT screened, no needs identified, will sign off (OT imminent order recieved, pt evaluated 11/26 AM with no acute OT needs identified and a recommendation for OP OT at d/c.)  Latorsha Curling K, OTD, OTR/L SecureChat [facility] [phone]   Laneta POUR Koonce 12/07/2023, 12:15 PM

## 2023-12-13 ENCOUNTER — Telehealth (HOSPITAL_COMMUNITY): Payer: Self-pay | Admitting: Emergency Medicine

## 2023-12-13 ENCOUNTER — Other Ambulatory Visit
Admission: RE | Admit: 2023-12-13 | Discharge: 2023-12-13 | Disposition: A | Source: Ambulatory Visit | Attending: Ophthalmology | Admitting: Ophthalmology

## 2023-12-13 DIAGNOSIS — Z961 Presence of intraocular lens: Secondary | ICD-10-CM | POA: Diagnosis not present

## 2023-12-13 DIAGNOSIS — H3411 Central retinal artery occlusion, right eye: Secondary | ICD-10-CM | POA: Diagnosis not present

## 2023-12-13 LAB — C-REACTIVE PROTEIN: CRP: 3.1 mg/dL — ABNORMAL HIGH (ref <1.0)

## 2023-12-13 LAB — SEDIMENTATION RATE: Sed Rate: 45 mm/h — ABNORMAL HIGH (ref 0–20)

## 2023-12-13 NOTE — Telephone Encounter (Signed)
Attempted to call patient regarding upcoming cardiac PET appointment. Left message on voicemail with name and callback number Aidan Moten RN Navigator Cardiac Imaging [facility] [phone] Office [phone] Cell  

## 2023-12-14 ENCOUNTER — Ambulatory Visit (HOSPITAL_COMMUNITY)
Admission: RE | Admit: 2023-12-14 | Discharge: 2023-12-14 | Disposition: A | Source: Ambulatory Visit | Attending: Emergency Medicine

## 2023-12-14 DIAGNOSIS — I5022 Chronic systolic (congestive) heart failure: Secondary | ICD-10-CM | POA: Diagnosis not present

## 2023-12-14 DIAGNOSIS — I493 Ventricular premature depolarization: Secondary | ICD-10-CM | POA: Diagnosis not present

## 2023-12-14 DIAGNOSIS — I472 Ventricular tachycardia, unspecified: Secondary | ICD-10-CM | POA: Diagnosis not present

## 2023-12-14 DIAGNOSIS — R918 Other nonspecific abnormal finding of lung field: Secondary | ICD-10-CM | POA: Diagnosis not present

## 2023-12-14 DIAGNOSIS — I2510 Atherosclerotic heart disease of native coronary artery without angina pectoris: Secondary | ICD-10-CM | POA: Insufficient documentation

## 2023-12-14 DIAGNOSIS — R079 Chest pain, unspecified: Secondary | ICD-10-CM | POA: Diagnosis not present

## 2023-12-14 DIAGNOSIS — I700 Atherosclerosis of aorta: Secondary | ICD-10-CM | POA: Diagnosis not present

## 2023-12-14 LAB — NM PET CT CARDIAC PERFUSION MULTI W/ABSOLUTE BLOODFLOW
LV dias vol: 141 mL (ref 62–150)
MBFR: 2.16
Nuc Rest EF: 33 %
Nuc Stress EF: 37 %
Peak HR: 82 {beats}/min
Rest HR: 76 {beats}/min
Rest MBF: 0.63 ml/g/min
Rest Nuclear Isotope Dose: 27.8 mCi
ST Depression (mm): 0 mm
Stress MBF: 1.36 ml/g/min
Stress Nuclear Isotope Dose: 28.4 mCi
TID: 1.11

## 2023-12-14 MED ORDER — REGADENOSON 0.4 MG/5ML IV SOLN
INTRAVENOUS | Status: AC
  Filled 2023-12-14: qty 5

## 2023-12-14 MED ORDER — RUBIDIUM RB82 GENERATOR (RUBYFILL)
28.4000 | PACK | Freq: Once | INTRAVENOUS | Status: AC
  Administered 2023-12-14: 28.4 via INTRAVENOUS

## 2023-12-14 MED ORDER — RUBIDIUM RB82 GENERATOR (RUBYFILL)
27.8000 | PACK | Freq: Once | INTRAVENOUS | Status: AC
  Administered 2023-12-14: 27.8 via INTRAVENOUS

## 2023-12-14 MED ORDER — REGADENOSON 0.4 MG/5ML IV SOLN
0.4000 mg | Freq: Once | INTRAVENOUS | Status: AC
  Administered 2023-12-14: 0.4 mg via INTRAVENOUS

## 2023-12-19 DIAGNOSIS — H3411 Central retinal artery occlusion, right eye: Secondary | ICD-10-CM | POA: Diagnosis not present

## 2023-12-20 DIAGNOSIS — R700 Elevated erythrocyte sedimentation rate: Secondary | ICD-10-CM | POA: Diagnosis not present

## 2023-12-20 DIAGNOSIS — I493 Ventricular premature depolarization: Secondary | ICD-10-CM | POA: Diagnosis not present

## 2023-12-20 DIAGNOSIS — M109 Gout, unspecified: Secondary | ICD-10-CM | POA: Diagnosis not present

## 2023-12-21 ENCOUNTER — Ambulatory Visit: Attending: Vascular Surgery | Admitting: Vascular Surgery

## 2023-12-21 ENCOUNTER — Encounter: Payer: Self-pay | Admitting: Vascular Surgery

## 2023-12-21 VITALS — BP 126/80 | HR 70 | Temp 97.9°F

## 2023-12-21 DIAGNOSIS — M316 Other giant cell arteritis: Secondary | ICD-10-CM | POA: Diagnosis present

## 2023-12-21 NOTE — H&P (View-Only) (Signed)
 Patient ID: Christopher [name], male   DOB: 10/01/1928, 89 y.o.   MRN: [id number]  Reason for Consult: New Patient (Initial Visit)   Referred by [name] LABOR, *  Subjective:   HPI:  Christopher [name] is a 89 y.o. male without significant history of vascular disease.  He does have a sister who is present today with previous temporal arteritis.  He recently has changes in his eye with protrusion of the right eye but without changes in vision.  No headaches no temporal pain.  Denies fevers or chills or other constitutional symptoms.  Past Medical History:  Diagnosis Date   Bruises easily    both hands   Cancer (HCC) 2005   prostate cancer treated with external beam radiation and radioactive seeds   Cataracts, bilateral    immature   Chronic kidney disease    Complication of anesthesia    states he was given too much anesthesia during his knee replacement   Constipation    takes Colace daily as needed   Decreased hearing    Diverticulitis 2003   DJD (degenerative joint disease)    thumbs and knees   History of colon polyps    benign   History of gout    Joint pain    Joint swelling    Leg cramps    no meds   Peripheral edema    takes Maxzide  daily   Pneumonia    hx of-15+yrs ago   Ringing in ears    UTI (lower urinary tract infection)    just completed antibiotic on 11/14/14   Family History  Problem Relation Age of Onset   Kidney disease Mother    Past Surgical History:  Procedure Laterality Date   APPENDECTOMY  1941   CATARACT EXTRACTION W/PHACO Right 10/05/2017   Procedure: CATARACT EXTRACTION PHACO AND INTRAOCULAR LENS PLACEMENT (IOC) RIGHT;  Surgeon: Mittie Gaskin, MD;  Location: [facility];  Service: Ophthalmology;  Laterality: Right;  requests arrival time to be after 10am   CATARACT EXTRACTION W/PHACO Left 10/26/2017   Procedure: CATARACT EXTRACTION PHACO AND INTRAOCULAR LENS PLACEMENT (IOC) LEFT;  Surgeon: Mittie Gaskin, MD;  Location:  [facility];  Service: Ophthalmology;  Laterality: Left;   COLONOSCOPY     ENDOVENOUS ABLATION SAPHENOUS VEIN W/ LASER  12-01-2011   left greater saphenous vein by Krystal Doing MD    ENDOVENOUS ABLATION SAPHENOUS VEIN W/ LASER  12-22-2011   right greater saphenous vein    by Krystal Doing MD   HAND SURGERY     HEMORRHOID SURGERY  1970   PARTIAL COLECTOMY     prostate seeds      removal of chest wall tumor     sigmoid colon resection     TOTAL HIP ARTHROPLASTY Right 11/26/2014   Procedure: TOTAL HIP ARTHROPLASTY;  Surgeon: Maude LELON Right, MD;  Location: [facility] OR;  Service: Orthopedics;  Laterality: Right;   TOTAL KNEE ARTHROPLASTY  08-2010    Short Social History:  Social History   Tobacco Use   Smoking status: Former    Current packs/day: 0.00    Average packs/day: 1 pack/day for 12.0 years (12.0 ttl pk-yrs)    Types: Cigarettes    Start date: 07/21/1944    Quit date: 07/21/1956    Years since quitting: 67.4   Smokeless tobacco: Never   Tobacco comments:    quick smoking 49yrs ago  Substance Use Topics   Alcohol use: No    Allergies  Allergen Reactions  Chicken Allergy Anaphylaxis and Swelling    Other reaction(s): Unknown   Macrolides And Ketolides     Other Reaction(s): Other (See Comments)  Kidney pain   Cephalexin Swelling    Swelling of lips, ulceration of the tongue. Tolerated Rocephin  10/06/23.   Mirabegron     Other reaction(s): side pain   Penicillins Itching and Swelling    Current Outpatient Medications  Medication Sig Dispense Refill   allopurinol  (ZYLOPRIM ) 100 MG tablet Take 100 mg by mouth daily.     aspirin  EC 81 MG tablet Take 1 tablet (81 mg total) by mouth daily. Swallow whole.     Magnesium  400 MG TABS Take 400 mg by mouth daily. Magnesium  Taurate OTC     metoprolol  succinate (TOPROL  XL) 25 MG 24 hr tablet Take 1 tablet (25 mg total) by mouth daily. 90 tablet 3   triamterene -hydrochlorothiazide  (MAXZIDE ) 75-50 MG tablet Take 1 tablet by  mouth daily.     No current facility-administered medications for this visit.    Review of Systems  Constitutional:  Constitutional negative. HENT: HENT negative.  Eyes: Eyes negative.       Change in right eye Respiratory: Respiratory negative.  Cardiovascular: Cardiovascular negative.  GI: Gastrointestinal negative.  Musculoskeletal: Musculoskeletal negative.  Skin: Skin negative.  Neurological: Neurological negative. Hematologic: Hematologic/lymphatic negative.  Psychiatric: Psychiatric negative.        Objective:  Objective   Vitals:   12/21/23 1455  BP: 126/80  Pulse: 70  Temp: 97.9 F (36.6 C)  SpO2: 92%   There is no height or weight on file to calculate BMI.  Physical Exam HENT:     Head: Normocephalic.     Nose: Nose normal.  Eyes:     Pupils: Pupils are equal, round, and reactive to light.  Cardiovascular:     Rate and Rhythm: Normal rate.     Comments: Palpable right temporal pulses Pulmonary:     Effort: Pulmonary effort is normal.  Musculoskeletal:        General: Normal range of motion.     Right lower leg: Edema present.     Left lower leg: Edema present.  Skin:    General: Skin is warm.     Capillary Refill: Capillary refill takes less than 2 seconds.  Neurological:     General: No focal deficit present.     Mental Status: He is alert.     Data: CRP 3.1 ESR 45  AORTIC ARCH AND ARCH VESSELS: No dissection or arterial injury. No significant stenosis of the brachiocephalic or subclavian arteries.   CERVICAL CAROTID ARTERIES: No dissection, arterial injury, or hemodynamically significant stenosis by NASCET criteria.   CERVICAL VERTEBRAL ARTERIES: Moderate right vertebral artery origin stenosis. Otherwise, no significant stenosis.   LUNGS AND MEDIASTINUM: Approximately 5 mm left upper lobe pulmonary nodule.  No consolidation.   SOFT TISSUES: No acute abnormality.   BONES: No acute abnormality.   CTA HEAD:   ANTERIOR  CIRCULATION: No significant stenosis of the internal carotid arteries. No significant stenosis of the anterior cerebral arteries. No significant stenosis of the middle cerebral arteries. No aneurysm.   POSTERIOR CIRCULATION: No significant stenosis of the posterior cerebral arteries. No significant stenosis of the basilar artery. No significant stenosis of the vertebral arteries. Left vertebral artery is nondominant and terminates as PICA, anatomic variant. No aneurysm.   OTHER: No dural venous sinus thrombosis on this non-dedicated study.   CT IMPRESSION: 1. No large vessel occlusion. 2. Moderate right vertebral  artery origin stenosis. 3. Approximately 5 mm left upper lobe pulmonary nodule. Recommend follow up CT chest in 6-12 months if the patient is high-risk.     Assessment/Plan:     89 year old male with history as above with concern for temporal arteritis.  I have discussed with his primary care doctor Dr. Dorn Sauers and will plan for right temporal artery biopsy given elevated inflammatory markers with family history.  I have discussed this with the patient and his sister and we will schedule this in the near future.  Given that his symptoms have not progressed we will hold on steroids until diagnosis is confirmed.    Victoire Deans C. Sheree, MD [facility] Office: [phone] Pager: [phone]

## 2023-12-21 NOTE — Progress Notes (Signed)
 Patient ID: Christopher [name], male   DOB: 10/01/1928, 89 y.o.   MRN: [id number]  Reason for Consult: New Patient (Initial Visit)   Referred by [name] LABOR, *  Subjective:   HPI:  Christopher [name] is a 89 y.o. male without significant history of vascular disease.  He does have a sister who is present today with previous temporal arteritis.  He recently has changes in his eye with protrusion of the right eye but without changes in vision.  No headaches no temporal pain.  Denies fevers or chills or other constitutional symptoms.  Past Medical History:  Diagnosis Date   Bruises easily    both hands   Cancer (HCC) 2005   prostate cancer treated with external beam radiation and radioactive seeds   Cataracts, bilateral    immature   Chronic kidney disease    Complication of anesthesia    states he was given too much anesthesia during his knee replacement   Constipation    takes Colace daily as needed   Decreased hearing    Diverticulitis 2003   DJD (degenerative joint disease)    thumbs and knees   History of colon polyps    benign   History of gout    Joint pain    Joint swelling    Leg cramps    no meds   Peripheral edema    takes Maxzide  daily   Pneumonia    hx of-15+yrs ago   Ringing in ears    UTI (lower urinary tract infection)    just completed antibiotic on 11/14/14   Family History  Problem Relation Age of Onset   Kidney disease Mother    Past Surgical History:  Procedure Laterality Date   APPENDECTOMY  1941   CATARACT EXTRACTION W/PHACO Right 10/05/2017   Procedure: CATARACT EXTRACTION PHACO AND INTRAOCULAR LENS PLACEMENT (IOC) RIGHT;  Surgeon: Mittie Gaskin, MD;  Location: [facility];  Service: Ophthalmology;  Laterality: Right;  requests arrival time to be after 10am   CATARACT EXTRACTION W/PHACO Left 10/26/2017   Procedure: CATARACT EXTRACTION PHACO AND INTRAOCULAR LENS PLACEMENT (IOC) LEFT;  Surgeon: Mittie Gaskin, MD;  Location:  [facility];  Service: Ophthalmology;  Laterality: Left;   COLONOSCOPY     ENDOVENOUS ABLATION SAPHENOUS VEIN W/ LASER  12-01-2011   left greater saphenous vein by Krystal Doing MD    ENDOVENOUS ABLATION SAPHENOUS VEIN W/ LASER  12-22-2011   right greater saphenous vein    by Krystal Doing MD   HAND SURGERY     HEMORRHOID SURGERY  1970   PARTIAL COLECTOMY     prostate seeds      removal of chest wall tumor     sigmoid colon resection     TOTAL HIP ARTHROPLASTY Right 11/26/2014   Procedure: TOTAL HIP ARTHROPLASTY;  Surgeon: Maude LELON Right, MD;  Location: [facility] OR;  Service: Orthopedics;  Laterality: Right;   TOTAL KNEE ARTHROPLASTY  08-2010    Short Social History:  Social History   Tobacco Use   Smoking status: Former    Current packs/day: 0.00    Average packs/day: 1 pack/day for 12.0 years (12.0 ttl pk-yrs)    Types: Cigarettes    Start date: 07/21/1944    Quit date: 07/21/1956    Years since quitting: 67.4   Smokeless tobacco: Never   Tobacco comments:    quick smoking 49yrs ago  Substance Use Topics   Alcohol use: No    Allergies  Allergen Reactions  Chicken Allergy Anaphylaxis and Swelling    Other reaction(s): Unknown   Macrolides And Ketolides     Other Reaction(s): Other (See Comments)  Kidney pain   Cephalexin Swelling    Swelling of lips, ulceration of the tongue. Tolerated Rocephin  10/06/23.   Mirabegron     Other reaction(s): side pain   Penicillins Itching and Swelling    Current Outpatient Medications  Medication Sig Dispense Refill   allopurinol  (ZYLOPRIM ) 100 MG tablet Take 100 mg by mouth daily.     aspirin  EC 81 MG tablet Take 1 tablet (81 mg total) by mouth daily. Swallow whole.     Magnesium  400 MG TABS Take 400 mg by mouth daily. Magnesium  Taurate OTC     metoprolol  succinate (TOPROL  XL) 25 MG 24 hr tablet Take 1 tablet (25 mg total) by mouth daily. 90 tablet 3   triamterene -hydrochlorothiazide  (MAXZIDE ) 75-50 MG tablet Take 1 tablet by  mouth daily.     No current facility-administered medications for this visit.    Review of Systems  Constitutional:  Constitutional negative. HENT: HENT negative.  Eyes: Eyes negative.       Change in right eye Respiratory: Respiratory negative.  Cardiovascular: Cardiovascular negative.  GI: Gastrointestinal negative.  Musculoskeletal: Musculoskeletal negative.  Skin: Skin negative.  Neurological: Neurological negative. Hematologic: Hematologic/lymphatic negative.  Psychiatric: Psychiatric negative.        Objective:  Objective   Vitals:   12/21/23 1455  BP: 126/80  Pulse: 70  Temp: 97.9 F (36.6 C)  SpO2: 92%   There is no height or weight on file to calculate BMI.  Physical Exam HENT:     Head: Normocephalic.     Nose: Nose normal.  Eyes:     Pupils: Pupils are equal, round, and reactive to light.  Cardiovascular:     Rate and Rhythm: Normal rate.     Comments: Palpable right temporal pulses Pulmonary:     Effort: Pulmonary effort is normal.  Musculoskeletal:        General: Normal range of motion.     Right lower leg: Edema present.     Left lower leg: Edema present.  Skin:    General: Skin is warm.     Capillary Refill: Capillary refill takes less than 2 seconds.  Neurological:     General: No focal deficit present.     Mental Status: He is alert.     Data: CRP 3.1 ESR 45  AORTIC ARCH AND ARCH VESSELS: No dissection or arterial injury. No significant stenosis of the brachiocephalic or subclavian arteries.   CERVICAL CAROTID ARTERIES: No dissection, arterial injury, or hemodynamically significant stenosis by NASCET criteria.   CERVICAL VERTEBRAL ARTERIES: Moderate right vertebral artery origin stenosis. Otherwise, no significant stenosis.   LUNGS AND MEDIASTINUM: Approximately 5 mm left upper lobe pulmonary nodule.  No consolidation.   SOFT TISSUES: No acute abnormality.   BONES: No acute abnormality.   CTA HEAD:   ANTERIOR  CIRCULATION: No significant stenosis of the internal carotid arteries. No significant stenosis of the anterior cerebral arteries. No significant stenosis of the middle cerebral arteries. No aneurysm.   POSTERIOR CIRCULATION: No significant stenosis of the posterior cerebral arteries. No significant stenosis of the basilar artery. No significant stenosis of the vertebral arteries. Left vertebral artery is nondominant and terminates as PICA, anatomic variant. No aneurysm.   OTHER: No dural venous sinus thrombosis on this non-dedicated study.   CT IMPRESSION: 1. No large vessel occlusion. 2. Moderate right vertebral  artery origin stenosis. 3. Approximately 5 mm left upper lobe pulmonary nodule. Recommend follow up CT chest in 6-12 months if the patient is high-risk.     Assessment/Plan:     89 year old male with history as above with concern for temporal arteritis.  I have discussed with his primary care doctor Dr. Dorn Sauers and will plan for right temporal artery biopsy given elevated inflammatory markers with family history.  I have discussed this with the patient and his sister and we will schedule this in the near future.  Given that his symptoms have not progressed we will hold on steroids until diagnosis is confirmed.    Victoire Deans C. Sheree, MD [facility] Office: [phone] Pager: [phone]

## 2023-12-22 ENCOUNTER — Other Ambulatory Visit: Payer: Self-pay

## 2023-12-22 DIAGNOSIS — M316 Other giant cell arteritis: Secondary | ICD-10-CM

## 2023-12-26 ENCOUNTER — Encounter (HOSPITAL_COMMUNITY): Payer: Self-pay | Admitting: Vascular Surgery

## 2023-12-26 ENCOUNTER — Other Ambulatory Visit: Payer: Self-pay

## 2023-12-26 NOTE — Progress Notes (Signed)
 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  Recently admitted 11/24 to 12/07/2023 after presenting with right sided visual changes. CT head was negative for stroke.  CTA head and neck suggestive of moderate right-sided vertebral artery stenosis. MRI of the brain with no acute intracranial abnormality.  Recent echo September 2025 showed EF 45-50%, grade 2 DD.  Neurology felt possible BRAO and recommended continue 81 mg ASA and have outpatient ophthalmology evaluation after discharge.  Patient was seen in follow-up by ophthalmologist Dr. Dingeldein and subsequently referred to vascular surgery for evaluation of possible temporal arteritis.   Given history of frequent PVCs, cardiac PET CT perfusion scan was ordered by cardiology to evaluate for ischemia.  Scan done 12/14/2023 showed normal perfusion, no evidence of ischemia or infarct.  BMP and CBC 12/07/2023 reviewed, creatinine 1.8 consistent with history of CKD 3, hypoalbuminemia with albumin  2.8, otherwise unremarkable.  Patient will need to have surgery evaluation.  EKG 12/05/2023: Sinus rhythm.  Rate 70.  PVC.  Low voltage, precordial leads.  Nonspecific T abnormalities, inferior leads.  Cardiac PET perfusion scan 12/14/2023:   LV perfusion is normal. There is no evidence of ischemia. There is no evidence of infarction.   Rest left ventricular function is abnormal. Rest global function is moderately reduced. There were no regional wall motion abnormalities. Rest EF: 33%. Stress left ventricular function is abnormal. Stress global function is moderately reduced. There were no regional wall abnormalities. Stress EF: 37%. End diastolic cavity size is mildly enlarged.   Myocardial blood flow was computed to be 0.108ml/g/min at  rest and 1.36ml/g/min at stress. Global myocardial blood flow reserve was 2.16 and was normal.   Coronary calcium  was present on the attenuation correction CT images. Severe coronary calcifications were present. Coronary calcifications were present in the left anterior descending artery, left circumflex artery and right coronary artery distribution(s).   The study is normal. The study is intermediate risk due to moderately reduced LVEF. No infarction or ischemia.   Electronically signed by Darryle Decent, MD  TTE 10/04/2023:  1. Left ventricular ejection fraction, by estimation, is 45 to 50%. The  left ventricle has mildly decreased function. The left ventricle  demonstrates global hypokinesis. There is moderate concentric left  ventricular hypertrophy. Left ventricular  diastolic parameters are consistent with Grade II diastolic dysfunction  (pseudonormalization).   2. Right ventricular systolic function is normal. The right ventricular  size is normal. There is moderately elevated pulmonary artery systolic  pressure.   3. Left atrial size was mildly dilated.   4. The mitral valve is grossly normal. Mild mitral valve regurgitation.  No evidence of mitral stenosis.   5. Decreased stroke volume index. DVI 0.35 and TFR 184. Valve visually  appears calcified with decrease motion and aortic stenosis may be  underestimated. Largest gradient seen in the apical window. The aortic  valve is calcified. Aortic valve  regurgitation is trivial. Mild to moderate aortic valve stenosis. Aortic  valve area, by VTI measures 1.09 cm. Aortic valve mean gradient measures  17.0 mmHg. Aortic valve Vmax measures 2.62 m/s. Aortic valve acceleration  time measures 84 msec.      Lynwood Geofm RIGGERS [facility] Short Stay Center/Anesthesiology Phone [phone] 12/26/2023 11:26 AM

## 2023-12-26 NOTE — Progress Notes (Signed)
 SDW call  Patient's sister, Narciso was given pre-op instructions over the phone. She verbalized understanding of instructions provided.  She denied patient having any SOB, fever or cough   PCP - Dr, Dorn Sauers Cardiologist - Dr. Wilbert Bihari,, LOV 11/17/2023 with NP EP Cardiologist: Dr. Eulas Furbish, LOV 11/25/2023 Pulmonary:    PPM/ICD - denies Device Orders - na Rep Notified - na   Chest x-ray - 10/03/2023 EKG -  12/06/2023 Stress Test - 12/14/2023 ECHO - 10/04/2023 Cardiac Cath -   Sleep Study/sleep apnea/CPAP: denies  Non-diabetic  Blood Thinner Instructions: denies Aspirin  Instructions: continue   ERAS Protcol - NPO   Anesthesia review: Yes. CHF, frequent PVD's NSVT  Your procedure is scheduled on Tuesday December 27, 2023  Report to [facility] Main Entrance A at  [phone]  A.M., then check in with the Admitting office.  Call this number if you have problems the morning of surgery:  [phone]   If you have any questions prior to your surgery date call [phone]: Open Monday-Friday 8am-4pm If you experience any cold or flu symptoms such as cough, fever, chills, shortness of breath, etc. between now and your scheduled surgery, please notify us  at the above number     Remember:  Do not eat or drink after midnight the night before your surgery  Take these medicines the morning of surgery with A SIP OF WATER:  Allopurinol , ASA, metoprolol , prednisone   As of today, STOP taking any Aleve, Naproxen, Ibuprofen, Motrin, Advil, Goody's, BC's, all herbal medications, fish oil, and all vitamins.

## 2023-12-26 NOTE — Anesthesia Preprocedure Evaluation (Addendum)
 Anesthesia Evaluation  Patient identified by MRN, date of birth, ID band Patient awake    Reviewed: Allergy & Precautions, NPO status , Patient's Chart, lab work & pertinent test results  History of Anesthesia Complications Negative for: history of anesthetic complications  Airway Mallampati: II  TM Distance: >3 FB Neck ROM: Full    Dental  (+) Dental Advisory Given Crowns:   Pulmonary neg shortness of breath, neg sleep apnea, neg COPD, neg recent URI, former smoker   Pulmonary exam normal breath sounds clear to auscultation       Cardiovascular hypertension (triamterene -HCTZ, metoprolol ), Pt. on home beta blockers and Pt. on medications pulmonary hypertension+CHF  + dysrhythmias (PVCs) Ventricular Tachycardia + Valvular Problems/Murmurs (mild MR, mild-to-moderate AS)  Rhythm:Regular Rate:Normal  Temporal arteritis  Normal stress test 12/14/2023  TTE 10/04/2023: IMPRESSIONS    1. Left ventricular ejection fraction, by estimation, is 45 to 50%. The  left ventricle has mildly decreased function. The left ventricle  demonstrates global hypokinesis. There is moderate concentric left  ventricular hypertrophy. Left ventricular  diastolic parameters are consistent with Grade II diastolic dysfunction  (pseudonormalization).   2. Right ventricular systolic function is normal. The right ventricular  size is normal. There is moderately elevated pulmonary artery systolic  pressure.   3. Left atrial size was mildly dilated.   4. The mitral valve is grossly normal. Mild mitral valve regurgitation.  No evidence of mitral stenosis.   5. Decreased stroke volume index. DVI 0.35 and TFR 184. Valve visually  appears calcified with decrease motion and aortic stenosis may be  underestimated. Largest gradient seen in the apical window. The aortic  valve is calcified. Aortic valve  regurgitation is trivial. Mild to moderate aortic valve stenosis.  Aortic  valve area, by VTI measures 1.09 cm. Aortic valve mean gradient measures  17.0 mmHg. Aortic valve Vmax measures 2.62 m/s. Aortic valve acceleration  time measures 84 msec.     Neuro/Psych negative neurological ROS     GI/Hepatic Neg liver ROS,neg GERD  ,,Diverticulosis    Endo/Other  negative endocrine ROS    Renal/GU CRFRenal disease   H/o prostate cancer    Musculoskeletal  (+) Arthritis , Osteoarthritis,    Abdominal  (+) + obese  Peds  Hematology negative hematology ROS (+) Lab Results      Component                Value               Date                      WBC                      8.3                 12/07/2023                HGB                      13.0                12/07/2023                HCT                      38.7 (L)            12/07/2023  MCV                      91.7                12/07/2023                PLT                      245                 12/07/2023              Anesthesia Other Findings   Reproductive/Obstetrics                              Anesthesia Physical Anesthesia Plan  ASA: 3  Anesthesia Plan: MAC   Post-op Pain Management:    Induction: Intravenous  PONV Risk Score and Plan: 1 and Ondansetron , Dexamethasone, Propofol  infusion, TIVA and Treatment may vary due to age or medical condition  Airway Management Planned: Natural Airway and Simple Face Mask  Additional Equipment:   Intra-op Plan:   Post-operative Plan:   Informed Consent: I have reviewed the patients History and Physical, chart, labs and discussed the procedure including the risks, benefits and alternatives for the proposed anesthesia with the patient or authorized representative who has indicated his/her understanding and acceptance.     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  Recently admitted 11/24 to 12/07/2023 after presenting with right sided visual changes. CT head was negative for stroke.  CTA head and neck suggestive of moderate right-sided vertebral artery stenosis. MRI of the brain with no acute intracranial abnormality.  Recent echo September 2025 showed EF 45-50%, grade 2 DD.  Neurology felt possible BRAO and recommended continue 81 mg ASA and have outpatient ophthalmology evaluation after discharge.  Patient was seen in follow-up by ophthalmologist Dr. Dingeldein and subsequently referred to vascular surgery for evaluation of possible temporal arteritis.   Given history of frequent PVCs, cardiac PET CT perfusion scan was ordered by cardiology to evaluate for ischemia.  Scan done 12/14/2023 showed normal perfusion, no evidence of ischemia or infarct.  BMP and CBC 12/07/2023 reviewed, creatinine 1.8 consistent with history of CKD 3, hypoalbuminemia with albumin  2.8, otherwise unremarkable.  Patient will need to have surgery evaluation.  EKG 12/05/2023: Sinus rhythm.  Rate 70.  PVC.  Low voltage, precordial leads.  Nonspecific T abnormalities, inferior leads.  Cardiac PET perfusion scan 12/14/2023:   LV perfusion is normal. There is no evidence of ischemia. There is no evidence of infarction.   Rest left ventricular function is abnormal. Rest global function is moderately reduced. There were no regional wall motion abnormalities. Rest EF: 33%. Stress left ventricular function is abnormal. Stress global function is moderately reduced. There were no regional wall abnormalities. Stress EF: 37%. End diastolic cavity size is mildly enlarged.   Myocardial blood flow was computed to be 0.57ml/g/min at rest and 1.36ml/g/min at stress. Global myocardial blood flow reserve was  2.16 and was normal.   Coronary calcium  was present on the attenuation correction CT images. Severe coronary calcifications  were present. Coronary calcifications were present in the left anterior descending artery, left circumflex artery and right coronary artery distribution(s).   The study is normal. The study is intermediate risk due to moderately reduced LVEF. No infarction or ischemia.   Electronically signed by Darryle Decent, MD  TTE 10/04/2023:  1. Left ventricular ejection fraction, by estimation, is 45 to 50%. The  left ventricle has mildly decreased function. The left ventricle  demonstrates global hypokinesis. There is moderate concentric left  ventricular hypertrophy. Left ventricular  diastolic parameters are consistent with Grade II diastolic dysfunction  (pseudonormalization).   2. Right ventricular systolic function is normal. The right ventricular  size is normal. There is moderately elevated pulmonary artery systolic  pressure.   3. Left atrial size was mildly dilated.   4. The mitral valve is grossly normal. Mild mitral valve regurgitation.  No evidence of mitral stenosis.   5. Decreased stroke volume index. DVI 0.35 and TFR 184. Valve visually  appears calcified with decrease motion and aortic stenosis may be  underestimated. Largest gradient seen in the apical window. The aortic  valve is calcified. Aortic valve  regurgitation is trivial. Mild to moderate aortic valve stenosis. Aortic  valve area, by VTI measures 1.09 cm. Aortic valve mean gradient measures  17.0 mmHg. Aortic valve Vmax measures 2.62 m/s. Aortic valve acceleration  time measures 84 msec.      )         Anesthesia Quick Evaluation

## 2023-12-27 ENCOUNTER — Ambulatory Visit (HOSPITAL_COMMUNITY): Payer: Self-pay | Admitting: Physician Assistant

## 2023-12-27 ENCOUNTER — Ambulatory Visit (HOSPITAL_COMMUNITY)
Admission: RE | Admit: 2023-12-27 | Discharge: 2023-12-27 | Disposition: A | Attending: Vascular Surgery | Admitting: Vascular Surgery

## 2023-12-27 ENCOUNTER — Encounter: Admission: RE | Disposition: A | Payer: Self-pay | Attending: Vascular Surgery

## 2023-12-27 DIAGNOSIS — H539 Unspecified visual disturbance: Secondary | ICD-10-CM | POA: Diagnosis present

## 2023-12-27 DIAGNOSIS — I080 Rheumatic disorders of both mitral and aortic valves: Secondary | ICD-10-CM | POA: Diagnosis not present

## 2023-12-27 DIAGNOSIS — Z79899 Other long term (current) drug therapy: Secondary | ICD-10-CM | POA: Diagnosis not present

## 2023-12-27 DIAGNOSIS — N1831 Chronic kidney disease, stage 3a: Secondary | ICD-10-CM

## 2023-12-27 DIAGNOSIS — Z8249 Family history of ischemic heart disease and other diseases of the circulatory system: Secondary | ICD-10-CM | POA: Diagnosis not present

## 2023-12-27 DIAGNOSIS — I509 Heart failure, unspecified: Secondary | ICD-10-CM | POA: Diagnosis not present

## 2023-12-27 DIAGNOSIS — Z7982 Long term (current) use of aspirin: Secondary | ICD-10-CM | POA: Insufficient documentation

## 2023-12-27 DIAGNOSIS — I130 Hypertensive heart and chronic kidney disease with heart failure and stage 1 through stage 4 chronic kidney disease, or unspecified chronic kidney disease: Secondary | ICD-10-CM | POA: Diagnosis not present

## 2023-12-27 DIAGNOSIS — Z87891 Personal history of nicotine dependence: Secondary | ICD-10-CM | POA: Insufficient documentation

## 2023-12-27 DIAGNOSIS — I493 Ventricular premature depolarization: Secondary | ICD-10-CM | POA: Diagnosis not present

## 2023-12-27 DIAGNOSIS — M316 Other giant cell arteritis: Secondary | ICD-10-CM

## 2023-12-27 DIAGNOSIS — I471 Supraventricular tachycardia, unspecified: Secondary | ICD-10-CM | POA: Insufficient documentation

## 2023-12-27 DIAGNOSIS — N183 Chronic kidney disease, stage 3 unspecified: Secondary | ICD-10-CM | POA: Diagnosis not present

## 2023-12-27 DIAGNOSIS — I5022 Chronic systolic (congestive) heart failure: Secondary | ICD-10-CM | POA: Insufficient documentation

## 2023-12-27 HISTORY — PX: ARTERY BIOPSY: SHX891

## 2023-12-27 LAB — POCT I-STAT, CHEM 8
BUN: 52 mg/dL — ABNORMAL HIGH (ref 8–23)
Calcium, Ion: 1.23 mmol/L (ref 1.15–1.40)
Chloride: 104 mmol/L (ref 98–111)
Creatinine, Ser: 2.1 mg/dL — ABNORMAL HIGH (ref 0.61–1.24)
Glucose, Bld: 82 mg/dL (ref 70–99)
HCT: 44 % (ref 39.0–52.0)
Hemoglobin: 15 g/dL (ref 13.0–17.0)
Potassium: 3.6 mmol/L (ref 3.5–5.1)
Sodium: 140 mmol/L (ref 135–145)
TCO2: 23 mmol/L (ref 22–32)

## 2023-12-27 LAB — SURGICAL PCR SCREEN
MRSA, PCR: NEGATIVE
Staphylococcus aureus: NEGATIVE

## 2023-12-27 SURGERY — BIOPSY TEMPORAL ARTERY
Anesthesia: Monitor Anesthesia Care | Site: Head | Laterality: Right

## 2023-12-27 MED ORDER — LACTATED RINGERS IV SOLN: INTRAVENOUS | Status: DC

## 2023-12-27 MED ORDER — SODIUM CHLORIDE 0.9 % IV SOLN: INTRAVENOUS | Status: DC

## 2023-12-27 MED ORDER — ORAL CARE MOUTH RINSE: 15.0000 mL | Freq: Once | OROMUCOSAL | Status: AC

## 2023-12-27 MED ORDER — FENTANYL CITRATE (PF) 100 MCG/2ML IJ SOLN
INTRAMUSCULAR | Status: AC
  Filled 2023-12-27: qty 2

## 2023-12-27 MED ORDER — LACTATED RINGERS IV SOLN: INTRAVENOUS | Status: DC | PRN

## 2023-12-27 MED ORDER — VANCOMYCIN HCL 1500 MG/300ML IV SOLN
1500.0000 mg | INTRAVENOUS | Status: AC
  Administered 2023-12-27: 08:00:00 1500 mg via INTRAVENOUS
  Filled 2023-12-27 (×2): qty 300

## 2023-12-27 MED ORDER — CHLORHEXIDINE GLUCONATE 4 % EX SOLN: 60.0000 mL | Freq: Once | CUTANEOUS | Status: DC

## 2023-12-27 MED ORDER — FENTANYL CITRATE (PF) 100 MCG/2ML IJ SOLN
INTRAMUSCULAR | Status: DC | PRN
  Administered 2023-12-27: 10:00:00 25 ug via INTRAVENOUS

## 2023-12-27 MED ORDER — LIDOCAINE HCL (PF) 1 % IJ SOLN
INTRAMUSCULAR | Status: DC | PRN
  Administered 2023-12-27: 11:00:00 19 mL

## 2023-12-27 MED ORDER — LIDOCAINE HCL (PF) 1 % IJ SOLN
INTRAMUSCULAR | Status: AC
  Filled 2023-12-27: qty 30

## 2023-12-27 MED ORDER — PHENYLEPHRINE HCL-NACL 20-0.9 MG/250ML-% IV SOLN
INTRAVENOUS | Status: DC | PRN
  Administered 2023-12-27: 10:00:00 10 ug/min via INTRAVENOUS

## 2023-12-27 MED ORDER — CHLORHEXIDINE GLUCONATE 0.12 % MT SOLN
15.0000 mL | Freq: Once | OROMUCOSAL | Status: AC
  Administered 2023-12-27: 08:00:00 15 mL via OROMUCOSAL
  Filled 2023-12-27: qty 15

## 2023-12-27 MED ORDER — PROPOFOL 500 MG/50ML IV EMUL
INTRAVENOUS | Status: DC | PRN
  Administered 2023-12-27: 10:00:00 50 ug/kg/min via INTRAVENOUS

## 2023-12-27 MED ORDER — PROPOFOL 10 MG/ML IV BOLUS
INTRAVENOUS | Status: DC | PRN
  Administered 2023-12-27: 10:00:00 20 mg via INTRAVENOUS

## 2023-12-27 SURGICAL SUPPLY — 35 items
BAG COUNTER SPONGE SURGICOUNT (BAG) ×1 IMPLANT
CANISTER SUCTION 3000ML PPV (SUCTIONS) ×1 IMPLANT
CLIP LIGATING EXTRA MED SLVR (CLIP) ×1 IMPLANT
CLIP LIGATING EXTRA SM BLUE (MISCELLANEOUS) ×1 IMPLANT
CNTNR URN SCR LID CUP LEK RST (MISCELLANEOUS) ×1 IMPLANT
COTTONBALL LRG STERILE PKG (GAUZE/BANDAGES/DRESSINGS) ×1 IMPLANT
COVER SURGICAL LIGHT HANDLE (MISCELLANEOUS) ×1 IMPLANT
DERMABOND ADVANCED .7 DNX12 (GAUZE/BANDAGES/DRESSINGS) ×1 IMPLANT
DRAPE OPHTHALMIC 77X100 STRL (CUSTOM PROCEDURE TRAY) ×1 IMPLANT
ELECT NDL BLADE 2-5/6 (NEEDLE) ×1 IMPLANT
ELECT NEEDLE BLADE 2-5/6 (NEEDLE) ×1 IMPLANT
ELECTRODE REM PT RTRN 9FT ADLT (ELECTROSURGICAL) ×1 IMPLANT
GAUZE 4X4 16PLY ~~LOC~~+RFID DBL (SPONGE) ×1 IMPLANT
GEL ULTRASOUND 8.5O AQUASONIC (MISCELLANEOUS) ×1 IMPLANT
GLOVE BIO SURGEON STRL SZ7.5 (GLOVE) ×1 IMPLANT
GOWN STRL REUS W/ TWL LRG LVL3 (GOWN DISPOSABLE) ×1 IMPLANT
GOWN STRL REUS W/ TWL XL LVL3 (GOWN DISPOSABLE) ×1 IMPLANT
KIT BASIN OR (CUSTOM PROCEDURE TRAY) ×1 IMPLANT
KIT TURNOVER KIT B (KITS) ×1 IMPLANT
NDL HYPO 25GX1X1/2 BEV (NEEDLE) ×1 IMPLANT
NEEDLE HYPO 25GX1X1/2 BEV (NEEDLE) ×1 IMPLANT
PACK GENERAL/GYN (CUSTOM PROCEDURE TRAY) ×1 IMPLANT
PAD ARMBOARD POSITIONER FOAM (MISCELLANEOUS) ×2 IMPLANT
POWDER SURGICEL 3.0 GRAM (HEMOSTASIS) IMPLANT
SOLN 0.9% NACL POUR BTL 1000ML (IV SOLUTION) ×1 IMPLANT
SOLN STERILE WATER BTL 1000 ML (IV SOLUTION) ×1 IMPLANT
SPIKE FLUID TRANSFER (MISCELLANEOUS) ×1 IMPLANT
SUCTION TUBE FRAZIER 10FR DISP (SUCTIONS) ×1 IMPLANT
SUT MNCRL AB 4-0 PS2 18 (SUTURE) ×1 IMPLANT
SUT PROLENE 6 0 BV (SUTURE) IMPLANT
SUT SILK 3-0 18XBRD TIE 12 (SUTURE) IMPLANT
SUT VIC AB 3-0 SH 27X BRD (SUTURE) ×1 IMPLANT
SYR CONTROL 10ML LL (SYRINGE) ×1 IMPLANT
TOWEL GREEN STERILE (TOWEL DISPOSABLE) ×1 IMPLANT
VASCULAR TIE MINI RED 18IN STL (MISCELLANEOUS) ×1 IMPLANT

## 2023-12-27 NOTE — Interval H&P Note (Signed)
 History and Physical Interval Note:  12/27/2023 7:22 AM  Christopher [name]  has presented today for surgery, with the diagnosis of temporal arteritis.  The various methods of treatment have been discussed with the patient and family. After consideration of risks, benefits and other options for treatment, the patient has consented to  Procedures: BIOPSY TEMPORAL ARTERY (Right) as a surgical intervention.  The patient's history has been reviewed, patient examined, no change in status, stable for surgery.  I have reviewed the patient's chart and labs.  Questions were answered to the patient's satisfaction.     [name]

## 2023-12-27 NOTE — Op Note (Addendum)
" ° ° °  Patient name: Christopher [name] MRN: [id number] DOB: 1928/11/29 Sex: male  12/27/2023 Pre-operative Diagnosis: Right visual changes, possible temporal arteritis Post-operative diagnosis:  Same Surgeon:  Penne BROCKS. Sheree, MD Procedure Performed: Right temporal artery biopsy  Indications: 89 year old male with concern for temporal arteritis.  We have discussed proceeding with temporal artery biopsy including the risk benefits alternatives and he agrees to proceed.   Findings: 4 cm segment of temporal artery was biopsied   Procedure:  The patient was identified in the holding area and taken to the operating room where he was placed supine operative and MAC anesthesia was induced.  He was sterilely prepped and draped in the right side of his head and clipped of hair, antibiotics were administered timeout was called.  We began using ultrasound to identify the artery.  The area was anesthetized 1% lidocaine .  A vertical incision was created overlying the mark on the skin.  We dissected down identified the artery dissected out proximally and distally.  I initially clamped it distally and transected it was noted to have antegrade pulsatile bleeding to confirm it was arterial.  Proximally clamped and tied off and divided and sent a 4 cm segment as biopsy.  Unfortunately the clamp was dislodged distally.  I did find the artery and placed the clip and also used cautery for hemostasis.  The wound was irrigated hemostasis was obtained we closed the skin with 4 Monocryl.  He was awakened from anesthesia having tolerated the procedure without Ameeth complication.  WERE correct at completion.  EBL: 20 cc  Harshan Kearley C. Sheree, MD [facility] Office: [phone] Pager: [phone]   "

## 2023-12-27 NOTE — Transfer of Care (Signed)
 Immediate Anesthesia Transfer of Care Note  Patient: Christopher [name]  Procedure(s) Performed: BIOPSY TEMPORAL ARTERY (Right: Head)  Patient Location: PACU  Anesthesia Type:MAC  Level of Consciousness: awake, alert , and oriented  Airway & Oxygen Therapy: Patient Spontanous Breathing and Patient connected to nasal cannula oxygen  Post-op Assessment: Report given to RN and Post -op Vital signs reviewed and stable  Post vital signs: Reviewed and stable  Last Vitals:  Vitals Value Taken Time  BP 125/79 12/27/23 11:15  Temp 36.4 C 12/27/23 11:00  Pulse 51 12/27/23 11:17  Resp 26 12/27/23 11:17  SpO2 96 % 12/27/23 11:17  Vitals shown include unfiled device data.  Last Pain:  Vitals:   12/27/23 1045  TempSrc:   PainSc: 0-No pain         Complications: No notable events documented.

## 2023-12-27 NOTE — Anesthesia Postprocedure Evaluation (Signed)
 Anesthesia Post Note  Patient: Junah Yam  Procedure(s) Performed: BIOPSY TEMPORAL ARTERY (Right: Head)     Patient location during evaluation: PACU Anesthesia Type: MAC Level of consciousness: awake Pain management: pain level controlled Vital Signs Assessment: post-procedure vital signs reviewed and stable Respiratory status: spontaneous breathing, nonlabored ventilation and respiratory function stable Cardiovascular status: stable and blood pressure returned to baseline Postop Assessment: no apparent nausea or vomiting Anesthetic complications: no   There were no known notable events for this encounter.  Last Vitals:  Vitals:   12/27/23 1100 12/27/23 1115  BP: 121/69 125/79  Pulse: (!) 53 (!) 50  Resp: 16 19  Temp: (!) 36.4 C   SpO2:  96%    Last Pain:  Vitals:   12/27/23 1045  TempSrc:   PainSc: 0-No pain                 Delon Aisha Arch

## 2023-12-28 ENCOUNTER — Encounter (HOSPITAL_COMMUNITY): Payer: Self-pay | Admitting: Vascular Surgery

## 2023-12-28 DIAGNOSIS — N1832 Chronic kidney disease, stage 3b: Secondary | ICD-10-CM | POA: Diagnosis not present

## 2023-12-28 LAB — SURGICAL PATHOLOGY

## 2024-02-03 ENCOUNTER — Ambulatory Visit: Attending: Emergency Medicine | Admitting: Emergency Medicine

## 2024-02-03 ENCOUNTER — Encounter: Payer: Self-pay | Admitting: Emergency Medicine

## 2024-02-03 VITALS — BP 98/60 | HR 69 | Ht 68.0 in | Wt 240.0 lb

## 2024-02-03 DIAGNOSIS — I493 Ventricular premature depolarization: Secondary | ICD-10-CM | POA: Insufficient documentation

## 2024-02-03 DIAGNOSIS — I272 Pulmonary hypertension, unspecified: Secondary | ICD-10-CM | POA: Insufficient documentation

## 2024-02-03 DIAGNOSIS — I872 Venous insufficiency (chronic) (peripheral): Secondary | ICD-10-CM | POA: Diagnosis present

## 2024-02-03 DIAGNOSIS — I2510 Atherosclerotic heart disease of native coronary artery without angina pectoris: Secondary | ICD-10-CM | POA: Insufficient documentation

## 2024-02-03 DIAGNOSIS — E785 Hyperlipidemia, unspecified: Secondary | ICD-10-CM | POA: Diagnosis present

## 2024-02-03 DIAGNOSIS — I350 Nonrheumatic aortic (valve) stenosis: Secondary | ICD-10-CM | POA: Diagnosis present

## 2024-02-03 DIAGNOSIS — I5022 Chronic systolic (congestive) heart failure: Secondary | ICD-10-CM | POA: Insufficient documentation

## 2024-02-03 DIAGNOSIS — I472 Ventricular tachycardia, unspecified: Secondary | ICD-10-CM | POA: Insufficient documentation

## 2024-02-03 NOTE — Patient Instructions (Addendum)
 Medication Instructions:  NO CHANGES  Lab Work: NONE TO BE DONE TODAY.  Testing/Procedures: NONE  Follow-Up: At [facility], you and your health needs are our priority.  As part of our continuing mission to provide you with exceptional heart care, our providers are all part of one team.  This team includes your primary Cardiologist (physician) and Advanced Practice Providers or APPs (Physician Assistants and Nurse Practitioners) who all work together to provide you with the care you need, when you need it.  Your next appointment:   6 MONTHS  Provider:   Wilbert Bihari, MD  OR Madison Founatin, DNP   Other Instructions:

## 2024-02-03 NOTE — Progress Notes (Signed)
 " Cardiology Office Note:    Date:  02/03/2024  ID:  Christopher [name], DOB [birth year], MRN [id number] PCP: [name] LABOR, MD  [facility] Providers Cardiologist:  Wilbert Bihari, MD Cardiology APP:  Rana Lum CROME, NP       Patient Profile:       Chief Complaint: 42-month follow-up History of Present Illness:  Christopher [name] is a 89 y.o. male with visit-pertinent history of obesity, CKD, prostate cancer, peripheral venous insufficiency s/p saphenous ablation, frequent PVCs, NSVT   On 9/20 to present to [facility] due to abnormal EKG seen in outpatient visit for wound care.  He was asymptomatic and noted to have frequent PVCs.  Troponin 43, 44.  He was asymptomatic and his workup was unremarkable.  He was discharged with cardiology follow-up.    He presented to the ED on 9/22 for worsening leg when.  Echocardiogram with LVEF 45 to 50% with mild global hypokinesis and moderate LVH, grade 2 DD, Mildly elevated PSAP, mildly dilated LA, mild MR, mild to moderate aortic stenosis though suspected to be underestimated.  He was admitted for cellulitis and started on IV antibiotics he established with cardiology service on 10/06/2023 in the hospital for evaluation of acute congestive heart failure.  Previously has not been seen by cardiologist.  In regards to his peripheral edema there has been a chronic issue since his radiation treatment for his prostate cancer.  He is mostly asymptomatic except for peripheral edema that had worsened beyond his baseline.  His proBNP is elevated but normal when age-adjusted.  Chest x-ray showed no evidence of volume overload.  His triamterene -HCTZ was placed on hold and he received IV Lasix  x 2 doses.  His creatinine peaked at 2.1 and further diuresis was held given elevated creatinine.  It was suspected his minimally reduced EF is potentially from ectopy burden or aortic stenosis.  His troponin elevation was thought to not be consistent with ACS and most  consistent with demand ischemia in the setting of cellulitis and AKI.  Outpatient monitor was placed and currently pending.   He was seen for follow-up on 11/03/2023.  He appeared euvolemic and well compensated on exam.  Lower extremity swelling improved with treatment of cellulitis.  He was entirely asymptomatic.  Avoiding ARB/ARNI/MRA and SGLT2i due to CKD.  Heart monitor results were still pending.   ZIO monitor on 10/06/2023 showed predominant rhythm was normal sinus rhythm with heart rate of 74 bpm.  There were 311 episodes of nonsustained ventricular tachycardia with the longest episode lasting 12 beats and fastest heart rate at 255 bpm.  There were 34 episodes of SVT consistent with nonsustained atrial tachycardia with the longest episode lasting 11.7 seconds with a max heart rate of 162 bpm.  Occasional PACs.  Frequent PVCs with PVC load of 20.8%.  He was started on metoprolol  25 mg daily.  He was seen back in clinic on 11/17/2023 given abnormal monitor.  He appeared euvolemic well compensated on exam.  GDMT was avoided given CKD.  He was started on metoprolol  succinate 25 mg daily for history of NSVT and PVCs.  He was also started on aspirin  for coronary artery calcification.  He underwent cardiac PET stress test on 12/14/2023 showing no evidence of ischemia or infarction.  He followed up with EP on 11/25/2023.  There was noted given his advanced age and lack of symptoms that the risk of antiarrhythmic drug therapy would outweigh the benefit.  He was continued on metoprolol  succinate 25  mg daily.   Discussed the use of AI scribe software for clinical note transcription with the patient, who gave verbal consent to proceed.  History of Present Illness Christopher [name] is a 89 year old male who presents for follow-up of his cardiac conditions.  Today he is doing well without acute cardiovascular concerns or complaints. He denies chest pain, shortness of breath, or palpitations with activity, including  during physical therapy, and has not needed to stop exercise due to cardiac symptoms.  About a month ago he developed an eye problem initially thought to be temporal arteritis, with inconclusive biopsy. His vision is 20/20 with improving eye alignment with recent appointment with ophthalmology, and he is awaiting neurology evaluation.  He takes a daily baby aspirin  and has noticed increased bruising. He takes magnesium  recommended by electrophysiology for heart rhythm. He tends to fall asleep quickly when sitting.  He is cautious with medications that may affect kidney function, and uses herbal kidney tea.  He denies chest pains, dyspnea, orthopnea, PND, palpitations, syncope, presyncope  Review of systems:  Please see the history of present illness. All other systems are reviewed and otherwise negative.      Studies Reviewed:    EKG Interpretation Date/Time:  Friday February 03 2024 10:56:53 EST Ventricular Rate:  69 PR Interval:  168 QRS Duration:  96 QT Interval:  400 QTC Calculation: 428 R Axis:   -18  Text Interpretation: Normal sinus rhythm Incomplete right bundle branch block When compared with ECG of 06-Dec-2023 00:43, PREVIOUS ECG IS PRESENT Confirmed by Rana Dixon [phone]) on 02/03/2024 11:51:07 AM    Cardiac PET stress 12/14/2023   LV perfusion is normal. There is no evidence of ischemia. There is no evidence of infarction.   Rest left ventricular function is abnormal. Rest global function is moderately reduced. There were no regional wall motion abnormalities. Rest EF: 33%. Stress left ventricular function is abnormal. Stress global function is moderately reduced. There were no regional wall abnormalities. Stress EF: 37%. End diastolic cavity size is mildly enlarged.   Myocardial blood flow was computed to be 0.81ml/g/min at rest and 1.36ml/g/min at stress. Global myocardial blood flow reserve was 2.16 and was normal.   Coronary calcium  was present on the attenuation  correction CT images. Severe coronary calcifications were present. Coronary calcifications were present in the left anterior descending artery, left circumflex artery and right coronary artery distribution(s).   The study is normal. The study is intermediate risk due to moderately reduced LVEF. No infarction or ischemia.   Electronically signed by Darryle Decent, MD  ZIO 11/08/2023   Predominant rhythm was normal sinus rhythm with average heart rate 74 bpm   311 episodes of nonsustained ventricular tachycardia with the longest episode lasting 12 beats and fastest heart rate 255 bpm   34 episodes of SVT consistent with nonsustained atrial tachycardia with the longest episode lasting 11.7 seconds and a max heart rate of 162 bpm   Occasional PACs, atrial couplets   Frequent PVCs with a PVC load of 20.8%, ventricular couplets and triplets as well as ventricular bigeminy and trigeminy     Patch Wear Time:  13 days and 23 hours (2025-10-05T20:08:31-0400 to 2025-10-19T20:08:23-0400)   Patient had a min HR of 50 bpm, max HR of 255 bpm, and avg HR of 74 bpm. Predominant underlying rhythm was Sinus Rhythm. Slight P wave morphology changes were noted. 311 Ventricular Tachycardia runs occurred, the run with the fastest interval lasting 6.4  secs with a max rate  of 255 bpm, the longest lasting 12 beats with an avg rate of 120 bpm. 34 Supraventricular Tachycardia runs occurred, the run with the fastest interval lasting 4 beats with a max rate of 162 bpm, the longest lasting 11.7 secs with an  avg rate of 130 bpm. True duration of Supraventricular Tachycardia difficult to ascertain due to artifact. Some episodes of Supraventricular Tachycardia may be possible Atrial Tachycardia with variable block. Isolated SVEs were occasional (1.4%, 20910),  SVE Couplets were rare (<1.0%, 1269), and no SVE Triplets were present. Isolated VEs were frequent (20.8%, [id number]), VE Couplets were occasional (3.7%, 72098), and VE Triplets  were rare (<1.0%, 1217). Ventricular Bigeminy and Trigeminy were present.   Echocardiogram 10/04/2023  1. Left ventricular ejection fraction, by estimation, is 45 to 50%. The  left ventricle has mildly decreased function. The left ventricle  demonstrates global hypokinesis. There is moderate concentric left  ventricular hypertrophy. Left ventricular  diastolic parameters are consistent with Grade II diastolic dysfunction  (pseudonormalization).   2. Right ventricular systolic function is normal. The right ventricular  size is normal. There is moderately elevated pulmonary artery systolic  pressure.   3. Left atrial size was mildly dilated.   4. The mitral valve is grossly normal. Mild mitral valve regurgitation.  No evidence of mitral stenosis.   5. Decreased stroke volume index. DVI 0.35 and TFR 184. Valve visually  appears calcified with decrease motion and aortic stenosis may be  underestimated. Largest gradient seen in the apical window. The aortic  valve is calcified. Aortic valve  regurgitation is trivial. Mild to moderate aortic valve stenosis. Aortic  valve area, by VTI measures 1.09 cm. Aortic valve mean gradient measures  17.0 mmHg. Aortic valve Vmax measures 2.62 m/s. Aortic valve acceleration  time measures 84 msec.   Risk Assessment/Calculations:              Physical Exam:   VS:  BP 98/60 (BP Location: Left Arm, Patient Position: Sitting, Cuff Size: Normal)   Pulse 69   Ht 5' 8 (1.727 m)   Wt 240 lb (108.9 kg)   BMI 36.49 kg/m    Wt Readings from Last 3 Encounters:  02/03/24 240 lb (108.9 kg)  12/27/23 238 lb (108 kg)  11/25/23 247 lb (112 kg)    GEN: Well nourished, well developed in no acute distress NECK: No JVD; No carotid bruits CARDIAC: RRR.  1/6 systolic murmur.  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  Chronic dependent edema is unchanged - Avoiding ARB/ARNI/MRA and SGLT2i given CKD with GFR of 33 and creatinine of 1.88 - His recent stress test was reassuring without evidence of ischemia, infarction, or wall motion abnormalities - Venous insufficiency is currently managed on triamterene -hydrochlorothiazide  75-50 mg daily - Continue metoprolol  XL 25 mg daily   Frequent PVCs ZIO 10/2023  showed frequent PVCs with a PVC load of 20.8% - Was evaluated by EP on 11/14 and was noted that given his advanced age, lack of symptoms that the risk of antiarrhythmic drug would outweigh the benefit - Today he remains asymptomatic without palpitations - Continue metoprolol  XL 25 mg daily - Continue magnesium  400 mg daily - Can follow-up with EP as needed    NSVT ZIO 10/2023 showed 311 episodes of nonsustained ventricular tachycardia with the longest episode lasting 12 beats and fastest heart rate of 255 bpm Cardiac PET 12/2023 was without evidence of ischemia or infarction - He was evaluated by EP and was noted given his advanced age, lack of symptoms that the risk of antiarrhythmic drugs would outweigh the benefit - He remains entirely asymptomatic without complaints of palpitations, tachycardia, syncope, presyncope, lightheadedness or dizziness - Exercises with PT routinely without any exertional symptoms - Continue metoprolol  XL 25 mg daily    Coronary artery disease Hyperlipidemia Cardiac PET 12/2023 without evidence of ischemia or infarction with severe 3V CAD LDL 93 on 11/2023 - EKG today without acute ischemic features - Today he is stable without chest pains or exertional symptoms.  Exercises with PT throughout the week without exertional chest pains or dyspnea.  Given reassuring PET scan and remaining asymptomatic no indication for further ischemic evaluation at this time - Continue aspirin  81 mg daily - He politely defers statin lowering therapy   Chronic venous insufficiency History of PVD insufficiency s/p saphenous vein ablation Has history since XRT for prostate cancer - Well-controlled - Managed on triamterene -hydrochlorothiazide  75-50 mg daily   Aortic stenosis Echocardiogram 09/2023 showing low-flow low gradient mild to moderate aortic valve stenosis with mean gradient 17 mmHg - Per [name] I personally reviewed 2D echo images and the valve is heavily calcified with reduced leaflet excursion. He definitely has at least moderate aortic stenosis visually. Although the mean gradient and V-max are in the mild range, DI and SVI are decreased. Findings most consistent with at least moderate aortic stenosis  - He remains asymptomatic without chest pains, dyspnea, syncope or any exertional symptoms  - He will need follow-up echocardiogram x 1 year for routine monitoring   CKD stage IIIb Creatinine 1.88 and GFR 33 on 11/2023 - Stable   Gout He is managed on allopurinol  and chronic prednisone  as outpatient - Management per PCP      Dispo:  Return in about 6 months (around 08/02/2024).  Signed, Lum LITTIE Louis, NP  "

## 2024-03-07 ENCOUNTER — Ambulatory Visit: Admitting: Podiatry

## 2024-06-18 ENCOUNTER — Ambulatory Visit: Admitting: Diagnostic Neuroimaging
